# Patient Record
Sex: Female | Born: 1990 | Race: Black or African American | Hispanic: No | Marital: Single | Smoking: Never smoker
Health system: Southern US, Community
[De-identification: ages and names within clinical notes are randomized; demographics above are authoritative.]

## PROBLEM LIST (undated history)

## (undated) ENCOUNTER — Emergency Department: Admission: EM | Payer: MEDICAID | Source: Ambulatory Visit

## (undated) DIAGNOSIS — G43909 Migraine, unspecified, not intractable, without status migrainosus: Secondary | ICD-10-CM

## (undated) DIAGNOSIS — Z9289 Personal history of other medical treatment: Secondary | ICD-10-CM

## (undated) DIAGNOSIS — D571 Sickle-cell disease without crisis: Secondary | ICD-10-CM

## (undated) DIAGNOSIS — D649 Anemia, unspecified: Secondary | ICD-10-CM

## (undated) DIAGNOSIS — O1490 Unspecified pre-eclampsia, unspecified trimester: Secondary | ICD-10-CM

## (undated) DIAGNOSIS — O24419 Gestational diabetes mellitus in pregnancy, unspecified control: Secondary | ICD-10-CM

## (undated) DIAGNOSIS — I509 Heart failure, unspecified: Secondary | ICD-10-CM

## (undated) HISTORY — DX: Heart failure, unspecified: I50.9

## (undated) HISTORY — DX: Personal history of other medical treatment: Z92.89

## (undated) HISTORY — DX: Anemia, unspecified: D64.9

## (undated) HISTORY — DX: Sickle-cell disease without crisis: D57.1

## (undated) HISTORY — DX: Migraine, unspecified, not intractable, without status migrainosus: G43.909

---

## 2008-07-23 ENCOUNTER — Encounter: Payer: Self-pay | Admitting: Cardiology

## 2009-10-19 ENCOUNTER — Ambulatory Visit: Payer: Self-pay | Admitting: Dermatology

## 2009-11-01 ENCOUNTER — Ambulatory Visit
Admit: 2009-11-01 | Discharge: 2009-11-01 | Disposition: A | Discharge status: Home / Self Care | Payer: Self-pay | Source: Ambulatory Visit | Admitting: Physician Assistant

## 2009-11-04 LAB — STREP A CULTURE, THROAT

## 2009-11-07 ENCOUNTER — Ambulatory Visit
Admit: 2009-11-07 | Discharge: 2009-11-07 | Disposition: A | Discharge status: Home / Self Care | Payer: Self-pay | Source: Ambulatory Visit | Admitting: Women's Health

## 2009-11-09 LAB — AEROBIC CULTURE

## 2009-11-14 LAB — CHLAMYDIA PLASMID DNA AMPLIFICATION: Chlamydia Plasmid DNA Amplification: DETECTED

## 2009-11-14 LAB — N. GONORRHOEAE DNA AMPLIFICATION: N. gonorrhoeae DNA Amplification: NOT DETECTED

## 2010-01-25 ENCOUNTER — Ambulatory Visit
Admit: 2010-01-25 | Discharge: 2010-01-25 | Disposition: A | Discharge status: Home / Self Care | Payer: Self-pay | Source: Ambulatory Visit | Attending: Women's Health | Admitting: Women's Health

## 2010-01-28 LAB — N. GONORRHOEAE DNA AMPLIFICATION: N. gonorrhoeae DNA Amplification: NOT DETECTED

## 2010-01-28 LAB — CHLAMYDIA PLASMID DNA AMPLIFICATION: Chlamydia Plasmid DNA Amplification: NOT DETECTED

## 2010-04-22 ENCOUNTER — Ambulatory Visit
Admit: 2010-04-22 | Discharge: 2010-04-22 | Disposition: A | Discharge status: Home / Self Care | Payer: Self-pay | Source: Ambulatory Visit | Attending: Women's Health | Admitting: Women's Health

## 2010-04-23 LAB — N. GONORRHOEAE DNA AMPLIFICATION: N. gonorrhoeae DNA Amplification: 0

## 2010-04-23 LAB — CHLAMYDIA PLASMID DNA AMPLIFICATION: Chlamydia Plasmid DNA Amplification: 0

## 2010-08-01 ENCOUNTER — Ambulatory Visit
Admit: 2010-08-01 | Discharge: 2010-08-01 | Disposition: A | Discharge status: Home / Self Care | Payer: Self-pay | Source: Ambulatory Visit | Attending: Women's Health | Admitting: Women's Health

## 2010-08-02 LAB — CHLAMYDIA PLASMID DNA AMPLIFICATION: Chlamydia Plasmid DNA Amplification: 0

## 2010-08-02 LAB — N. GONORRHOEAE DNA AMPLIFICATION: N. gonorrhoeae DNA Amplification: 0

## 2010-09-13 ENCOUNTER — Ambulatory Visit
Admit: 2010-09-13 | Discharge: 2010-09-13 | Disposition: A | Discharge status: Home / Self Care | Payer: Self-pay | Source: Ambulatory Visit | Attending: Women's Health | Admitting: Women's Health

## 2010-09-16 LAB — CHLAMYDIA PLASMID DNA AMPLIFICATION: Chlamydia Plasmid DNA Amplification: 0

## 2010-09-16 LAB — N. GONORRHOEAE DNA AMPLIFICATION: N. gonorrhoeae DNA Amplification: 0

## 2010-10-07 ENCOUNTER — Ambulatory Visit
Admit: 2010-10-07 | Discharge: 2010-10-07 | Disposition: A | Discharge status: Home / Self Care | Payer: Self-pay | Source: Ambulatory Visit | Attending: Women's Health | Admitting: Women's Health

## 2010-10-09 LAB — AEROBIC CULTURE: Aerobic Culture: 0

## 2010-11-06 ENCOUNTER — Ambulatory Visit
Admit: 2010-11-06 | Discharge: 2010-11-06 | Disposition: A | Discharge status: Home / Self Care | Payer: Self-pay | Source: Ambulatory Visit | Attending: Women's Health | Admitting: Women's Health

## 2010-11-07 LAB — AEROBIC CULTURE: Aerobic Culture: 0

## 2010-12-02 ENCOUNTER — Ambulatory Visit
Admit: 2010-12-02 | Discharge: 2010-12-02 | Disposition: A | Discharge status: Home / Self Care | Payer: Self-pay | Source: Ambulatory Visit | Attending: Family | Admitting: Family

## 2010-12-03 LAB — VAGINITIS SCREEN: DNA PROBE: Vaginitis Screen:DNA Probe: 0

## 2010-12-03 LAB — N. GONORRHOEAE DNA AMPLIFICATION: N. gonorrhoeae DNA Amplification: 0

## 2010-12-03 LAB — CHLAMYDIA PLASMID DNA AMPLIFICATION: Chlamydia Plasmid DNA Amplification: 0

## 2011-01-22 ENCOUNTER — Ambulatory Visit
Admit: 2011-01-22 | Discharge: 2011-01-22 | Disposition: A | Discharge status: Home / Self Care | Payer: Self-pay | Source: Ambulatory Visit | Attending: Women's Health | Admitting: Women's Health

## 2011-01-23 LAB — AEROBIC CULTURE: Aerobic Culture: 0

## 2011-06-24 ENCOUNTER — Emergency Department
Admission: EM | Admit: 2011-06-24 | Discharge status: Against Medical Advice | Payer: Self-pay | Source: Ambulatory Visit
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
## 2011-10-21 ENCOUNTER — Ambulatory Visit
Admit: 2011-10-21 | Discharge: 2011-10-21 | Disposition: A | Discharge status: Home / Self Care | Payer: Self-pay | Source: Ambulatory Visit | Attending: Urgent Care | Admitting: Urgent Care

## 2011-10-22 LAB — N. GONORRHOEAE DNA AMPLIFICATION: N. gonorrhoeae DNA Amplification: 0

## 2011-10-22 LAB — AEROBIC CULTURE: Aerobic Culture: 0

## 2011-10-22 LAB — VAGINITIS SCREEN: DNA PROBE: Vaginitis Screen:DNA Probe: 0

## 2011-10-22 LAB — CHLAMYDIA PLASMID DNA AMPLIFICATION: Chlamydia Plasmid DNA Amplification: 0

## 2011-12-19 ENCOUNTER — Ambulatory Visit
Admit: 2011-12-19 | Discharge: 2011-12-19 | Disposition: A | Discharge status: Home / Self Care | Payer: Self-pay | Source: Ambulatory Visit | Admitting: Midwife

## 2011-12-21 LAB — AEROBIC CULTURE

## 2012-01-05 ENCOUNTER — Other Ambulatory Visit: Payer: Self-pay | Admitting: Gastroenterology

## 2012-01-05 ENCOUNTER — Encounter: Payer: Self-pay | Admitting: Physician Assistant

## 2012-01-05 ENCOUNTER — Emergency Department
Admission: EM | Admit: 2012-01-05 | Disposition: A | Discharge status: Home / Self Care | Payer: Self-pay | Source: Ambulatory Visit

## 2012-01-05 LAB — POCT URINALYSIS DIPSTICK
Blood,UA POCT: NEGATIVE
Glucose,UA POCT: NORMAL
Ketones,UA POCT: NEGATIVE
Lot #: 21612002
Nitrite,UA POCT: NEGATIVE
PH,UA POCT: 7 (ref 5–8)

## 2012-01-05 LAB — POCT URINE PREGNANCY: Lot #: 211712

## 2012-01-05 MED ORDER — KETOROLAC TROMETHAMINE 30 MG/ML IJ SOLN *I*
30.0000 mg | Freq: Once | INTRAMUSCULAR | Status: AC
Start: 2012-01-05 — End: 2012-01-05
  Administered 2012-01-05: 30 mg via INTRAVENOUS

## 2012-01-05 MED ORDER — IBUPROFEN 800 MG PO TABS *I*
800.0000 mg | ORAL_TABLET | Freq: Three times a day (TID) | ORAL | Status: AC | PRN
Start: 2012-01-05 — End: 2012-02-04

## 2012-01-05 MED ORDER — KETOROLAC TROMETHAMINE 30 MG/ML IJ SOLN *I*
30.0000 mg | Freq: Once | INTRAMUSCULAR | Status: DC
Start: 2012-01-05 — End: 2012-01-05
  Filled 2012-01-05: qty 1

## 2012-01-05 MED ORDER — GUAIFENESIN 100 MG/5ML PO SYRP
200.0000 mg | ORAL_SOLUTION | Freq: Four times a day (QID) | ORAL | Status: AC | PRN
Start: 2012-01-05 — End: 2012-02-04

## 2012-01-10 LAB — EKG 12-LEAD
P: 44 degrees
QRS: 59 degrees
Rate: 63 {beats}/min
Severity: NORMAL
Severity: NORMAL
T: 55 degrees

## 2012-06-24 ENCOUNTER — Ambulatory Visit
Admit: 2012-06-24 | Discharge: 2012-06-24 | Disposition: A | Discharge status: Home / Self Care | Payer: Self-pay | Source: Ambulatory Visit | Attending: Midwife | Admitting: Midwife

## 2012-06-25 LAB — AEROBIC CULTURE: Aerobic Culture: 0

## 2012-06-25 LAB — N. GONORRHOEAE DNA AMPLIFICATION: N. gonorrhoeae DNA Amplification: 0

## 2012-06-25 LAB — CHLAMYDIA PLASMID DNA AMPLIFICATION: Chlamydia Plasmid DNA Amplification: 0

## 2012-08-30 ENCOUNTER — Ambulatory Visit
Admit: 2012-08-30 | Discharge: 2012-08-30 | Disposition: A | Discharge status: Home / Self Care | Payer: Self-pay | Source: Ambulatory Visit | Attending: Midwife | Admitting: Midwife

## 2012-08-31 LAB — AEROBIC CULTURE

## 2012-08-31 LAB — N. GONORRHOEAE DNA AMPLIFICATION: N. gonorrhoeae DNA Amplification: 0

## 2012-08-31 LAB — CHLAMYDIA PLASMID DNA AMPLIFICATION: Chlamydia Plasmid DNA Amplification: 0

## 2012-10-08 ENCOUNTER — Ambulatory Visit
Admit: 2012-10-08 | Discharge: 2012-10-08 | Disposition: A | Discharge status: Home / Self Care | Payer: Self-pay | Source: Ambulatory Visit | Attending: Family Medicine | Admitting: Family Medicine

## 2012-10-09 LAB — VAGINITIS SCREEN: DNA PROBE: Vaginitis Screen:DNA Probe: POSITIVE

## 2012-10-11 LAB — TRICHOMONAS CULTURE

## 2012-10-12 LAB — CHLAMYDIA PLASMID DNA AMPLIFICATION: Chlamydia Plasmid DNA Amplification: 0

## 2012-10-12 LAB — N. GONORRHOEAE DNA AMPLIFICATION: N. gonorrhoeae DNA Amplification: 0

## 2013-04-06 ENCOUNTER — Ambulatory Visit
Admit: 2013-04-06 | Discharge: 2013-04-06 | Disposition: A | Discharge status: Home / Self Care | Payer: Self-pay | Source: Ambulatory Visit | Attending: Pediatrics | Admitting: Pediatrics

## 2013-04-07 LAB — VAGINITIS SCREEN: DNA PROBE: Vaginitis Screen:DNA Probe: 0

## 2013-04-08 LAB — CHLAMYDIA PLASMID DNA AMPLIFICATION: Chlamydia Plasmid DNA Amplification: 0

## 2013-04-08 LAB — N. GONORRHOEAE DNA AMPLIFICATION: N. gonorrhoeae DNA Amplification: 0

## 2013-04-12 LAB — GYN CYTOLOGY

## 2013-06-10 ENCOUNTER — Ambulatory Visit
Admit: 2013-06-10 | Discharge: 2013-06-10 | Disposition: A | Discharge status: Home / Self Care | Payer: Self-pay | Source: Ambulatory Visit | Attending: Family Medicine | Admitting: Family Medicine

## 2013-06-14 LAB — N. GONORRHOEAE DNA AMPLIFICATION: N. gonorrhoeae DNA Amplification: 0

## 2013-06-14 LAB — CHLAMYDIA PLASMID DNA AMPLIFICATION: Chlamydia Plasmid DNA Amplification: 0

## 2013-09-20 ENCOUNTER — Ambulatory Visit
Admit: 2013-09-20 | Discharge: 2013-09-20 | Disposition: A | Discharge status: Home / Self Care | Payer: Self-pay | Source: Ambulatory Visit | Attending: Pediatrics | Admitting: Pediatrics

## 2013-09-21 LAB — N. GONORRHOEAE DNA AMPLIFICATION: N. gonorrhoeae DNA Amplification: 0

## 2013-09-21 LAB — CHLAMYDIA PLASMID DNA AMPLIFICATION: Chlamydia Plasmid DNA Amplification: 0

## 2013-10-28 ENCOUNTER — Ambulatory Visit
Admit: 2013-10-28 | Discharge: 2013-10-28 | Disposition: A | Discharge status: Home / Self Care | Payer: Self-pay | Source: Ambulatory Visit | Attending: Pediatrics | Admitting: Pediatrics

## 2013-10-29 LAB — SYPHILIS SCREEN
Syphilis Screen: NEGATIVE
Syphilis Status: NONREACTIVE

## 2013-10-29 LAB — VAGINITIS SCREEN: DNA PROBE: Vaginitis Screen:DNA Probe: 0

## 2013-10-29 LAB — HIV 1&2 ANTIGEN/ANTIBODY: HIV 1&2 ANTIGEN/ANTIBODY: NONREACTIVE

## 2013-10-31 LAB — CHLAMYDIA PLASMID DNA AMPLIFICATION: Chlamydia Plasmid DNA Amplification: 0

## 2013-10-31 LAB — N. GONORRHOEAE DNA AMPLIFICATION: N. gonorrhoeae DNA Amplification: 0

## 2013-11-02 LAB — GYN CYTOLOGY

## 2014-03-06 ENCOUNTER — Ambulatory Visit
Admit: 2014-03-06 | Discharge: 2014-03-06 | Disposition: A | Discharge status: Home / Self Care | Payer: Self-pay | Source: Ambulatory Visit | Admitting: Pediatrics

## 2014-03-07 LAB — VAGINITIS SCREEN: DNA PROBE: Vaginitis Screen:DNA Probe: POSITIVE

## 2014-03-08 LAB — N. GONORRHOEAE DNA AMPLIFICATION: N. gonorrhoeae DNA Amplification: 0

## 2014-03-08 LAB — CHLAMYDIA PLASMID DNA AMPLIFICATION: Chlamydia Plasmid DNA Amplification: 0

## 2014-04-24 ENCOUNTER — Ambulatory Visit
Admit: 2014-04-24 | Discharge: 2014-04-24 | Disposition: A | Discharge status: Home / Self Care | Payer: Self-pay | Source: Ambulatory Visit | Attending: Gastroenterology | Admitting: Gastroenterology

## 2014-04-25 LAB — N. GONORRHOEAE DNA AMPLIFICATION: N. gonorrhoeae DNA Amplification: 0

## 2014-04-25 LAB — AEROBIC CULTURE: Aerobic Culture: 0

## 2014-04-25 LAB — CHLAMYDIA PLASMID DNA AMPLIFICATION: Chlamydia Plasmid DNA Amplification: 0

## 2014-04-25 LAB — VAGINITIS SCREEN: DNA PROBE: Vaginitis Screen:DNA Probe: 0

## 2014-05-29 ENCOUNTER — Ambulatory Visit
Admit: 2014-05-29 | Discharge: 2014-05-29 | Disposition: A | Discharge status: Home / Self Care | Payer: Self-pay | Source: Ambulatory Visit | Attending: Pediatrics | Admitting: Pediatrics

## 2014-05-30 LAB — CHLAMYDIA PLASMID DNA AMPLIFICATION: Chlamydia Plasmid DNA Amplification: 0

## 2014-05-30 LAB — VAGINITIS SCREEN: DNA PROBE: Vaginitis Screen:DNA Probe: 0

## 2014-05-30 LAB — N. GONORRHOEAE DNA AMPLIFICATION: N. gonorrhoeae DNA Amplification: 0

## 2014-12-11 ENCOUNTER — Ambulatory Visit
Admit: 2014-12-11 | Discharge: 2014-12-11 | Disposition: A | Discharge status: Home / Self Care | Payer: Self-pay | Source: Ambulatory Visit | Attending: Nurse Practitioner | Admitting: Nurse Practitioner

## 2014-12-11 LAB — VAGINITIS SCREEN: DNA PROBE: Vaginitis Screen:DNA Probe: 0

## 2014-12-13 LAB — N. GONORRHOEAE DNA AMPLIFICATION: N. gonorrhoeae DNA Amplification: 0

## 2014-12-13 LAB — CHLAMYDIA PLASMID DNA AMPLIFICATION: Chlamydia Plasmid DNA Amplification: 0

## 2015-05-03 ENCOUNTER — Ambulatory Visit
Admit: 2015-05-03 | Discharge: 2015-05-03 | Disposition: A | Discharge status: Home / Self Care | Payer: Self-pay | Source: Ambulatory Visit | Attending: Family Medicine | Admitting: Family Medicine

## 2015-05-04 LAB — N. GONORRHOEAE DNA AMPLIFICATION: N. gonorrhoeae DNA Amplification: 0

## 2015-05-04 LAB — CHLAMYDIA PLASMID DNA AMPLIFICATION: Chlamydia Plasmid DNA Amplification: 0

## 2015-05-04 LAB — AEROBIC CULTURE: Aerobic Culture: 0

## 2015-06-12 ENCOUNTER — Ambulatory Visit
Admit: 2015-06-12 | Discharge: 2015-06-12 | Disposition: A | Discharge status: Home / Self Care | Payer: Self-pay | Source: Ambulatory Visit | Attending: Midwife | Admitting: Midwife

## 2015-06-13 LAB — CHLAMYDIA PLASMID DNA AMPLIFICATION: Chlamydia Plasmid DNA Amplification: 0

## 2015-06-13 LAB — N. GONORRHOEAE DNA AMPLIFICATION: N. gonorrhoeae DNA Amplification: 0

## 2015-08-16 ENCOUNTER — Ambulatory Visit
Admission: RE | Admit: 2015-08-16 | Discharge: 2015-08-16 | Disposition: A | Discharge status: Home / Self Care | Payer: Self-pay | Source: Ambulatory Visit | Attending: Obstetrics and Gynecology | Admitting: Obstetrics and Gynecology

## 2015-08-17 LAB — AEROBIC CULTURE: Aerobic Culture: 0

## 2015-09-17 ENCOUNTER — Ambulatory Visit
Admission: RE | Admit: 2015-09-17 | Discharge: 2015-09-17 | Disposition: A | Discharge status: Home / Self Care | Payer: Self-pay | Source: Ambulatory Visit | Attending: Obstetrics and Gynecology | Admitting: Obstetrics and Gynecology

## 2015-09-18 LAB — N. GONORRHOEAE DNA AMPLIFICATION: N. gonorrhoeae DNA Amplification: 0

## 2015-09-18 LAB — AEROBIC CULTURE: Aerobic Culture: 0

## 2015-09-18 LAB — VAGINITIS SCREEN: DNA PROBE: Vaginitis Screen:DNA Probe: POSITIVE

## 2015-09-18 LAB — CHLAMYDIA PLASMID DNA AMPLIFICATION: Chlamydia Plasmid DNA Amplification: 0

## 2015-09-18 LAB — TRICHOMONAS DNA AMPLIFICATION: Trichomonas DNA amplification: 0

## 2016-03-19 ENCOUNTER — Ambulatory Visit
Admission: RE | Admit: 2016-03-19 | Discharge: 2016-03-19 | Disposition: A | Discharge status: Home / Self Care | Payer: Self-pay | Source: Ambulatory Visit | Attending: Midwife | Admitting: Midwife

## 2016-03-20 LAB — CHLAMYDIA PLASMID DNA AMPLIFICATION: Chlamydia Plasmid DNA Amplification: 0

## 2016-03-20 LAB — VAGINITIS SCREEN: DNA PROBE
Vaginitis Screen:DNA Probe: POSITIVE — AB
Vaginitis Screen:DNA Probe: POSITIVE — AB

## 2016-03-20 LAB — N. GONORRHOEAE DNA AMPLIFICATION: N. gonorrhoeae DNA Amplification: 0

## 2016-03-20 LAB — TRICHOMONAS DNA AMPLIFICATION: Trichomonas DNA amplification: 0

## 2016-04-22 ENCOUNTER — Ambulatory Visit: Admit: 2016-04-22 | Discharge: 2016-04-22 | Disposition: A | Discharge status: Home / Self Care | Payer: Self-pay

## 2016-07-10 ENCOUNTER — Other Ambulatory Visit
Admission: RE | Admit: 2016-07-10 | Discharge: 2016-07-10 | Disposition: A | Discharge status: Home / Self Care | Payer: Self-pay | Source: Ambulatory Visit | Attending: Physician Assistant | Admitting: Physician Assistant

## 2016-07-11 LAB — N. GONORRHOEAE DNA AMPLIFICATION: N. gonorrhoeae DNA Amplification: 0

## 2016-07-11 LAB — VAGINITIS SCREEN: DNA PROBE: Vaginitis Screen:DNA Probe: 0

## 2016-07-11 LAB — CHLAMYDIA PLASMID DNA AMPLIFICATION: Chlamydia Plasmid DNA Amplification: 0

## 2016-07-15 ENCOUNTER — Encounter: Payer: Self-pay | Admitting: Gastroenterology

## 2016-07-28 ENCOUNTER — Encounter: Payer: Self-pay | Admitting: Surgery

## 2016-10-06 ENCOUNTER — Other Ambulatory Visit
Admission: RE | Admit: 2016-10-06 | Discharge: 2016-10-06 | Disposition: A | Discharge status: Home / Self Care | Payer: Self-pay | Source: Ambulatory Visit | Attending: Family Medicine | Admitting: Family Medicine

## 2016-10-06 LAB — VAGINITIS SCREEN: DNA PROBE: Vaginitis Screen:DNA Probe: POSITIVE — AB

## 2016-10-07 LAB — HEPATITIS B & C PROF
HBV Core Ab: NEGATIVE
HBV S Ab Quant: 0.99 m[IU]/mL
HBV S Ab: NEGATIVE
HBV S Ag: NEGATIVE
Hep C Ab: NEGATIVE

## 2016-10-07 LAB — SYPHILIS SCREEN
Syphilis Screen: NEGATIVE
Syphilis Status: NONREACTIVE

## 2016-10-07 LAB — HIV 1&2 ANTIGEN/ANTIBODY: HIV 1&2 ANTIGEN/ANTIBODY: NONREACTIVE

## 2016-10-07 LAB — N. GONORRHOEAE DNA AMPLIFICATION: N. gonorrhoeae DNA Amplification: 0

## 2016-10-07 LAB — TRICHOMONAS DNA AMPLIFICATION: Trichomonas DNA amplification: 0

## 2016-10-07 LAB — CHLAMYDIA PLASMID DNA AMPLIFICATION: Chlamydia Plasmid DNA Amplification: 0

## 2017-01-09 ENCOUNTER — Other Ambulatory Visit
Admission: RE | Admit: 2017-01-09 | Discharge: 2017-01-09 | Disposition: A | Discharge status: Home / Self Care | Payer: Self-pay | Source: Ambulatory Visit | Attending: Family Medicine | Admitting: Family Medicine

## 2017-01-12 LAB — CHLAMYDIA PLASMID DNA AMPLIFICATION: Chlamydia Plasmid DNA Amplification: 0

## 2017-01-12 LAB — N. GONORRHOEAE DNA AMPLIFICATION: N. gonorrhoeae DNA Amplification: 0

## 2017-01-12 LAB — TRICHOMONAS DNA AMPLIFICATION: Trichomonas DNA amplification: 0

## 2017-02-16 ENCOUNTER — Other Ambulatory Visit
Admission: RE | Admit: 2017-02-16 | Discharge: 2017-02-16 | Disposition: A | Discharge status: Home / Self Care | Payer: Medicaid Other | Source: Ambulatory Visit | Attending: Obstetrics and Gynecology | Admitting: Obstetrics and Gynecology

## 2017-02-16 DIAGNOSIS — Z113 Encounter for screening for infections with a predominantly sexual mode of transmission: Secondary | ICD-10-CM | POA: Insufficient documentation

## 2017-02-16 DIAGNOSIS — Z118 Encounter for screening for other infectious and parasitic diseases: Secondary | ICD-10-CM | POA: Insufficient documentation

## 2017-02-17 LAB — TRICHOMONAS DNA AMPLIFICATION: Trichomonas DNA amplification: 0

## 2017-02-17 LAB — N. GONORRHOEAE DNA AMPLIFICATION: N. gonorrhoeae DNA Amplification: 0

## 2017-02-17 LAB — CHLAMYDIA PLASMID DNA AMPLIFICATION: Chlamydia Plasmid DNA Amplification: 0

## 2017-04-09 ENCOUNTER — Other Ambulatory Visit
Admission: RE | Admit: 2017-04-09 | Discharge: 2017-04-09 | Disposition: A | Discharge status: Home / Self Care | Payer: Medicaid Other | Source: Ambulatory Visit | Attending: Emergency Medicine | Admitting: Emergency Medicine

## 2017-04-09 DIAGNOSIS — N760 Acute vaginitis: Secondary | ICD-10-CM | POA: Insufficient documentation

## 2017-04-10 LAB — AEROBIC CULTURE: Aerobic Culture: 0

## 2017-04-10 LAB — CHLAMYDIA PLASMID DNA AMPLIFICATION: Chlamydia Plasmid DNA Amplification: 0

## 2017-04-10 LAB — N. GONORRHOEAE DNA AMPLIFICATION: N. gonorrhoeae DNA Amplification: 0

## 2017-04-10 LAB — TRICHOMONAS DNA AMPLIFICATION: Trichomonas DNA amplification: 0

## 2017-05-05 ENCOUNTER — Other Ambulatory Visit
Admission: RE | Admit: 2017-05-05 | Discharge: 2017-05-05 | Disposition: A | Discharge status: Home / Self Care | Payer: Medicaid Other | Source: Ambulatory Visit | Attending: Midwife | Admitting: Midwife

## 2017-05-05 DIAGNOSIS — Z113 Encounter for screening for infections with a predominantly sexual mode of transmission: Secondary | ICD-10-CM | POA: Insufficient documentation

## 2017-05-05 DIAGNOSIS — Z118 Encounter for screening for other infectious and parasitic diseases: Secondary | ICD-10-CM | POA: Insufficient documentation

## 2017-05-07 LAB — CHLAMYDIA PLASMID DNA AMPLIFICATION: Chlamydia Plasmid DNA Amplification: 0

## 2017-05-07 LAB — N. GONORRHOEAE DNA AMPLIFICATION: N. gonorrhoeae DNA Amplification: 0

## 2017-05-08 LAB — TRICHOMONAS DNA AMPLIFICATION: Trichomonas DNA amplification: 0

## 2017-05-15 ENCOUNTER — Encounter: Payer: Self-pay | Admitting: Emergency Medicine

## 2017-05-15 ENCOUNTER — Emergency Department
Admission: EM | Admit: 2017-05-15 | Discharge: 2017-05-15 | Disposition: A | Discharge status: Home / Self Care | Payer: Medicaid Other | Source: Ambulatory Visit | Attending: Emergency Medicine | Admitting: Emergency Medicine

## 2017-05-15 ENCOUNTER — Emergency Department: Payer: Medicaid Other

## 2017-05-15 DIAGNOSIS — R2689 Other abnormalities of gait and mobility: Secondary | ICD-10-CM

## 2017-05-15 DIAGNOSIS — M79604 Pain in right leg: Secondary | ICD-10-CM | POA: Insufficient documentation

## 2017-05-15 DIAGNOSIS — Y998 Other external cause status: Secondary | ICD-10-CM | POA: Insufficient documentation

## 2017-05-15 DIAGNOSIS — Y92003 Bedroom of unspecified non-institutional (private) residence as the place of occurrence of the external cause: Secondary | ICD-10-CM | POA: Insufficient documentation

## 2017-05-15 DIAGNOSIS — M791 Myalgia: Secondary | ICD-10-CM

## 2017-05-15 DIAGNOSIS — W208XXA Other cause of strike by thrown, projected or falling object, initial encounter: Secondary | ICD-10-CM | POA: Insufficient documentation

## 2017-05-15 DIAGNOSIS — Y9389 Activity, other specified: Secondary | ICD-10-CM | POA: Insufficient documentation

## 2017-05-15 DIAGNOSIS — S8991XA Unspecified injury of right lower leg, initial encounter: Secondary | ICD-10-CM

## 2017-05-15 DIAGNOSIS — W19XXXA Unspecified fall, initial encounter: Secondary | ICD-10-CM

## 2017-05-15 MED ORDER — IBUPROFEN 400 MG PO TABS *I*
600.0000 mg | ORAL_TABLET | Freq: Once | ORAL | Status: AC
Start: 2017-05-15 — End: 2017-05-15
  Administered 2017-05-15: 600 mg via ORAL
  Filled 2017-05-15 (×2): qty 1

## 2017-05-15 NOTE — Discharge Instructions (Signed)
You were seen in the ED for R leg pain after a headboard fell on your leg.  Your exam is reassuring and your xrays show no fractures.  Use the cructhes as needed for pain.  Also use ibuprofen as needed for pain (info below).  Return to the ED if you have leg numbness or worsening pain.

## 2017-05-15 NOTE — ED Notes (Signed)
Assumed pt care from EMS at this time. Will continue to monitor and treat per MD orders.

## 2017-05-15 NOTE — ED Provider Notes (Addendum)
History     Chief Complaint   Patient presents with    Leg Injury     HPI Comments: Healthy 26 yo F presents with RLE pain after the headboard of the bed fell on her leg.  The headboard weights >100 lbs, but she was able to get it immediately off of her leg.  The board landed on the back of her lower R leg and she complains of stinging pain to the skin at the back of the calf and R calf soreness.  She denies swelling, numbness, and has taken no home medications for the pain. She states that the pain is worse with weight bearing and she can stand but cannot take more than a few steps. She denies skin injury.       History provided by:  Patient  Language interpreter used: No      History reviewed. No pertinent past medical history.     History reviewed. No pertinent surgical history.  History reviewed. No pertinent family history.    Social History    has no tobacco, alcohol, drug, and sexual activity history on file.    Living Situation     Questions Responses    Patient lives with Alone    Homeless     Caregiver for other family member     External Services     Employment     Domestic Violence Risk           Problem List   There is no problem list on file for this patient.      Review of Systems   Review of Systems   Constitutional: Negative for activity change, appetite change, chills, diaphoresis, fatigue, fever and unexpected weight change.   Respiratory: Negative for apnea, cough, choking, chest tightness, shortness of breath, wheezing and stridor.    Cardiovascular: Negative for chest pain, palpitations and leg swelling.   Musculoskeletal: Positive for gait problem and myalgias. Negative for arthralgias, back pain, joint swelling, neck pain and neck stiffness.   Skin: Negative for color change, pallor and rash.   Neurological: Negative for dizziness, tremors, seizures, syncope, facial asymmetry, speech difficulty, weakness, light-headedness, numbness and headaches.   Psychiatric/Behavioral: Negative for  agitation and confusion.       Physical Exam     ED Triage Vitals   BP Heart Rate Heart Rate (via Pulse Ox) Resp Temp Temp src SpO2 (Retired) O2 Device O2 Flow Rate   05/15/17 2005 05/15/17 2005 -- 05/15/17 2005 05/15/17 2005 05/15/17 2107 05/15/17 2005 -- --   110/68 74  16 37.4 C (99.3 F) TEMPORAL 98 %        Weight           05/15/17 2005           84.8 kg (187 lb)                    Physical Exam   Constitutional: She is oriented to person, place, and time. She appears well-nourished. No distress.   HENT:   Head: Normocephalic and atraumatic.   Eyes: Pupils are equal, round, and reactive to light.   Neck: Neck supple.   Cardiovascular: Normal rate, regular rhythm, normal heart sounds and intact distal pulses.    No murmur heard.  Pulmonary/Chest: Effort normal and breath sounds normal. No respiratory distress.   Abdominal: Soft. Bowel sounds are normal. She exhibits no distension.   Musculoskeletal:   RLE: no skin breakage to back of  leg, TTP of posterior calf, muscle compartments soft, sensation intact and 2+ DP pulse present  Normal ROM of ankle, knee, hip and no effusions, no thigh tenderness,     Patellar and achilles reflex 2+ bilaterally.   Neurological: She is alert and oriented to person, place, and time.   Skin: Skin is warm and dry.   Nursing note and vitals reviewed.      Medical Decision Making        Initial Evaluation:  ED First Provider Contact     Date/Time Event User Comments    05/15/17 2014 ED First Provider Contact [facility] Initial Face to Face Provider Contact          Patient seen by me as above    Assessment:  26 y.o.female comes to the ED with RLE pain.  Her exam is most consistent with soft tissue contusion.  Fracture less likely given no anterior tib/fib tenderness, but she is unable to ambulate. Compartment syndrome is unlikely given soft compartments and neurovascularly intact, and she had no prolonged time with weight on her leg.  Rhabdomyolysis unlikely given no prolonged  crush time.    Xray shows no fractures.  Pain slightly improved with ibuprofen, given crutches and discharged with PCP f/u.  Return precautionis of worsening pain or new leg numbness.    Differential Diagnosis includes contusion, fracture, less likely compartment syndrome, crush injury, rhabdomyolysis                       Plan:   Diagnostic: R tib/fib xray  Therapeutic: ibuprofen, crutches  Dispo: discharge  Carly Nilda CalamityAllison Loner, MD           Bess HarvestLoner, Carly Allison, MD  Resident  05/15/17 2141          Patient seen by me today, 05/15/2017 at 8:25 pm    History:   I reviewed this patient, reviewed the resident note and agree     Exam:    I examined this patient, reviewed the resident note and agree     Decision Making:   I discussed with the documented resident decision making and agree    Murrell ReddenAuthor Judithann Villamar, MD           Kathryne HitchLu, Tailynn Armetta, MD  05/15/17 2251

## 2017-05-15 NOTE — ED Notes (Signed)
Pt ambulatory with crutches, provided by Clinical research associatewriter. Pt tolerating PO intake. Vital signs stable. Pt discharge instructions reviewed. Pt comfortable with discharge planning and verbalizes understanding of discharge instruction. Pt will follow up with PCP. Pt belongings with pt. Pt safe to discharge at this time. Pt having a friend pick her up

## 2017-05-15 NOTE — ED Notes (Signed)
Pt back in room.

## 2017-05-15 NOTE — ED Triage Notes (Signed)
pt has right leg injury from heavy wooden head board falling on her right lower leg, cms intact, denies other injury.    Triage Note   Ashok NorrisBrian Kersten Salmons, RN

## 2017-05-15 NOTE — ED Notes (Signed)
Pt off the unit for imaging

## 2017-05-18 NOTE — Comprehensive Assessment (Signed)
Patient DOB:  09-19-1991    Patient Address:  [address]  [place] [id number]    Patient Phone:  [phone] (home) [phone] (work)    Patient Insurance:      Patient PCP:  Alric RanAtalla, Nashat Safy, MD    Patient Appointment Calendar  [email or web address] @    Intervention Status:  DSRIP Intervention status: (P) Unsuccessful outreach  DSRIP Patient?: (P) Yes  Patient address confirmation: (P) Yes  Patient phone confirmation: (P) Yes  Patient insurance confirmation: (P) Yes  Patient FCM referred?: (P) No  Appt type: (P) No PCP appt made  [facility] application complete?: (P) No  PAM completed?: (P) No  Time Spent with Patient (min): (P) 10    CHW called patient to make follow up appointment with a PCP.  CHW left message and have not heard back from patient at this time.    Priscella MannJason Marlo Arriola   [facility] [employer] Worker  [phone]

## 2017-06-02 ENCOUNTER — Other Ambulatory Visit
Admission: RE | Admit: 2017-06-02 | Discharge: 2017-06-02 | Disposition: A | Discharge status: Home / Self Care | Payer: Medicaid Other | Source: Ambulatory Visit | Attending: Midwife | Admitting: Midwife

## 2017-06-02 DIAGNOSIS — Z124 Encounter for screening for malignant neoplasm of cervix: Secondary | ICD-10-CM | POA: Insufficient documentation

## 2017-06-02 DIAGNOSIS — Z113 Encounter for screening for infections with a predominantly sexual mode of transmission: Secondary | ICD-10-CM | POA: Insufficient documentation

## 2017-06-02 DIAGNOSIS — Z118 Encounter for screening for other infectious and parasitic diseases: Secondary | ICD-10-CM | POA: Insufficient documentation

## 2017-06-04 ENCOUNTER — Other Ambulatory Visit: Payer: Self-pay

## 2017-06-04 DIAGNOSIS — Z3490 Encounter for supervision of normal pregnancy, unspecified, unspecified trimester: Secondary | ICD-10-CM

## 2017-06-04 LAB — N. GONORRHOEAE DNA AMPLIFICATION: N. gonorrhoeae DNA Amplification: 0

## 2017-06-04 LAB — GYN CYTOLOGY

## 2017-06-04 LAB — CHLAMYDIA PLASMID DNA AMPLIFICATION: Chlamydia Plasmid DNA Amplification: 0

## 2017-06-04 LAB — TRICHOMONAS DNA AMPLIFICATION: Trichomonas DNA amplification: 0

## 2017-06-05 ENCOUNTER — Ambulatory Visit: Payer: Medicaid Other

## 2017-08-11 ENCOUNTER — Encounter: Payer: Self-pay | Admitting: Family Medicine

## 2017-08-11 ENCOUNTER — Ambulatory Visit: Payer: Medicaid Other | Admitting: Family Medicine

## 2017-10-05 ENCOUNTER — Other Ambulatory Visit
Admission: RE | Admit: 2017-10-05 | Discharge: 2017-10-05 | Disposition: A | Discharge status: Home / Self Care | Payer: Medicaid Other | Source: Ambulatory Visit | Attending: Emergency Medicine | Admitting: Emergency Medicine

## 2017-10-05 DIAGNOSIS — Z0189 Encounter for other specified special examinations: Secondary | ICD-10-CM | POA: Insufficient documentation

## 2017-10-06 LAB — VAGINITIS SCREEN: DNA PROBE
Vaginitis Screen:DNA Probe: POSITIVE — AB
Vaginitis Screen:DNA Probe: POSITIVE — AB

## 2017-10-07 LAB — CHLAMYDIA PLASMID DNA AMPLIFICATION: Chlamydia Plasmid DNA Amplification: 0

## 2017-10-07 LAB — N. GONORRHOEAE DNA AMPLIFICATION: N. gonorrhoeae DNA Amplification: 0

## 2017-10-07 LAB — AEROBIC CULTURE

## 2017-10-07 LAB — TRICHOMONAS DNA AMPLIFICATION: Trichomonas DNA amplification: 0

## 2017-11-11 ENCOUNTER — Other Ambulatory Visit
Admission: RE | Admit: 2017-11-11 | Discharge: 2017-11-11 | Disposition: A | Discharge status: Home / Self Care | Payer: Medicaid Other | Source: Ambulatory Visit | Attending: Family Medicine | Admitting: Family Medicine

## 2017-11-11 DIAGNOSIS — Z113 Encounter for screening for infections with a predominantly sexual mode of transmission: Secondary | ICD-10-CM | POA: Insufficient documentation

## 2017-11-11 DIAGNOSIS — Z118 Encounter for screening for other infectious and parasitic diseases: Secondary | ICD-10-CM | POA: Insufficient documentation

## 2017-11-12 LAB — CHLAMYDIA PLASMID DNA AMPLIFICATION: Chlamydia Plasmid DNA Amplification: 0

## 2017-11-12 LAB — N. GONORRHOEAE DNA AMPLIFICATION: N. gonorrhoeae DNA Amplification: 0

## 2017-11-12 LAB — TRICHOMONAS DNA AMPLIFICATION: Trichomonas DNA amplification: 0

## 2017-11-19 ENCOUNTER — Other Ambulatory Visit: Payer: Self-pay

## 2017-11-19 DIAGNOSIS — R102 Pelvic and perineal pain: Secondary | ICD-10-CM

## 2017-11-20 ENCOUNTER — Ambulatory Visit: Payer: Medicaid Other

## 2018-01-01 ENCOUNTER — Other Ambulatory Visit
Admission: RE | Admit: 2018-01-01 | Discharge: 2018-01-01 | Disposition: A | Discharge status: Home / Self Care | Payer: MEDICAID | Source: Ambulatory Visit | Attending: Family Medicine | Admitting: Family Medicine

## 2018-01-01 DIAGNOSIS — Z114 Encounter for screening for human immunodeficiency virus [HIV]: Secondary | ICD-10-CM | POA: Insufficient documentation

## 2018-01-02 LAB — N. GONORRHOEAE DNA AMPLIFICATION: N. gonorrhoeae DNA Amplification: 0

## 2018-01-02 LAB — VAGINITIS SCREEN: DNA PROBE: Vaginitis Screen:DNA Probe: POSITIVE — AB

## 2018-01-02 LAB — CHLAMYDIA PLASMID DNA AMPLIFICATION: Chlamydia Plasmid DNA Amplification: 0

## 2018-01-06 ENCOUNTER — Other Ambulatory Visit
Admission: RE | Admit: 2018-01-06 | Discharge: 2018-01-06 | Disposition: A | Discharge status: Home / Self Care | Payer: MEDICAID | Source: Ambulatory Visit | Attending: Family Medicine | Admitting: Family Medicine

## 2018-01-06 DIAGNOSIS — N9419 Other specified dyspareunia: Secondary | ICD-10-CM | POA: Insufficient documentation

## 2018-01-06 LAB — CBC AND DIFFERENTIAL
Baso # K/uL: 0 10*3/uL (ref 0.0–0.1)
Basophil %: 0.3 %
Eos # K/uL: 0.2 10*3/uL (ref 0.0–0.4)
Eosinophil %: 2.5 %
Hematocrit: 39 % (ref 34–45)
Hemoglobin: 13 g/dL (ref 11.2–15.7)
IMM Granulocytes #: 0 10*3/uL
IMM Granulocytes: 0.3 %
Lymph # K/uL: 1.5 10*3/uL (ref 1.2–3.7)
Lymphocyte %: 24.4 %
MCH: 33 pg/cell — ABNORMAL HIGH (ref 26–32)
MCHC: 33 g/dL (ref 32–36)
MCV: 97 fL — ABNORMAL HIGH (ref 79–95)
Mono # K/uL: 0.6 10*3/uL (ref 0.2–0.9)
Monocyte %: 8.9 %
Neut # K/uL: 4 10*3/uL (ref 1.6–6.1)
Nucl RBC # K/uL: 0 10*3/uL (ref 0.0–0.0)
Nucl RBC %: 0 /100 WBC (ref 0.0–0.2)
Platelets: 236 10*3/uL (ref 160–370)
RBC: 4 MIL/uL (ref 3.9–5.2)
RDW: 12.2 % (ref 11.7–14.4)
Seg Neut %: 63.6 %
WBC: 6.3 10*3/uL (ref 4.0–10.0)

## 2018-01-06 LAB — COMPREHENSIVE METABOLIC PANEL
ALT: 16 U/L (ref 0–35)
AST: 20 U/L (ref 0–35)
Albumin: 4.2 g/dL (ref 3.5–5.2)
Alk Phos: 49 U/L (ref 35–105)
Anion Gap: 10 (ref 7–16)
Bilirubin,Total: 0.4 mg/dL (ref 0.0–1.2)
CO2: 27 mmol/L (ref 20–28)
Calcium: 9.1 mg/dL (ref 8.8–10.2)
Chloride: 104 mmol/L (ref 96–108)
Creatinine: 0.81 mg/dL (ref 0.51–0.95)
GFR,Black: 114 *
GFR,Caucasian: 99 *
Glucose: 89 mg/dL (ref 60–99)
Lab: 10 mg/dL (ref 6–20)
Potassium: 4.3 mmol/L (ref 3.3–5.1)
Sodium: 141 mmol/L (ref 133–145)
Total Protein: 7.6 g/dL (ref 6.3–7.7)

## 2018-01-06 LAB — TSH: TSH: 0.7 u[IU]/mL (ref 0.27–4.20)

## 2018-01-06 LAB — LUTEINIZING HORMONE: LH: 13 m[IU]/mL

## 2018-01-06 LAB — FOLLICLE STIMULATING HORMONE: FSH: 5.9 m[IU]/mL

## 2018-01-07 LAB — DHEA-SULFATE

## 2018-01-10 LAB — LC/MS F&T TESTOSTERONE
TESTOSTERONE,FREE: 0.49 ng/dL (ref 0.06–1.06)
Testosterone,LC-MS/MS: 29 ng/dL (ref 8–60)

## 2018-01-10 LAB — DHEA: DHEA: 3.22 ng/mL (ref 1.330–7.780)

## 2018-02-08 ENCOUNTER — Other Ambulatory Visit
Admission: RE | Admit: 2018-02-08 | Discharge: 2018-02-08 | Disposition: A | Discharge status: Home / Self Care | Payer: MEDICAID | Source: Ambulatory Visit | Attending: Obstetrics and Gynecology | Admitting: Obstetrics and Gynecology

## 2018-02-08 DIAGNOSIS — B379 Candidiasis, unspecified: Secondary | ICD-10-CM | POA: Insufficient documentation

## 2018-02-11 LAB — N. GONORRHOEAE DNA AMPLIFICATION: N. gonorrhoeae DNA Amplification: 0

## 2018-02-11 LAB — CHLAMYDIA PLASMID DNA AMPLIFICATION: Chlamydia Plasmid DNA Amplification: 0

## 2018-02-15 ENCOUNTER — Encounter: Payer: Self-pay | Admitting: Emergency Medicine

## 2018-02-15 ENCOUNTER — Emergency Department
Admission: EM | Admit: 2018-02-15 | Discharge: 2018-02-15 | Disposition: A | Discharge status: Home / Self Care | Payer: MEDICAID | Source: Ambulatory Visit | Attending: Emergency Medicine | Admitting: Emergency Medicine

## 2018-02-15 DIAGNOSIS — O26891 Other specified pregnancy related conditions, first trimester: Secondary | ICD-10-CM

## 2018-02-15 DIAGNOSIS — R109 Unspecified abdominal pain: Secondary | ICD-10-CM

## 2018-02-15 DIAGNOSIS — Z3A00 Weeks of gestation of pregnancy not specified: Secondary | ICD-10-CM

## 2018-02-15 DIAGNOSIS — R110 Nausea: Secondary | ICD-10-CM | POA: Insufficient documentation

## 2018-02-15 DIAGNOSIS — R102 Pelvic and perineal pain: Secondary | ICD-10-CM

## 2018-02-15 DIAGNOSIS — R1031 Right lower quadrant pain: Secondary | ICD-10-CM

## 2018-02-15 DIAGNOSIS — Z3201 Encounter for pregnancy test, result positive: Secondary | ICD-10-CM | POA: Insufficient documentation

## 2018-02-15 LAB — CBC AND DIFFERENTIAL
Baso # K/uL: 0 10*3/uL (ref 0.0–0.1)
Basophil %: 0.2 %
Eos # K/uL: 0.1 10*3/uL (ref 0.0–0.4)
Eosinophil %: 2.3 %
Hematocrit: 35 % (ref 34–45)
Hemoglobin: 12.6 g/dL (ref 11.2–15.7)
IMM Granulocytes #: 0 10*3/uL
IMM Granulocytes: 0.4 %
Lymph # K/uL: 1.7 10*3/uL (ref 1.2–3.7)
Lymphocyte %: 29.8 %
MCH: 33 pg — ABNORMAL HIGH (ref 26–32)
MCHC: 36 g/dL (ref 32–36)
MCV: 92 fL (ref 79–95)
Mono # K/uL: 0.6 10*3/uL (ref 0.2–0.9)
Monocyte %: 10.7 %
Neut # K/uL: 3.2 10*3/uL (ref 1.6–6.1)
Nucl RBC # K/uL: 0 10*3/uL (ref 0.0–0.0)
Nucl RBC %: 0 /100 WBC (ref 0.0–0.2)
Platelets: 225 10*3/uL (ref 160–370)
RBC: 3.9 MIL/uL (ref 3.9–5.2)
RDW: 12 % (ref 11.7–14.4)
Seg Neut %: 56.6 %
WBC: 5.7 10*3/uL (ref 4.0–10.0)

## 2018-02-15 LAB — POCT URINALYSIS DIPSTICK
Blood,UA POCT: NEGATIVE
Glucose,UA POCT: NORMAL mg/dL
Ketones,UA POCT: NEGATIVE mg/dL
Leuk Esterase,UA POCT: NEGATIVE
Lot #: 32726002
Nitrite,UA POCT: NEGATIVE
PH,UA POCT: 5 (ref 5–8)

## 2018-02-15 LAB — POCT URINE PREGNANCY: Preg Test,UR POC: POSITIVE — AB

## 2018-02-15 LAB — BASIC METABOLIC PANEL
Anion Gap: 11 (ref 7–16)
CO2: 22 mmol/L (ref 20–28)
Calcium: 9.3 mg/dL (ref 8.8–10.2)
Chloride: 104 mmol/L (ref 96–108)
Creatinine: 0.66 mg/dL (ref 0.51–0.95)
GFR,Black: 138 *
GFR,Caucasian: 120 *
Glucose: 83 mg/dL (ref 60–99)
Lab: 7 mg/dL (ref 6–20)
Potassium: 4.5 mmol/L (ref 3.3–5.1)
Sodium: 137 mmol/L (ref 133–145)

## 2018-02-15 LAB — CHLAMYDIA PLASMID DNA AMPLIFICATION: Chlamydia Plasmid DNA Amplification: 0

## 2018-02-15 LAB — BHCG, QUANT PREGNANCY: BHCG, QUANT PREGNANCY: 21007 m[IU]/mL — ABNORMAL HIGH (ref 0–1)

## 2018-02-15 LAB — VAGINITIS SCREEN: DNA PROBE: Vaginitis Screen:DNA Probe: 0

## 2018-02-15 LAB — N. GONORRHOEAE DNA AMPLIFICATION: N. gonorrhoeae DNA Amplification: 0

## 2018-02-15 MED ORDER — DEXTROSE 5 % FLUSH FOR PUMPS *I*
0.0000 mL/h | INTRAVENOUS | Status: DC | PRN
Start: 2018-02-15 — End: 2018-02-15

## 2018-02-15 MED ORDER — SODIUM CHLORIDE 0.9 % FLUSH FOR PUMPS *I*
0.0000 mL/h | INTRAVENOUS | Status: DC | PRN
Start: 2018-02-15 — End: 2018-02-15

## 2018-02-15 NOTE — ED Provider Notes (Addendum)
History     Chief Complaint   Patient presents with    Emesis     Pt to ED with c/o cramping "off and on" and emesis that started yesterday.  LMP beginning of feb.    Abdominal Cramping     27 year old healthy female presents with lower abdominal cramping, onset in past few days, comes and goes, cramping across lower abdomen most noted in RLQ. Reports mild lower back pain as well. Thinks she may be pregnant, unsure to recall exact date of LMP, possibly end of January, beginning of February. Reports negative home pregnancy test about 2 weeks ago. Denies vaginal discharge or bleeding. Denies dysuria, urgency or frequency with urination. No fever, chills or change in bowel habits. Does report mild nausea without emesis and mild breast tenderness.         History provided by:  Patient      Medical/Surgical/Family History     History reviewed. No pertinent past medical history.     There is no problem list on file for this patient.           History reviewed. No pertinent surgical history.  No family history on file.       Social History   Substance Use Topics    Smoking status: Not on file    Smokeless tobacco: Not on file    Alcohol use Not on file     Living Situation     Questions Responses    Patient lives with Alone    Homeless     Caregiver for other family member     External Services     Employment     Domestic Violence Risk                 Review of Systems   Review of Systems   Constitutional: Negative for chills, fatigue and fever.   Respiratory: Negative for cough and shortness of breath.    Cardiovascular: Negative for chest pain.   Gastrointestinal: Positive for nausea. Negative for diarrhea and vomiting.   Genitourinary: Positive for pelvic pain. Negative for difficulty urinating, dysuria, flank pain, frequency, hematuria, urgency, vaginal bleeding and vaginal discharge.   Musculoskeletal: Positive for back pain.   Skin: Negative for rash and wound.   Neurological: Negative for syncope and  headaches.   Psychiatric/Behavioral: Negative for agitation and confusion.       Physical Exam     Triage Vitals  Triage Start: Start, (02/15/18 0955)   First Recorded BP: 124/58, Resp: 16, Temp: 36.1 C (97 F), Temp src: TEMPORAL Oxygen Therapy SpO2: 100 %, O2 Device: None (Room air), Heart Rate: 76, (02/15/18 0955)  .  First Pain Reported  0-10 Scale: 8, Pain Location/Orientation: Abdomen, (02/15/18 1610)       Physical Exam   Constitutional: She is oriented to person, place, and time. She appears well-developed and well-nourished. No distress.   HENT:   Head: Normocephalic and atraumatic.   Cardiovascular: Normal rate, regular rhythm and normal heart sounds.    Pulmonary/Chest: Effort normal and breath sounds normal.   Abdominal: Soft. Bowel sounds are normal.   Abdomen soft, very mild tenderness in suprapubic region, no rebound or guarding    Genitourinary: Cervix exhibits no motion tenderness, no discharge and no friability. Vaginal discharge: scant white discharge.   Neurological: She is alert and oriented to person, place, and time.   Skin: She is not diaphoretic.   Psychiatric: She has a normal mood  and affect.       Medical Decision Making        Initial Evaluation:  ED First Provider Contact     Date/Time Event User Comments    02/15/18 1123 ED First Provider Contact Golden HurterGILLETT, BREEANA Initial Face to Face Provider Contact          Patient seen by me on arrival date of 02/15/2018.    Assessment:  27 y.o.female comes to the ED with Lower abdominal cramping, nausea,  possible pregnancy       Differential Diagnosis includes:  Lower abdominal cramping, nausea, pt with positive urine pregnancy test, reports pain more severe in RLQ than left, abdominal exam benign and no vaginal bleeding, rule out ectopic pregnancy    Plan: CBC, BMP, Beta HCG, GC/Chlamydia culture, vaginitis screen, reassess          Golden HurterBreeana Gillett, PA      Labs Reviewed   CBC AND DIFFERENTIAL - Abnormal; Notable for the following:        Result  Value    MCH 33 (*)     All other components within normal limits   BHCG,SERUM - Abnormal; Notable for the following:     BHCG 21,007 (*)     All other components within normal limits   POCT URINALYSIS DIPSTICK - Abnormal; Notable for the following:     Protein,UA POCT +++ 500mg /dL (*)     All other components within normal limits   POCT URINE PREGNANCY - Abnormal; Notable for the following:     Preg Test,UR POC Positive (*)     All other components within normal limits   CHLAMYDIA PLASMID DNA AMPLIFICATION   N. GONORRHOEAE DNA AMPLIFICATION   VAGINITIS SCREEN: DNA PROBE   BASIC METABOLIC PANEL     Bedside us performed by Dr. Tonny BollmanSchueckler with noted intrauterine yolk sak  Will discharge home, follow up with OB/GYN, return precautions discussed   APP Review:    I had face-to-face interaction with the patient on 02/15/2018.    I was asked by APP to see this patient due to the complexity of the current medical presentation.      I have reviewed and agree with the above documentation and, in addition:    Our plan is Discharge with follow up with OB/Gyn.Marland Kitchen.    Author:  Clayton LefortJOHN Nashia Remus, DO       Golden HurterGillett, Breeana, GeorgiaPA  02/15/18 1438       Alee Katen, Jonny RuizJohn, DO  02/16/18 1143

## 2018-02-15 NOTE — ED Notes (Addendum)
Please dismiss prior note as it was written in error on wrong patient. Initial assessment, pt is complaining of abd pain low and unk if she is pregnant, placed Iv and sent bloodwork. Pt is tolerating well.

## 2018-02-15 NOTE — ED Triage Notes (Signed)
Pt to ED with c/o cramping "off and on" and emesis that started yesterday.  LMP beginning of feb.       Triage Note   Precious BardKristin C Kindel, RN

## 2018-02-15 NOTE — Discharge Instructions (Signed)
If you have worsening symptoms, vaginal bleeding, fever or new concerns return to the ER    Call OB/GYN to schedule a follow up appointment

## 2018-02-16 NOTE — ED Procedure Documentation (Signed)
Procedure: Emergency Medicine Limited Pelvic Ultrasound    Procedure performed by Madison LefortJOHN Perlie Scheuring, Madison Rose    Date: 02/15/2018   Time: 1430    Type Transabdominal    Indications   Evaluation for the following: Evaluation for intrauterine pregnancy, abdominal pain    Findings:   Exam limitations include the following: No limitations    Structures visualized include the following: uterus, cul-de-sac and other IUP  Visualization of fluid: No pelvic free fluid and Other IUP  Additional findings: Gestational sac:  .    Impression  Definite IUP  .        Images were interpreted by me, Madison LefortJOHN Bryndan Bilyk, Madison Rose  Images were archived to [facility] PACSProcedures   Procedures    [facility] HospitalJOHN Armando Lauman, Madison Rose     Madison Rose, Madison Rose, Madison Rose  02/16/18 1146

## 2018-02-19 ENCOUNTER — Other Ambulatory Visit
Admission: RE | Admit: 2018-02-19 | Discharge: 2018-02-19 | Disposition: A | Discharge status: Home / Self Care | Payer: MEDICAID | Source: Ambulatory Visit | Attending: Midwife | Admitting: Midwife

## 2018-02-19 ENCOUNTER — Other Ambulatory Visit: Payer: Self-pay

## 2018-02-19 ENCOUNTER — Ambulatory Visit: Payer: MEDICAID | Attending: Midwife

## 2018-02-19 DIAGNOSIS — Z3687 Encounter for antenatal screening for uncertain dates: Secondary | ICD-10-CM | POA: Insufficient documentation

## 2018-02-19 DIAGNOSIS — O34211 Maternal care for low transverse scar from previous cesarean delivery: Secondary | ICD-10-CM

## 2018-02-19 DIAGNOSIS — N925 Other specified irregular menstruation: Secondary | ICD-10-CM | POA: Insufficient documentation

## 2018-02-19 DIAGNOSIS — Z3490 Encounter for supervision of normal pregnancy, unspecified, unspecified trimester: Secondary | ICD-10-CM

## 2018-02-19 LAB — TYPE AND SCREEN
ABO RH Blood Type: O POS
Antibody Screen: NEGATIVE

## 2018-02-20 LAB — HEPATITIS B & C PROF
HBV Core Ab: NEGATIVE
HBV S Ab Quant: 1.02 m[IU]/mL
HBV S Ab: NEGATIVE
HBV S Ag: NEGATIVE
Hep C Ab: NEGATIVE

## 2018-02-20 LAB — HIV 1&2 ANTIGEN/ANTIBODY: HIV 1&2 ANTIGEN/ANTIBODY: NONREACTIVE

## 2018-02-21 LAB — PRENATAL PROFILE
Baso # K/uL: 0 10*3/uL (ref 0.0–0.1)
Basophil %: 0.1 %
Eos # K/uL: 0.1 10*3/uL (ref 0.0–0.4)
Eosinophil %: 1.4 %
Hematocrit: 35 % (ref 34–45)
Hemoglobin: 11.5 g/dL (ref 11.2–15.7)
IMM Granulocytes #: 0 10*3/uL
IMM Granulocytes: 0.3 %
Lymph # K/uL: 1.9 10*3/uL (ref 1.2–3.7)
Lymphocyte %: 28.1 %
MCH: 32 pg/cell (ref 26–32)
MCHC: 33 g/dL (ref 32–36)
MCV: 96 fL — ABNORMAL HIGH (ref 79–95)
Mono # K/uL: 0.7 10*3/uL (ref 0.2–0.9)
Monocyte %: 10.1 %
Neut # K/uL: 4.1 10*3/uL (ref 1.6–6.1)
Nucl RBC # K/uL: 0 10*3/uL (ref 0.0–0.0)
Nucl RBC %: 0 /100 WBC (ref 0.0–0.2)
Platelets: 214 10*3/uL (ref 160–370)
RBC: 3.6 MIL/uL — ABNORMAL LOW (ref 3.9–5.2)
RDW: 11.9 % (ref 11.7–14.4)
Rubella IgG AB: UNDETERMINED
Seg Neut %: 60 %
Syphilis Screen: NEGATIVE
Syphilis Status: NONREACTIVE
WBC: 6.9 10*3/uL (ref 4.0–10.0)

## 2018-02-22 LAB — LEAD VENOUS: Lead,Venous: <1 ug/dl (ref 0–5)

## 2018-02-22 LAB — LEAD, BLOOD

## 2018-06-23 LAB — UNMAPPED LAB RESULTS
Basophil # (HT): 0 10 3/uL — NL (ref 0.0–0.2)
Basophil % (HT): 0 % — NL (ref 0–3)
Eosinophil # (HT): 0 10 3/uL — NL (ref 0.0–0.6)
Eosinophil % (HT): 0 % — NL (ref 0–5)
Hematocrit (HT): 37 % — NL (ref 35–47)
Hemoglobin (HGB) (HT): 12.7 g/dL — NL (ref 12.0–16.0)
Lymphocyte # (HT): 0.8 10 3/uL — ABNORMAL LOW (ref 1.0–4.8)
Lymphocyte % (HT): 11 % — ABNORMAL LOW (ref 15–45)
MCHC (HT): 34.1 g/dL — NL (ref 31.0–37.5)
MCV (HT): 93 fL — NL (ref 80–100)
Mean Corpuscular Hemoglobin (MCH) (HT): 31.8 pg — NL (ref 26.0–34.0)
Monocyte # (HT): 0.2 10 3/uL — NL (ref 0.1–1.0)
Monocyte % (HT): 3 % — NL (ref 0–15)
Neutrophil # (HT): 5.9 10 3/uL — NL (ref 1.8–8.0)
Platelets (HT): 240 10 3/uL — NL (ref 150–450)
RBC (HT): 4 10 6/uL — NL (ref 3.80–5.20)
RDW (HT): 12.1 % — NL (ref 0.0–15.2)
Seg Neut % (HT): 85 % — ABNORMAL HIGH (ref 45–75)
WBC (HT): 7 10 3/uL — NL (ref 4.0–11.0)

## 2018-06-30 ENCOUNTER — Emergency Department (HOSPITAL_COMMUNITY)
Admission: EM | Admit: 2018-06-30 | Discharge: 2018-06-30 | Disposition: A | Discharge status: Home / Self Care | Payer: No Typology Code available for payment source | Attending: Emergency Medicine | Admitting: Emergency Medicine

## 2018-06-30 ENCOUNTER — Encounter (HOSPITAL_COMMUNITY): Payer: Self-pay | Admitting: Emergency Medicine

## 2018-06-30 DIAGNOSIS — Y999 Unspecified external cause status: Secondary | ICD-10-CM | POA: Diagnosis not present

## 2018-06-30 DIAGNOSIS — M7918 Myalgia, other site: Secondary | ICD-10-CM | POA: Insufficient documentation

## 2018-06-30 DIAGNOSIS — Y92410 Unspecified street and highway as the place of occurrence of the external cause: Secondary | ICD-10-CM | POA: Diagnosis not present

## 2018-06-30 LAB — POC URINE PREG, ED: Preg Test, Ur: NEGATIVE

## 2018-06-30 MED ORDER — CYCLOBENZAPRINE HCL 10 MG PO TABS: 10.0000 mg | ORAL_TABLET | Freq: Two times a day (BID) | ORAL | 0 refills | Status: DC | PRN

## 2018-06-30 MED ORDER — IBUPROFEN 600 MG PO TABS: 600.0000 mg | ORAL_TABLET | Freq: Four times a day (QID) | ORAL | 0 refills | Status: DC | PRN
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
## 2018-09-20 ENCOUNTER — Other Ambulatory Visit
Admission: RE | Admit: 2018-09-20 | Discharge: 2018-09-20 | Disposition: A | Discharge status: Home / Self Care | Payer: MEDICAID | Source: Ambulatory Visit | Attending: Physician Assistant | Admitting: Physician Assistant

## 2018-09-20 DIAGNOSIS — D573 Sickle-cell trait: Secondary | ICD-10-CM | POA: Insufficient documentation

## 2018-09-20 LAB — COMPREHENSIVE METABOLIC PANEL
ALT: 10 U/L (ref 0–35)
AST: 18 U/L (ref 0–35)
Albumin: 4.3 g/dL (ref 3.5–5.2)
Alk Phos: 54 U/L (ref 35–105)
Anion Gap: 10 (ref 7–16)
Bilirubin,Total: 0.3 mg/dL (ref 0.0–1.2)
CO2: 24 mmol/L (ref 20–28)
Calcium: 9.4 mg/dL (ref 8.8–10.2)
Chloride: 108 mmol/L (ref 96–108)
Creatinine: 0.81 mg/dL (ref 0.51–0.95)
GFR,Black: 113 *
GFR,Caucasian: 98 *
Glucose: 89 mg/dL (ref 60–99)
Lab: 7 mg/dL (ref 6–20)
Potassium: 4.7 mmol/L (ref 3.3–5.1)
Sodium: 142 mmol/L (ref 133–145)
Total Protein: 7.3 g/dL (ref 6.3–7.7)

## 2018-09-20 LAB — CBC AND DIFFERENTIAL
Baso # K/uL: 0 10*3/uL (ref 0.0–0.1)
Basophil %: 0.4 %
Eos # K/uL: 0.3 10*3/uL (ref 0.0–0.4)
Eosinophil %: 4.9 %
Hematocrit: 40 % (ref 34–45)
Hemoglobin: 12.9 g/dL (ref 11.2–15.7)
IMM Granulocytes #: 0 10*3/uL
IMM Granulocytes: 0.4 %
Lymph # K/uL: 1.9 10*3/uL (ref 1.2–3.7)
Lymphocyte %: 36 %
MCH: 32 pg/cell (ref 26–32)
MCHC: 33 g/dL (ref 32–36)
MCV: 98 fL — ABNORMAL HIGH (ref 79–95)
Mono # K/uL: 0.5 10*3/uL (ref 0.2–0.9)
Monocyte %: 10 %
Neut # K/uL: 2.6 10*3/uL (ref 1.6–6.1)
Nucl RBC # K/uL: 0 10*3/uL (ref 0.0–0.0)
Nucl RBC %: 0 /100 WBC (ref 0.0–0.2)
Platelets: 235 10*3/uL (ref 160–370)
RBC: 4.1 MIL/uL (ref 3.9–5.2)
RDW: 11.9 % (ref 11.7–14.4)
Seg Neut %: 48.3 %
WBC: 5.3 10*3/uL (ref 4.0–10.0)

## 2018-11-16 ENCOUNTER — Other Ambulatory Visit
Admission: RE | Admit: 2018-11-16 | Discharge: 2018-11-16 | Disposition: A | Discharge status: Home / Self Care | Payer: MEDICAID | Source: Ambulatory Visit | Attending: Family Medicine | Admitting: Family Medicine

## 2018-11-16 DIAGNOSIS — Z7251 High risk heterosexual behavior: Secondary | ICD-10-CM | POA: Insufficient documentation

## 2018-11-16 DIAGNOSIS — Z118 Encounter for screening for other infectious and parasitic diseases: Secondary | ICD-10-CM | POA: Insufficient documentation

## 2018-11-16 DIAGNOSIS — Z113 Encounter for screening for infections with a predominantly sexual mode of transmission: Secondary | ICD-10-CM | POA: Insufficient documentation

## 2018-11-17 LAB — CHLAMYDIA PLASMID DNA AMPLIFICATION: Chlamydia Plasmid DNA Amplification: 0

## 2018-11-17 LAB — TRICHOMONAS DNA AMPLIFICATION: Trichomonas DNA amplification: 0

## 2018-11-17 LAB — N. GONORRHOEAE DNA AMPLIFICATION: N. gonorrhoeae DNA Amplification: 0

## 2018-12-15 NOTE — L&D Delivery Note (Signed)
Delivery Note Pt progressed to complete and pushed well.  At 3:08 AM a viable female was delivered via Vaginal, Spontaneous (Presentation: vtx; ROA ).  APGAR: 7, 9; weight pending.   Placenta status: spontaneous, intact.  Cord:  with the following complications: none.   Anesthesia:  Epidural Episiotomy: None Lacerations: None;1st degree Suture Repair: 3.0 vicryl rapide Est. Blood Loss (mL):  100  Mom to postpartum.  Baby to Couplet care / Skin to Skin.  Will continue magnesium for around 24 hours  Clarene Duke 08/13/2019, 3:29 AM

## 2019-02-16 DIAGNOSIS — Z3490 Encounter for supervision of normal pregnancy, unspecified, unspecified trimester: Secondary | ICD-10-CM | POA: Insufficient documentation

## 2019-02-17 LAB — OB RESULTS CONSOLE RPR: RPR: NONREACTIVE

## 2019-02-17 LAB — OB RESULTS CONSOLE GC/CHLAMYDIA
Chlamydia: NEGATIVE
Gonorrhea: NEGATIVE

## 2019-02-17 LAB — OB RESULTS CONSOLE ANTIBODY SCREEN: Antibody Screen: NEGATIVE

## 2019-02-17 LAB — OB RESULTS CONSOLE RUBELLA ANTIBODY, IGM: Rubella: IMMUNE

## 2019-02-17 LAB — OB RESULTS CONSOLE ABO/RH: RH Type: POSITIVE

## 2019-02-17 LAB — OB RESULTS CONSOLE HIV ANTIBODY (ROUTINE TESTING): HIV: NONREACTIVE

## 2019-02-17 LAB — OB RESULTS CONSOLE HEPATITIS B SURFACE ANTIGEN: Hepatitis B Surface Ag: NEGATIVE

## 2019-05-26 DIAGNOSIS — O09293 Supervision of pregnancy with other poor reproductive or obstetric history, third trimester: Secondary | ICD-10-CM | POA: Diagnosis not present

## 2019-05-26 DIAGNOSIS — Z3A27 27 weeks gestation of pregnancy: Secondary | ICD-10-CM | POA: Diagnosis not present

## 2019-05-31 ENCOUNTER — Other Ambulatory Visit: Payer: Self-pay | Admitting: Emergency Medicine

## 2019-06-09 ENCOUNTER — Encounter: Payer: Self-pay | Admitting: Registered"

## 2019-06-09 ENCOUNTER — Encounter: Payer: Medicaid Other | Attending: Obstetrics and Gynecology | Admitting: Registered"

## 2019-06-09 ENCOUNTER — Other Ambulatory Visit: Payer: Self-pay

## 2019-06-09 DIAGNOSIS — O99810 Abnormal glucose complicating pregnancy: Secondary | ICD-10-CM

## 2019-06-09 NOTE — Progress Notes (Signed)
Patient was seen on 06/09/2019 for Gestational Diabetes self-management class at the [facility]. The following learning objectives were met by the patient during this course:   States the definition of Gestational Diabetes  States why dietary management is important in controlling blood glucose  Describes the effects each nutrient has on blood glucose levels  Demonstrates ability to create a balanced meal plan  Demonstrates carbohydrate counting   States when to check blood glucose levels  Demonstrates proper blood glucose monitoring techniques  States the effect of stress and exercise on blood glucose levels  States the importance of limiting caffeine and abstaining from alcohol and smoking  Blood glucose monitor given: Accu-chek Guide me Lot # [id number] Exp: 06/19/2020 Blood glucose reading: 66 mg/dL  Patient instructed to monitor glucose levels: FBS: 60 - <95; 1 hour: <140; 2 hour: <120  Patient received handouts:  Nutrition Diabetes and Pregnancy, including carb counting list  Patient will be seen for follow-up as needed.

## 2019-07-27 DIAGNOSIS — Z3A36 36 weeks gestation of pregnancy: Secondary | ICD-10-CM | POA: Diagnosis not present

## 2019-07-27 DIAGNOSIS — N3000 Acute cystitis without hematuria: Secondary | ICD-10-CM | POA: Diagnosis not present

## 2019-07-27 DIAGNOSIS — Z3403 Encounter for supervision of normal first pregnancy, third trimester: Secondary | ICD-10-CM | POA: Diagnosis not present

## 2019-07-28 LAB — OB RESULTS CONSOLE GBS: GBS: NEGATIVE

## 2019-08-11 ENCOUNTER — Inpatient Hospital Stay (HOSPITAL_COMMUNITY)
Admission: AD | Admit: 2019-08-11 | Discharge: 2019-08-16 | DRG: 807 | Disposition: A | Discharge status: Home / Self Care | Payer: No Typology Code available for payment source | Attending: Obstetrics and Gynecology | Admitting: Obstetrics and Gynecology

## 2019-08-11 ENCOUNTER — Encounter (HOSPITAL_COMMUNITY): Payer: Self-pay | Admitting: *Deleted

## 2019-08-11 ENCOUNTER — Other Ambulatory Visit: Payer: Self-pay

## 2019-08-11 DIAGNOSIS — O1493 Unspecified pre-eclampsia, third trimester: Secondary | ICD-10-CM | POA: Diagnosis present

## 2019-08-11 DIAGNOSIS — O1414 Severe pre-eclampsia complicating childbirth: Secondary | ICD-10-CM | POA: Diagnosis present

## 2019-08-11 DIAGNOSIS — O114 Pre-existing hypertension with pre-eclampsia, complicating childbirth: Principal | ICD-10-CM | POA: Diagnosis present

## 2019-08-11 DIAGNOSIS — O24429 Gestational diabetes mellitus in childbirth, unspecified control: Secondary | ICD-10-CM | POA: Diagnosis not present

## 2019-08-11 DIAGNOSIS — O24420 Gestational diabetes mellitus in childbirth, diet controlled: Secondary | ICD-10-CM | POA: Diagnosis present

## 2019-08-11 DIAGNOSIS — R000 Tachycardia, unspecified: Secondary | ICD-10-CM | POA: Diagnosis present

## 2019-08-11 DIAGNOSIS — O1002 Pre-existing essential hypertension complicating childbirth: Secondary | ICD-10-CM | POA: Diagnosis present

## 2019-08-11 DIAGNOSIS — O99214 Obesity complicating childbirth: Secondary | ICD-10-CM | POA: Diagnosis present

## 2019-08-11 DIAGNOSIS — Z3A38 38 weeks gestation of pregnancy: Secondary | ICD-10-CM | POA: Diagnosis not present

## 2019-08-11 DIAGNOSIS — O9989 Other specified diseases and conditions complicating pregnancy, childbirth and the puerperium: Secondary | ICD-10-CM | POA: Diagnosis present

## 2019-08-11 DIAGNOSIS — Z20828 Contact with and (suspected) exposure to other viral communicable diseases: Secondary | ICD-10-CM | POA: Diagnosis present

## 2019-08-11 DIAGNOSIS — O1494 Unspecified pre-eclampsia, complicating childbirth: Secondary | ICD-10-CM | POA: Diagnosis not present

## 2019-08-11 HISTORY — DX: Unspecified pre-eclampsia, unspecified trimester: O14.90

## 2019-08-11 HISTORY — DX: Gestational diabetes mellitus in pregnancy, unspecified control: O24.419

## 2019-08-11 LAB — COMPREHENSIVE METABOLIC PANEL
ALT: 15 U/L (ref 0–44)
AST: 24 U/L (ref 15–41)
Albumin: 2.9 g/dL — ABNORMAL LOW (ref 3.5–5.0)
Alkaline Phosphatase: 121 U/L (ref 38–126)
Anion gap: 10 (ref 5–15)
BUN: 8 mg/dL (ref 6–20)
CO2: 21 mmol/L — ABNORMAL LOW (ref 22–32)
Calcium: 9.8 mg/dL (ref 8.9–10.3)
Chloride: 105 mmol/L (ref 98–111)
Creatinine, Ser: 0.7 mg/dL (ref 0.44–1.00)
GFR calc Af Amer: >60 mL/min (ref >60)
GFR calc non Af Amer: >60 mL/min (ref >60)
Glucose, Bld: 86 mg/dL (ref 70–99)
Potassium: 4.2 mmol/L (ref 3.5–5.1)
Sodium: 136 mmol/L (ref 135–145)
Total Bilirubin: 0.6 mg/dL (ref 0.3–1.2)
Total Protein: 6.8 g/dL (ref 6.5–8.1)

## 2019-08-11 LAB — URINALYSIS, ROUTINE W REFLEX MICROSCOPIC
Bilirubin Urine: NEGATIVE
Glucose, UA: NEGATIVE mg/dL
Hgb urine dipstick: NEGATIVE
Ketones, ur: NEGATIVE mg/dL
Leukocytes,Ua: NEGATIVE
Nitrite: NEGATIVE
Protein, ur: 100 mg/dL — AB
Specific Gravity, Urine: 1.017 (ref 1.005–1.030)
pH: 7 (ref 5.0–8.0)

## 2019-08-11 LAB — PROTEIN / CREATININE RATIO, URINE
Creatinine, Urine: 107.11 mg/dL
Protein Creatinine Ratio: 1.37 mg/mg{Cre} — ABNORMAL HIGH (ref 0.00–0.15)
Total Protein, Urine: 147 mg/dL

## 2019-08-11 LAB — CBC
HCT: 37.2 % (ref 36.0–46.0)
Hemoglobin: 11.9 g/dL — ABNORMAL LOW (ref 12.0–15.0)
MCH: 25.9 pg — ABNORMAL LOW (ref 26.0–34.0)
MCHC: 32 g/dL (ref 30.0–36.0)
MCV: 81 fL (ref 80.0–100.0)
Platelets: 339 10*3/uL (ref 150–400)
RBC: 4.59 MIL/uL (ref 3.87–5.11)
RDW: 16.6 % — ABNORMAL HIGH (ref 11.5–15.5)
WBC: 7.9 10*3/uL (ref 4.0–10.5)
nRBC: 0 % (ref 0.0–0.2)

## 2019-08-11 LAB — SARS CORONAVIRUS 2 BY RT PCR (HOSPITAL ORDER, PERFORMED IN ~~LOC~~ HOSPITAL LAB): SARS Coronavirus 2: NEGATIVE

## 2019-08-11 LAB — GLUCOSE, CAPILLARY: Glucose-Capillary: 91 mg/dL (ref 70–99)

## 2019-08-11 MED ORDER — ONDANSETRON HCL 4 MG/2ML IJ SOLN: 4.0000 mg | Freq: Four times a day (QID) | INTRAMUSCULAR | Status: DC | PRN

## 2019-08-11 MED ORDER — LABETALOL HCL 5 MG/ML IV SOLN: 80.0000 mg | INTRAVENOUS | Status: DC | PRN

## 2019-08-11 MED ORDER — TERBUTALINE SULFATE 1 MG/ML IJ SOLN: 0.2500 mg | Freq: Once | INTRAMUSCULAR | Status: DC | PRN

## 2019-08-11 MED ORDER — LACTATED RINGERS IV SOLN
500.0000 mL | INTRAVENOUS | Status: DC | PRN
  Administered 2019-08-12: 22:00:00 500 mL via INTRAVENOUS

## 2019-08-11 MED ORDER — OXYTOCIN 40 UNITS IN NORMAL SALINE INFUSION - SIMPLE MED
2.5000 [IU]/h | INTRAVENOUS | Status: DC
  Administered 2019-08-13: 04:00:00 2.5 [IU]/h via INTRAVENOUS

## 2019-08-11 MED ORDER — OXYCODONE-ACETAMINOPHEN 5-325 MG PO TABS: 1.0000 | ORAL_TABLET | ORAL | Status: DC | PRN

## 2019-08-11 MED ORDER — LACTATED RINGERS IV SOLN
INTRAVENOUS | Status: DC
  Administered 2019-08-11 (×2): via INTRAVENOUS

## 2019-08-11 MED ORDER — MISOPROSTOL 25 MCG QUARTER TABLET
25.0000 ug | ORAL_TABLET | Freq: Three times a day (TID) | ORAL | Status: DC | PRN
  Administered 2019-08-11 – 2019-08-12 (×2): 25 ug via VAGINAL
  Filled 2019-08-11 (×3): qty 1

## 2019-08-11 MED ORDER — LABETALOL HCL 5 MG/ML IV SOLN
20.0000 mg | INTRAVENOUS | Status: DC | PRN
  Administered 2019-08-11 – 2019-08-13 (×4): 20 mg via INTRAVENOUS
  Filled 2019-08-11 (×4): qty 4

## 2019-08-11 MED ORDER — OXYTOCIN BOLUS FROM INFUSION
500.0000 mL | Freq: Once | INTRAVENOUS | Status: AC
  Administered 2019-08-13: 03:00:00 500 mL via INTRAVENOUS

## 2019-08-11 MED ORDER — ACETAMINOPHEN 325 MG PO TABS: 650.0000 mg | ORAL_TABLET | ORAL | Status: DC | PRN

## 2019-08-11 MED ORDER — CARBOPROST TROMETHAMINE 250 MCG/ML IM SOLN
INTRAMUSCULAR | Status: AC
  Filled 2019-08-11: qty 1

## 2019-08-11 MED ORDER — LABETALOL HCL 5 MG/ML IV SOLN
40.0000 mg | INTRAVENOUS | Status: DC | PRN
  Administered 2019-08-11: 17:00:00 40 mg via INTRAVENOUS
  Filled 2019-08-11: qty 8

## 2019-08-11 MED ORDER — SOD CITRATE-CITRIC ACID 500-334 MG/5ML PO SOLN: 30.0000 mL | ORAL | Status: DC | PRN

## 2019-08-11 MED ORDER — OXYTOCIN 40 UNITS IN NORMAL SALINE INFUSION - SIMPLE MED: 1.0000 m[IU]/min | INTRAVENOUS | Status: DC

## 2019-08-11 MED ORDER — BUTORPHANOL TARTRATE 1 MG/ML IJ SOLN
1.0000 mg | INTRAMUSCULAR | Status: DC | PRN
  Administered 2019-08-12 (×3): 1 mg via INTRAVENOUS
  Filled 2019-08-11 (×3): qty 1

## 2019-08-11 MED ORDER — MISOPROSTOL 50MCG HALF TABLET
50.0000 ug | ORAL_TABLET | Freq: Three times a day (TID) | ORAL | Status: DC | PRN
  Administered 2019-08-11 – 2019-08-12 (×2): 50 ug via ORAL
  Filled 2019-08-11 (×2): qty 1

## 2019-08-11 MED ORDER — LIDOCAINE HCL (PF) 1 % IJ SOLN: 30.0000 mL | INTRAMUSCULAR | Status: DC | PRN

## 2019-08-11 MED ORDER — HYDRALAZINE HCL 20 MG/ML IJ SOLN: 10.0000 mg | INTRAMUSCULAR | Status: DC | PRN

## 2019-08-11 MED ORDER — MAGNESIUM SULFATE 40 G IN LACTATED RINGERS - SIMPLE
2.0000 g/h | INTRAVENOUS | Status: AC
  Administered 2019-08-11 – 2019-08-13 (×3): 2 g/h via INTRAVENOUS
  Filled 2019-08-11 (×4): qty 500

## 2019-08-11 MED ORDER — MAGNESIUM SULFATE BOLUS VIA INFUSION
4.0000 g | Freq: Once | INTRAVENOUS | Status: AC
  Administered 2019-08-11: 4 g via INTRAVENOUS
  Filled 2019-08-11: qty 500

## 2019-08-11 MED ORDER — OXYCODONE-ACETAMINOPHEN 5-325 MG PO TABS: 2.0000 | ORAL_TABLET | ORAL | Status: DC | PRN

## 2019-08-11 NOTE — MAU Note (Addendum)
Feeling SOB today, like  She can't get a full breath, doesn't have room.  Said usually she will do her breathing exercised and it will calm down, but it is not working today.   Pt denies cough, sore throat, fever, no change in sense of taste or smell. BP was a little elevated yesterday in office, labs drawn, has not heard results, was to return tomorrow for recheck.  Denies HA, blurred vision or floaters today, no increased swelling, little pain in RUQ- started a couple days ago- sharp, not all the time.

## 2019-08-11 NOTE — MAU Note (Signed)
Provider at bedside third BP reading 154/107.  Provider ordered RN to hold labetolol until pt has one more severe range blood pressure.

## 2019-08-11 NOTE — MAU Note (Signed)
Provider made aware L. Leftwhich-Kirby, CNM of two severe range blood pressures.

## 2019-08-11 NOTE — H&P (Signed)
Robin Gallegos is a 28 y.o. female G1P0 at 38+ called earlier in day w SOB and lightheaded.  PreE work-up pending at office.  PreEclampsia w sever features in MAU.  BP treated.  Admitted for IOL, started on magnesium for seizure prophylaxis.  D/w pt IOL w misoprostol, remote from delivery.  Pregnancy complicated by GDM, controlled w diet.  Pregnancy w borderline CHTN - treated w daily baby ASA.    OB History    Gravida  1   Para  0   Term  0   Preterm  0   AB  0   Living  0     SAB  0   TAB  0   Ectopic  0   Multiple  0   Live Births  0         G1 present No abn pap No STD  History reviewed. No pertinent past medical history. History reviewed. No pertinent surgical history. Family History: family history is not on file. Social History:  reports that she has never smoked. She has never used smokeless tobacco. She reports previous alcohol use. She reports previous drug use.  Meds PNV All NKDA     Maternal Diabetes: Yes:  Diabetes Type:  Diet controlled Genetic Screening: Normal Maternal Ultrasounds/Referrals: Normal Fetal Ultrasounds or other Referrals:  None Maternal Substance Abuse:  No Significant Maternal Medications:  None Significant Maternal Lab Results:  Group B Strep negative Other Comments:  None  Review of Systems  Constitutional: Negative.   HENT: Negative.   Eyes: Negative.   Respiratory: Negative.   Cardiovascular: Negative.   Gastrointestinal: Negative.   Genitourinary: Negative.   Musculoskeletal: Positive for back pain.  Skin: Negative.   Neurological: Positive for dizziness and weakness.  Psychiatric/Behavioral: Negative.    Maternal Medical History:  Contractions: Frequency: irregular.    Fetal activity: Perceived fetal activity is normal.    Prenatal complications: Pre-eclampsia.   Prenatal Complications - Diabetes: gestational. Diabetes is managed by diet.      Dilation: 1 Effacement (%): Thick Station: -3 Exam by:: S  Earl RNC Blood pressure (!) 144/104, pulse (!) 103, temperature 98.9 F (37.2 C), temperature source Oral, resp. rate 18, height 5\' 4"  (1.626 m), weight 103.9 kg, SpO2 99 %. Maternal Exam:  Abdomen: Patient reports no abdominal tenderness. Fundal height is appropriate for gestation.   Estimated fetal weight is 7#.   Fetal presentation: vertex  Introitus: Normal vulva. Normal vagina.    Physical Exam  Constitutional: She is oriented to person, place, and time. She appears well-developed and well-nourished.  HENT:  Head: Normocephalic and atraumatic.  Cardiovascular: Normal rate and regular rhythm.  Respiratory: Effort normal and breath sounds normal. No respiratory distress.  GI: Soft. Bowel sounds are normal. There is no abdominal tenderness.  Genitourinary:    Vulva normal.   Musculoskeletal: Normal range of motion.  Neurological: She is alert and oriented to person, place, and time.  Skin: Skin is warm and dry.  Psychiatric: She has a normal mood and affect. Her behavior is normal.    Prenatal labs: ABO, Rh: A/Positive/-- (03/05 0000) Antibody: Negative (03/05 0000) Rubella: Immune (03/05 0000) RPR: Nonreactive (03/05 0000)  HBsAg: Negative (03/05 0000)  HIV: Non-reactive (03/05 0000)  GBS: Negative (08/13 0000)  Hgb 12.2/Plt 410K/Ur Cx neg/GC neg/Chl neg/Varicella immune/Hgb electro WNL/First Tri scr WNL/glucola 162, failed 3 hr/US nl anat female.  Dated by LMP/ Tdap 6/20  Assessment/Plan: 28yo G1P0 at 38+ with severe Pres for IOL  Mg for sz prophylaxis gbbs neg cytotec and AROM/pitocin for IOL Expect SVD   Charlena Haub Bovard-Stuckert 08/11/2019, 10:14 PM

## 2019-08-11 NOTE — MAU Provider Note (Signed)
Chief Complaint:  Shortness of Breath   First Provider Initiated Contact with Patient 08/11/19 1545      HPI: Robin Gallegos is a 28 y.o. G1P0 at [redacted]w[redacted]d sent from the office with shortness of breath and hypertension who presents to maternity admissions reporting shortness of breath with exertion and at rest x 2-3 days. She denies cough, fever/chills, sore throat, or change in taste or smell.  She denies sick contacts. She denies h/a, epigastric pain or visual changes.  There are no other symptoms. She has tried rest and breathing exercises but continues to be short of breath.   She reports good fetal movement.  HPI  Past Medical History: History reviewed. No pertinent past medical history.  Past obstetric history: OB History  Gravida Para Term Preterm AB Living  1            SAB TAB Ectopic Multiple Live Births               # Outcome Date GA Lbr Len/2nd Weight Sex Delivery Anes PTL Lv  1 Current             Past Surgical History: History reviewed. No pertinent surgical history.  Family History: History reviewed. No pertinent family history.  Social History: Social History   Tobacco Use  . Smoking status: Never Smoker  . Smokeless tobacco: Never Used  Substance Use Topics  . Alcohol use: Not Currently  . Drug use: Not Currently    Allergies: No Known Allergies  Meds:  Medications Prior to Admission  Medication Sig Dispense Refill Last Dose  . ibuprofen (ADVIL,MOTRIN) 600 MG tablet Take 1 tablet (600 mg total) by mouth every 6 (six) hours as needed. 30 tablet 0 Past Month at Unknown time  . cyclobenzaprine (FLEXERIL) 10 MG tablet Take 1 tablet (10 mg total) by mouth 2 (two) times daily as needed for muscle spasms. 20 tablet 0     ROS:  Review of Systems  Constitutional: Negative for chills, fatigue and fever.  Eyes: Negative for visual disturbance.  Respiratory: Positive for shortness of breath. Negative for cough.   Cardiovascular: Positive for leg swelling.  Negative for chest pain.  Gastrointestinal: Negative for abdominal pain, nausea and vomiting.  Genitourinary: Negative for difficulty urinating, dysuria, flank pain, pelvic pain, vaginal bleeding, vaginal discharge and vaginal pain.  Neurological: Negative for dizziness and headaches.  Psychiatric/Behavioral: Negative.      I have reviewed patient's Past Medical Hx, Surgical Hx, Family Hx, Social Hx, medications and allergies.   Physical Exam   Patient Vitals for the past 24 hrs:  BP Temp Temp src Pulse Resp SpO2 Height Weight  08/11/19 1715 (!) 138/92 - - 90 - 99 % - -  08/11/19 1700 (!) 140/97 - - 96 - 100 % - -  08/11/19 1649 (!) 151/103 - - 100 - 99 % - -  08/11/19 1630 (!) 154/113 - - 96 - 99 % - -  08/11/19 1615 (!) 155/106 - - 95 - 100 % - -  08/11/19 1600 (!) 155/103 - - 95 - 100 % - -  08/11/19 1545 (!) 162/110 - - (!) 105 - 100 % - -  08/11/19 1525 (!) 156/110 - - 99 - 99 % - -  08/11/19 1442 (!) 149/114 98.3 F (36.8 C) Oral (!) 108 (!) 22 100 % 5\' 4"  (1.626 m) 103.9 kg  08/11/19 1438 - - - - - 100 % - -   Constitutional: Well-developed, well-nourished female in  no acute distress.  Cardiovascular: normal rate Respiratory: normal effort GI: Abd soft, non-tender, gravid appropriate for gestational age.  MS: Extremities nontender, no edema, normal ROM Neurologic: Alert and oriented x 4.  GU: Neg CVAT.     FHT:  Baseline 140 , moderate variability, accelerations present, no decelerations Contractions: irregular, mild to palpation   Labs: Results for orders placed or performed during the hospital encounter of 08/11/19 (from the past 24 hour(s))  CBC     Status: Abnormal   Collection Time: 08/11/19  3:29 PM  Result Value Ref Range   WBC 7.9 4.0 - 10.5 K/uL   RBC 4.59 3.87 - 5.11 MIL/uL   Hemoglobin 11.9 (L) 12.0 - 15.0 g/dL   HCT 37.2 36.0 - 46.0 %   MCV 81.0 80.0 - 100.0 fL   MCH 25.9 (L) 26.0 - 34.0 pg   MCHC 32.0 30.0 - 36.0 g/dL   RDW 16.6 (H) 11.5 - 15.5 %    Platelets 339 150 - 400 K/uL   nRBC 0.0 0.0 - 0.2 %  Comprehensive metabolic panel     Status: Abnormal   Collection Time: 08/11/19  3:29 PM  Result Value Ref Range   Sodium 136 135 - 145 mmol/L   Potassium 4.2 3.5 - 5.1 mmol/L   Chloride 105 98 - 111 mmol/L   CO2 21 (L) 22 - 32 mmol/L   Glucose, Bld 86 70 - 99 mg/dL   BUN 8 6 - 20 mg/dL   Creatinine, Ser 0.70 0.44 - 1.00 mg/dL   Calcium 9.8 8.9 - 10.3 mg/dL   Total Protein 6.8 6.5 - 8.1 g/dL   Albumin 2.9 (L) 3.5 - 5.0 g/dL   AST 24 15 - 41 U/L   ALT 15 0 - 44 U/L   Alkaline Phosphatase 121 38 - 126 U/L   Total Bilirubin 0.6 0.3 - 1.2 mg/dL   GFR calc non Af Amer >60 >60 mL/min   GFR calc Af Amer >60 >60 mL/min   Anion gap 10 5 - 15  Protein / creatinine ratio, urine     Status: Abnormal   Collection Time: 08/11/19  3:29 PM  Result Value Ref Range   Creatinine, Urine 107.11 mg/dL   Total Protein, Urine 147 mg/dL   Protein Creatinine Ratio 1.37 (H) 0.00 - 0.15 mg/mg[Cre]  Urinalysis, Routine w reflex microscopic     Status: Abnormal   Collection Time: 08/11/19  4:06 PM  Result Value Ref Range   Color, Urine YELLOW YELLOW   APPearance HAZY (A) CLEAR   Specific Gravity, Urine 1.017 1.005 - 1.030   pH 7.0 5.0 - 8.0   Glucose, UA NEGATIVE NEGATIVE mg/dL   Hgb urine dipstick NEGATIVE NEGATIVE   Bilirubin Urine NEGATIVE NEGATIVE   Ketones, ur NEGATIVE NEGATIVE mg/dL   Protein, ur 100 (A) NEGATIVE mg/dL   Nitrite NEGATIVE NEGATIVE   Leukocytes,Ua NEGATIVE NEGATIVE   RBC / HPF 0-5 0 - 5 RBC/hpf   WBC, UA 0-5 0 - 5 WBC/hpf   Bacteria, UA RARE (A) NONE SEEN   Squamous Epithelial / LPF 6-10 0 - 5   Mucus PRESENT       Imaging:  No results found.  MAU Course/MDM: Orders Placed This Encounter  Procedures  . SARS Coronavirus 2 [facility] order, Performed in [facility] hospital lab) Nasopharyngeal Nasopharyngeal Swab  . Urinalysis, Routine w reflex microscopic  . CBC  . Comprehensive metabolic panel  . Protein /  creatinine ratio, urine  .  RPR  . Notify Physician  . Measure blood pressure  . Cervical Exam  . Insert and maintain IV Line    Meds ordered this encounter  Medications  . AND Linked Order Group   . labetalol (NORMODYNE) injection 20 mg   . labetalol (NORMODYNE) injection 40 mg   . labetalol (NORMODYNE) injection 80 mg   . hydrALAZINE (APRESOLINE) injection 10 mg  . lactated ringers infusion  . misoprostol (CYTOTEC) tablet 50 mcg     NST reviewed and reactive Severe range BP on arrival, preeclampsia protocol ordered and IV labetalol x 2 doses given.  PEC labs pending.  Called Dr Hinton RaoBovard-Stuckert and pt admitted for IOL for preeclampsia with severe features based on BP values. Pt held in MAU due to high volume on L&D currently. Pt stable at this time.   Assessment: 1. Pre-eclampsia in third trimester   GBS negative  Plan: Admit to L&D IOL for preeclampsia Dr Hinton RaoBovard-Stuckert to assume care of pt   Sharen CounterLisa Leftwich-Kirby Certified Nurse-Midwife 08/11/2019 5:16 PM

## 2019-08-12 ENCOUNTER — Inpatient Hospital Stay (HOSPITAL_COMMUNITY): Payer: No Typology Code available for payment source | Admitting: Anesthesiology

## 2019-08-12 ENCOUNTER — Encounter (HOSPITAL_COMMUNITY): Payer: Self-pay | Admitting: *Deleted

## 2019-08-12 LAB — GLUCOSE, CAPILLARY
Glucose-Capillary: 83 mg/dL (ref 70–99)
Glucose-Capillary: 94 mg/dL (ref 70–99)
Glucose-Capillary: 107 mg/dL — ABNORMAL HIGH (ref 70–99)
Glucose-Capillary: 114 mg/dL — ABNORMAL HIGH (ref 70–99)
Glucose-Capillary: 91 mg/dL (ref 70–99)

## 2019-08-12 LAB — CBC
HCT: 35.2 % — ABNORMAL LOW (ref 36.0–46.0)
HCT: 36.1 % (ref 36.0–46.0)
Hemoglobin: 11.3 g/dL — ABNORMAL LOW (ref 12.0–15.0)
Hemoglobin: 11.6 g/dL — ABNORMAL LOW (ref 12.0–15.0)
MCH: 25.7 pg — ABNORMAL LOW (ref 26.0–34.0)
MCH: 25.8 pg — ABNORMAL LOW (ref 26.0–34.0)
MCHC: 32.1 g/dL (ref 30.0–36.0)
MCHC: 32.1 g/dL (ref 30.0–36.0)
MCV: 80 fL (ref 80.0–100.0)
MCV: 80.4 fL (ref 80.0–100.0)
Platelets: 303 10*3/uL (ref 150–400)
Platelets: 313 10*3/uL (ref 150–400)
RBC: 4.4 MIL/uL (ref 3.87–5.11)
RBC: 4.49 MIL/uL (ref 3.87–5.11)
RDW: 16.6 % — ABNORMAL HIGH (ref 11.5–15.5)
RDW: 16.8 % — ABNORMAL HIGH (ref 11.5–15.5)
WBC: 9.6 10*3/uL (ref 4.0–10.5)
WBC: 8.9 10*3/uL (ref 4.0–10.5)
nRBC: 0 % (ref 0.0–0.2)
nRBC: 0 % (ref 0.0–0.2)

## 2019-08-12 MED ORDER — LACTATED RINGERS IV SOLN: 500.0000 mL | Freq: Once | INTRAVENOUS | Status: DC

## 2019-08-12 MED ORDER — LIDOCAINE HCL (PF) 1 % IJ SOLN
INTRAMUSCULAR | Status: DC | PRN
  Administered 2019-08-12 (×2): 5 mL via EPIDURAL

## 2019-08-12 MED ORDER — PHENYLEPHRINE 40 MCG/ML (10ML) SYRINGE FOR IV PUSH (FOR BLOOD PRESSURE SUPPORT)
80.0000 ug | PREFILLED_SYRINGE | INTRAVENOUS | Status: DC | PRN
  Filled 2019-08-12: qty 10

## 2019-08-12 MED ORDER — SODIUM CHLORIDE (PF) 0.9 % IJ SOLN
INTRAMUSCULAR | Status: DC | PRN
  Administered 2019-08-12: 12 mL/h via EPIDURAL

## 2019-08-12 MED ORDER — OXYTOCIN 40 UNITS IN NORMAL SALINE INFUSION - SIMPLE MED
1.0000 m[IU]/min | INTRAVENOUS | Status: DC
  Administered 2019-08-12 (×2): 2 m[IU]/min via INTRAVENOUS
  Filled 2019-08-12: qty 1000

## 2019-08-12 MED ORDER — EPHEDRINE 5 MG/ML INJ: 10.0000 mg | INTRAVENOUS | Status: DC | PRN

## 2019-08-12 MED ORDER — PHENYLEPHRINE 40 MCG/ML (10ML) SYRINGE FOR IV PUSH (FOR BLOOD PRESSURE SUPPORT): 80.0000 ug | PREFILLED_SYRINGE | INTRAVENOUS | Status: DC | PRN

## 2019-08-12 MED ORDER — FENTANYL-BUPIVACAINE-NACL 0.5-0.125-0.9 MG/250ML-% EP SOLN
12.0000 mL/h | EPIDURAL | Status: DC | PRN
  Filled 2019-08-12: qty 250

## 2019-08-12 MED ORDER — DIPHENHYDRAMINE HCL 50 MG/ML IJ SOLN: 12.5000 mg | INTRAMUSCULAR | Status: DC | PRN

## 2019-08-12 MED ORDER — FENTANYL-BUPIVACAINE-NACL 0.5-0.125-0.9 MG/250ML-% EP SOLN: 12.0000 mL/h | EPIDURAL | Status: DC | PRN

## 2019-08-12 NOTE — Anesthesia Procedure Notes (Signed)
Epidural Patient location during procedure: OB  Staffing Anesthesiologist: Montez Hageman, MD Performed: anesthesiologist   Preanesthetic Checklist Completed: patient identified, site marked, surgical consent, pre-op evaluation, timeout performed, IV checked, risks and benefits discussed and monitors and equipment checked  Epidural Patient position: sitting Prep: DuraPrep Patient monitoring: heart rate, continuous pulse ox and blood pressure Approach: midline Location: L4-L5 Injection technique: LOR saline  Needle:  Needle type: Tuohy  Needle gauge: 17 G Needle length: 9 cm and 9 Needle insertion depth: 8 cm Catheter type: closed end flexible Catheter size: 20 Guage Catheter at skin depth: 12 cm Test dose: negative  Assessment Events: blood not aspirated, injection not painful, no injection resistance, negative IV test and no paresthesia  Additional Notes Patient identified. Risks/Benefits/Options discussed with patient including but not limited to bleeding, infection, nerve damage, paralysis, failed block, incomplete pain control, headache, blood pressure changes, nausea, vomiting, reactions to medication both or allergic, itching and postpartum back pain. Confirmed with bedside nurse the patient's most recent platelet count. Confirmed with patient that they are not currently taking any anticoagulation, have any bleeding history or any family history of bleeding disorders. Patient expressed understanding and wished to proceed. All questions were answered. Sterile technique was used throughout the entire procedure. Please see nursing notes for vital signs. Test dose was given through epidural needle and negative prior to continuing to dose epidural or start infusion. Warning signs of high block given to the patient including shortness of breath, tingling/numbness in hands, complete motor block, or any concerning symptoms with instructions to call for help. Patient was given  instructions on fall risk and not to get out of bed. All questions and concerns addressed with instructions to call with any issues.

## 2019-08-12 NOTE — Anesthesia Preprocedure Evaluation (Signed)
Anesthesia Evaluation  Patient identified by MRN, date of birth, ID band Patient awake    Reviewed: Allergy & Precautions, H&P , NPO status , Patient's Chart, lab work & pertinent test results  History of Anesthesia Complications Negative for: history of anesthetic complications  Airway Mallampati: II  TM Distance: >3 FB Neck ROM: full    Dental no notable dental hx. (+) Teeth Intact   Pulmonary neg pulmonary ROS,    Pulmonary exam normal breath sounds clear to auscultation       Cardiovascular hypertension, negative cardio ROS Normal cardiovascular exam Rhythm:regular Rate:Normal     Neuro/Psych negative neurological ROS  negative psych ROS   GI/Hepatic negative GI ROS, Neg liver ROS,   Endo/Other  diabetes, GestationalMorbid obesity  Renal/GU negative Renal ROS  negative genitourinary   Musculoskeletal   Abdominal   Peds  Hematology negative hematology ROS (+)   Anesthesia Other Findings   Reproductive/Obstetrics (+) Pregnancy Pre-E                             Anesthesia Physical Anesthesia Plan  ASA: III  Anesthesia Plan: Epidural   Post-op Pain Management:    Induction:   PONV Risk Score and Plan:   Airway Management Planned:   Additional Equipment:   Intra-op Plan:   Post-operative Plan:   Informed Consent: I have reviewed the patients History and Physical, chart, labs and discussed the procedure including the risks, benefits and alternatives for the proposed anesthesia with the patient or authorized representative who has indicated his/her understanding and acceptance.       Plan Discussed with:   Anesthesia Plan Comments:         Anesthesia Quick Evaluation

## 2019-08-12 NOTE — Progress Notes (Signed)
Feeling some ctx, received Stadol Afeb, VSS, BP 110-150/70-100 FHT-130s, Cat I, irreg ctx on 18 mu/min pitocin VE-1-2/50/-1 to -2, vtx, AROM clear Will continue magnesium and pitocin, monitor progress and BP

## 2019-08-12 NOTE — Progress Notes (Signed)
Feeling some ctx, last cytotec placed about 45 minutes ago Afeb, VSS, BP 130-150/70-100 FHT-130s, Cat I, irreg ctx VE 1/50/-2 per RN Will start pitocin 4 hrs after this last cytotec, continue magnesium for seizure prophylaxis, IV labetalol prn elevated BP, monitor progress

## 2019-08-12 NOTE — Progress Notes (Signed)
Feeling some ctx Afeb, VSS, BP 130-170/80-100 FHT- Cat I, 130s, irreg ctx VE unchanged per RN Now on pitocin, has required IV labetalol x 2, continue magnesium, monitor progress

## 2019-08-13 ENCOUNTER — Encounter (HOSPITAL_COMMUNITY): Payer: Self-pay | Admitting: Emergency Medicine

## 2019-08-13 LAB — GLUCOSE, CAPILLARY: Glucose-Capillary: 104 mg/dL — ABNORMAL HIGH (ref 70–99)

## 2019-08-13 LAB — CBC
HCT: 37.1 % (ref 36.0–46.0)
Hemoglobin: 12 g/dL (ref 12.0–15.0)
MCH: 26.1 pg (ref 26.0–34.0)
MCHC: 32.3 g/dL (ref 30.0–36.0)
MCV: 80.8 fL (ref 80.0–100.0)
Platelets: 304 10*3/uL (ref 150–400)
RBC: 4.59 MIL/uL (ref 3.87–5.11)
RDW: 17 % — ABNORMAL HIGH (ref 11.5–15.5)
WBC: 14.4 10*3/uL — ABNORMAL HIGH (ref 4.0–10.5)
nRBC: 0 % (ref 0.0–0.2)

## 2019-08-13 LAB — RPR: RPR Ser Ql: NONREACTIVE

## 2019-08-13 MED ORDER — OXYCODONE HCL 5 MG PO TABS
5.0000 mg | ORAL_TABLET | ORAL | Status: DC | PRN
  Administered 2019-08-16: 5 mg via ORAL
  Filled 2019-08-13: qty 1

## 2019-08-13 MED ORDER — OXYCODONE HCL 5 MG PO TABS: 10.0000 mg | ORAL_TABLET | ORAL | Status: DC | PRN

## 2019-08-13 MED ORDER — METHYLERGONOVINE MALEATE 0.2 MG PO TABS: 0.2000 mg | ORAL_TABLET | ORAL | Status: DC | PRN

## 2019-08-13 MED ORDER — DIBUCAINE (PERIANAL) 1 % EX OINT: 1.0000 "application " | TOPICAL_OINTMENT | CUTANEOUS | Status: DC | PRN

## 2019-08-13 MED ORDER — ONDANSETRON HCL 4 MG PO TABS: 4.0000 mg | ORAL_TABLET | ORAL | Status: DC | PRN

## 2019-08-13 MED ORDER — IBUPROFEN 600 MG PO TABS
600.0000 mg | ORAL_TABLET | Freq: Four times a day (QID) | ORAL | Status: DC
  Administered 2019-08-13 – 2019-08-16 (×14): 600 mg via ORAL
  Filled 2019-08-13 (×15): qty 1

## 2019-08-13 MED ORDER — TETANUS-DIPHTH-ACELL PERTUSSIS 5-2.5-18.5 LF-MCG/0.5 IM SUSP: 0.5000 mL | Freq: Once | INTRAMUSCULAR | Status: DC

## 2019-08-13 MED ORDER — BENZOCAINE-MENTHOL 20-0.5 % EX AERO
1.0000 "application " | INHALATION_SPRAY | CUTANEOUS | Status: DC | PRN
  Administered 2019-08-13: 1 via TOPICAL
  Filled 2019-08-13: qty 56

## 2019-08-13 MED ORDER — MAGNESIUM HYDROXIDE 400 MG/5ML PO SUSP: 30.0000 mL | ORAL | Status: DC | PRN

## 2019-08-13 MED ORDER — MEASLES, MUMPS & RUBELLA VAC IJ SOLR: 0.5000 mL | Freq: Once | INTRAMUSCULAR | Status: DC

## 2019-08-13 MED ORDER — ONDANSETRON HCL 4 MG/2ML IJ SOLN: 4.0000 mg | INTRAMUSCULAR | Status: DC | PRN

## 2019-08-13 MED ORDER — PRENATAL MULTIVITAMIN CH
1.0000 | ORAL_TABLET | Freq: Every day | ORAL | Status: DC
  Administered 2019-08-13 – 2019-08-16 (×4): 1 via ORAL
  Filled 2019-08-13 (×4): qty 1

## 2019-08-13 MED ORDER — SENNOSIDES-DOCUSATE SODIUM 8.6-50 MG PO TABS
2.0000 | ORAL_TABLET | ORAL | Status: DC
  Administered 2019-08-13 – 2019-08-15 (×3): 2 via ORAL
  Filled 2019-08-13 (×3): qty 2

## 2019-08-13 MED ORDER — ZOLPIDEM TARTRATE 5 MG PO TABS: 5.0000 mg | ORAL_TABLET | Freq: Every evening | ORAL | Status: DC | PRN

## 2019-08-13 MED ORDER — METHYLERGONOVINE MALEATE 0.2 MG/ML IJ SOLN: 0.2000 mg | INTRAMUSCULAR | Status: DC | PRN

## 2019-08-13 MED ORDER — ACETAMINOPHEN 325 MG PO TABS
650.0000 mg | ORAL_TABLET | ORAL | Status: DC | PRN
  Administered 2019-08-14 (×2): 650 mg via ORAL
  Filled 2019-08-13 (×2): qty 2

## 2019-08-13 MED ORDER — LACTATED RINGERS IV SOLN
INTRAVENOUS | Status: DC
  Administered 2019-08-13 (×2): via INTRAVENOUS

## 2019-08-13 MED ORDER — WITCH HAZEL-GLYCERIN EX PADS: 1.0000 "application " | MEDICATED_PAD | CUTANEOUS | Status: DC | PRN

## 2019-08-13 MED ORDER — COCONUT OIL OIL
1.0000 "application " | TOPICAL_OIL | Status: DC | PRN
  Administered 2019-08-13: 1 via TOPICAL

## 2019-08-13 MED ORDER — DIPHENHYDRAMINE HCL 25 MG PO CAPS: 25.0000 mg | ORAL_CAPSULE | Freq: Four times a day (QID) | ORAL | Status: DC | PRN

## 2019-08-13 MED ORDER — SIMETHICONE 80 MG PO CHEW: 80.0000 mg | CHEWABLE_TABLET | ORAL | Status: DC | PRN

## 2019-08-13 NOTE — Lactation Note (Signed)
This note was copied from a baby's chart. 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  LC gave mom harmony hand pump for prn and pre-pump breast prior to latching infant due to flat nipples. LC entered room infant asleep in basinet, per mom, infant breastfeed for less than 2 hours when [facility] entered the room. Mom's current feeding choice is breast and formula feeding. Infant had 2 voids since delivery. Per mom, she feels breastfeeding is going well, infant breastfeed for 15 minutes most feedings. Infant has breastfeed 4 times since delivery. Mom taught back hand expression and mom will save 1 ml of colostrum expressed to offer infant after breastfeeding her at next feeding. LC observed mom has large flat nipples, mom was given breast shells to wear in bra to help extend nipple shaft out more and mom will pre-pump with hand pump prior to latching infant. Mom has been breastfeeding first , then supplementing infant with formula based on infant's age/ hours of life  and then using DEBP afterwards. Nurse explained to mom how to use DEBP, Mom knows to use DEBP every 3 hours for 15 minutes on initial setting. Mom knows to breastfed according hunger cues, 8 to 12 times within 24 hours and on demand. Mom knows to call Nurse or [place] if she has any questions, concerns or need assistance with latching infant to breast. Reviewed Baby & Me book's Breastfeeding Basics.  Mom made aware of O/P services, breastfeeding support groups, community resources, and our phone # for post-discharge questions.      Maternal Data Formula Feeding for Exclusion: No Has patient been taught Hand  Expression?: Yes(Mom taught back and expressed colostrum which she will offer at next feeding) Does the patient have breastfeeding experience prior to this delivery?: No  Feeding Feeding Type: Bottle Fed - Formula Nipple Type: Slow - flow  LATCH Score                   Interventions Interventions: Breast feeding basics reviewed;Hand express;Pre-pump if needed;Shells;DEBP;Hand pump  Lactation Tools Discussed/Used WIC Program: Yes Pump Review: Setup, frequency, and cleaning;Milk Storage Initiated by:: by Nurse Date initiated:: 08/13/19   Consult Status Consult Status: Follow-up Date: 08/13/19 Follow-up type: In-patient    Robin Gallegos 08/13/2019, 10:26 PM

## 2019-08-13 NOTE — Anesthesia Postprocedure Evaluation (Signed)
Anesthesia Post Note  Patient: Robin Gallegos  Procedure(s) Performed: AN AD [place]     Patient location during evaluation: Mother Baby Anesthesia Type: Epidural Level of consciousness: awake and alert Pain management: pain level controlled Vital Signs Assessment: post-procedure vital signs reviewed and stable Respiratory status: spontaneous breathing, nonlabored ventilation and respiratory function stable Cardiovascular status: stable Postop Assessment: no headache, no backache and epidural receding Anesthetic complications: no    Last Vitals:  Vitals:   08/13/19 1135 08/13/19 1440  BP: (!) 133/92 (!) 163/99  Pulse: (!) 101 (!) 104  Resp: 18   Temp: 37.2 C   SpO2: 96%     Last Pain:  Vitals:   08/13/19 1135  TempSrc: Oral  PainSc:    Pain Goal: Patients Stated Pain Goal: 8 (08/12/19 0300)                 Drucie Opitz

## 2019-08-14 MED ORDER — NIFEDIPINE ER OSMOTIC RELEASE 30 MG PO TB24
30.0000 mg | ORAL_TABLET | Freq: Every day | ORAL | Status: DC
  Administered 2019-08-14: 09:00:00 30 mg via ORAL
  Filled 2019-08-14: qty 1

## 2019-08-14 NOTE — Lactation Note (Signed)
This note was copied from a baby's chart. Lactation Consultation Note  Patient Name: Robin Gallegos [id number] Date: 08/14/2019    [id number] - [id number] - I visited [name] to check on her progress with breast feeding. She states that she is working on Regulatory affairs officer. At this time she declined assistance. Baby just had formula prior to my visit. She expressed interest in lactation follow up. I encouraged her to page via her RN tonight for assistance when baby is ready for a feeding.   Mom has a DEBP set up in the room. I encouraged her to pump q3 hours and to feed back any expressed breast milk to baby. LC follow up on 8/31 recommended.   Lenore Manner 08/14/2019, 11:37 PM

## 2019-08-14 NOTE — Progress Notes (Signed)
PPD #1 No problems, feels fine, magnesium off since 0300 Afeb, VSS, BP 120-150/70-100 Fundus firm, NT at U-1 Continue routine postpartum care, ambulate.  Since BP is up more this morning will start on Procardia XL 30 mg

## 2019-08-14 NOTE — Plan of Care (Signed)
Pt. Doing well this AM. Will ensure periods of rest as requested. Pt. Started on BP meds - procardia for boarderline pressures. Pt. Educated on procardia indications, and Side Effects. Will continue to monitor Bps. Frequently feeds baby.

## 2019-08-15 MED ORDER — NIFEDIPINE ER OSMOTIC RELEASE 30 MG PO TB24
60.0000 mg | ORAL_TABLET | Freq: Every day | ORAL | Status: DC
  Administered 2019-08-15: 09:00:00 60 mg via ORAL
  Filled 2019-08-15: qty 2

## 2019-08-15 MED ORDER — NIFEDIPINE ER OSMOTIC RELEASE 30 MG PO TB24
60.0000 mg | ORAL_TABLET | Freq: Two times a day (BID) | ORAL | Status: DC
  Administered 2019-08-15 – 2019-08-16 (×2): 60 mg via ORAL
  Filled 2019-08-15 (×2): qty 2

## 2019-08-15 NOTE — Lactation Note (Signed)
This note was copied from a baby's chart. Lactation Consultation Note  Patient Name: Robin Gallegos [id number] Date: 08/15/2019 Reason for consult: Follow-up assessment;Primapara;1st time breastfeeding;Infant weight loss;Other (Comment)(mom post [id number]) Type of Endocrine Disorder?: Diabetes  Baby is 78 hours old  Baby had just recently fed  5 mins at the breast and 75 ml of formula and sound asleep.  Mom has been breast formula as preference , but baby has been receiving mostly bottles.  LC reviewed supply and demand and the importance of giving baby practice at the breast. LC stressed the baby will more likely to latch if calm and may need and appetizer 1st from the bottle due to her getting use to it.  Mom mentioned the baby does latch and its comfortable.  Sore nipple and engorgement prevention and tx reviewed.  Mom has a hand pump and DEBP kit that per mom has only pumped x 2 in the last 24 hours and drops.  LC encouraged to call [place] this am and LC offered to sent a referral and mom receptive and is aware [place] will cal her.  Storage of breast milk reviewed.  LC offered to request and an [place] O/P appt and mom receptive for next Monday or Tuesday.  Mom aware of the [facility] resources.    Maternal Data Has patient been taught Hand Expression?: Yes Does the patient have breastfeeding experience prior to this delivery?: No  Feeding Feeding Type: (baby recently fed ,) Nipple Type: Slow - flow  LATCH Score                   Interventions Interventions: Breast feeding basics reviewed  Lactation Tools Discussed/Used Tools: Pump Breast pump type: Double-Electric Breast Pump;Manual WIC Program: Yes(per mom) Pump Review: Milk Storage   Consult Status Consult Status: Follow-up Date: (mom requested next Monday or Tuesday) Follow-up type: [place] 08/15/2019, 9:29 AM

## 2019-08-15 NOTE — Progress Notes (Signed)
UPDATE: patient has been increased to Nifedipine 60XL this AM (correction to earlier note). Patient remains asymptomatic, however Bps this shift 150s/100s. Reviewed need for improved control prior to discharge. Plan to change meds to Nif 60XL BID. Reviewed PreE precautions with patient, notify staff with any changes.

## 2019-08-15 NOTE — Progress Notes (Signed)
POSTPARTUM PROGRESS NOTE  Post Partum Day #2  Subjective:  No acute events overnight.  Pt denies problems with ambulating, voiding or po intake.  She denies nausea or vomiting.  Pain is well controlled.  She has had flatus. She has had bowel movement.  Lochia Moderate. Patient denies Ha, RUQ pain, LE edema, SOB, CP, visual changes  Objective: Blood pressure 139/88, pulse (!) 103, temperature 99 F (37.2 C), temperature source Oral, resp. rate 18, height 5\' 4"  (1.626 m), weight 103.9 kg, SpO2 100 %, unknown if currently breastfeeding.  Physical Exam:  General: alert, cooperative and no distress Lochia:normal flow Chest: CTAB Heart: RRR no m/r/g Abdomen: +BS, soft, nontender Uterine Fundus: firm, 1cm below umbilicus Extremities: neg edema, neg calf TTP BL, neg Homans BL  Recent Labs    08/12/19 1754 08/13/19 0402  HGB 11.6* 12.0  HCT 36.1 37.1    Assessment/Plan:  ASSESSMENT: Robin Gallegos is a 28 y.o. G1P1001 s/p SVD  @ [redacted]w[redacted]d. PNC c/b GDMA1, PreE w/ SF intrapartum s/p MgSO4. Doing well on Nifedipine 30XL.   PreE w/ SF: Asx this AM, s/p MgSO4 24hr pp. BP since initiation of Nif on 8/31 nonsevere, 130-150/80-90    *Monitor until early afternoon. If SBs 130-140 on meds, plan for DC home with BP check in office in 1 wk. Pt aware of preE Precautions  Breastfeeding and Contraception unsure. Office circ   LOS: 4 days

## 2019-08-16 ENCOUNTER — Inpatient Hospital Stay (HOSPITAL_COMMUNITY): Payer: No Typology Code available for payment source

## 2019-08-16 MED ORDER — IBUPROFEN 600 MG PO TABS: 600.0000 mg | ORAL_TABLET | Freq: Four times a day (QID) | ORAL | 1 refills | Status: DC | PRN

## 2019-08-16 MED ORDER — NIFEDIPINE ER 30 MG PO TB24: 60.0000 mg | ORAL_TABLET | Freq: Two times a day (BID) | ORAL | 1 refills | Status: DC

## 2019-08-16 NOTE — Progress Notes (Signed)
Patient ID: Robin Gallegos, female   DOB: Mar 21, 1991, 28 y.o.   MRN: [id number] Pt reports palpitations - feels " like i'm anxious" since arriving at hospital. She denies HA/CP or SOB. She is ambulating and tolerating PO. She reports lochia mild. Bonding well with baby VS: - 132-140/87-89, 119 GEN - fatigued but NAD ABD - FF and 3cm below umbilicus EXT - no homans  A/P: PPD#3 s/p svd - stable with tachycardia and severe range BP pp         S/P MgSO4         BP better controlled         Will obtain a PA/lateral 2D view CXR - if stable will d/c to home

## 2019-08-16 NOTE — Progress Notes (Signed)
Patient ID: Robin Gallegos, female   DOB: [birth year], 28 y.o.   MRN: [id number] CXR shows nl heart and lungs BP still stable.  Discussed with pt.  Will discharge to home now - precautions reviewed BP check in office on 08/20/2019 - to schedule

## 2019-08-16 NOTE — Discharge Summary (Signed)
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 yo G1P1001 at [redacted]w[redacted]d was admitted to the hospital 08/11/2019 for induction of labor.  Indication for induction: Preeclampsia.  Patient had an uncomplicated labor course as follows: Membrane Rupture Time/Date: 5:25 PM ,08/12/2019   Intrapartum Procedures: Episiotomy: None [1]                                         Lacerations:  None [1];1st degree [2]  Patient had delivery of a Viable infant.  Information for the patient's newborn:  Robin Gallegos, Robin Gallegos [[id number]]  Delivery Method: Vag-Spont    08/13/2019  Details of delivery can be found in separate delivery note.  Patient had a routine postpartum course. Patient is discharged home 08/16/19.  Physical exam  Vitals:   08/16/19 0409 08/16/19 0828 08/16/19 1205 08/16/19 1330  BP: 140/89 132/87 (!) 142/98 128/86  Pulse: (!) 119 (!) 113 (!) 111 (!) 108  Resp: 18  19   Temp: 98.5 F (36.9 C) 98 F (36.7 C) 98.5 F (36.9 C)   TempSrc: Oral Oral Oral   SpO2: 100% 99% 100% 98%  Weight:      Height:       General: alert, cooperative and no distress Lochia: appropriate Uterine Fundus: firm Incision: N/A DVT Evaluation: No evidence of DVT seen on physical exam. Labs: Lab Results  Component Value Date   WBC 14.4 (H)  08/13/2019   HGB 12.0 08/13/2019   HCT 37.1 08/13/2019   MCV 80.8 08/13/2019   PLT 304 08/13/2019   CMP Latest Ref Rng & Units 08/11/2019  Glucose 70 - 99 mg/dL 86  BUN 6 - 20 mg/dL 8  Creatinine 0.44 - 1.00 mg/dL 0.70  Sodium 135 - 145 mmol/L 136  Potassium 3.5 - 5.1 mmol/L 4.2  Chloride 98 - 111 mmol/L 105  CO2 22 - 32 mmol/L 21(L)  Calcium 8.9 - 10.3 mg/dL 9.8  Total Protein 6.5 - 8.1 g/dL 6.8  Total Bilirubin 0.3 - 1.2 mg/dL 0.6  Alkaline Phos 38 - 126 U/L 121  AST 15 - 41 U/L 24  ALT 0 - 44 U/L 15    Discharge instruction: per After Visit Summary and "Baby and Me Booklet".  After visit meds:  Allergies as of 08/16/2019  Reactions   Penicillins Hives   Did it involve swelling of the face/tongue/throat, SOB, or low BP? Unknown Did it involve sudden or severe rash/hives, skin peeling, or any reaction on the inside of your mouth or nose? Unknown Did you need to seek medical attention at a hospital or doctor's office? Unknown When did it last happen?pt was a child If all above answers are "NO", may proceed with cephalosporin use.      Medication List    TAKE these medications   ibuprofen 600 MG tablet Commonly known as: ADVIL Take 1 tablet (600 mg total) by mouth every 6 (six) hours as needed for cramping.   NIFEdipine 30 MG 24 hr tablet Commonly known as: ADALAT CC Take 2 tablets (60 mg total) by mouth 2 (two) times daily.       Diet: low salt diet  Activity: Advance as tolerated. Pelvic rest for 6 weeks.   Outpatient follow TU:[id number] BP check on 9/5, postpartum visit in 6 weeks Follow up Appt:No future appointments. Follow up Visit:No follow-ups on file.  Postpartum contraception: Not Discussed  Newborn Data: Live born female  Birth Weight: 6 lb 7.2 oz (2926 g) APGAR: 7, 9  Newborn Delivery   Birth date/time: 08/13/2019 03:08:00 Delivery type: Vaginal, Spontaneous      Baby Feeding: Breast Disposition:home with  mother   08/16/2019 Robin Muster, DO

## 2019-08-16 NOTE — Discharge Instructions (Signed)
Call office with any concerns [phone] 

## 2019-09-26 DIAGNOSIS — Z124 Encounter for screening for malignant neoplasm of cervix: Secondary | ICD-10-CM | POA: Diagnosis not present

## 2019-09-26 DIAGNOSIS — Z1151 Encounter for screening for human papillomavirus (HPV): Secondary | ICD-10-CM | POA: Diagnosis not present

## 2019-10-04 ENCOUNTER — Encounter (HOSPITAL_COMMUNITY): Payer: Self-pay

## 2019-10-04 ENCOUNTER — Inpatient Hospital Stay (HOSPITAL_COMMUNITY)
Admission: EM | Admit: 2019-10-04 | Discharge: 2019-10-07 | DRG: 291 | Disposition: A | Discharge status: Home / Self Care | Payer: No Typology Code available for payment source | Attending: Internal Medicine | Admitting: Internal Medicine

## 2019-10-04 ENCOUNTER — Emergency Department (HOSPITAL_COMMUNITY): Payer: No Typology Code available for payment source

## 2019-10-04 ENCOUNTER — Other Ambulatory Visit: Payer: Self-pay

## 2019-10-04 DIAGNOSIS — I509 Heart failure, unspecified: Secondary | ICD-10-CM

## 2019-10-04 DIAGNOSIS — I340 Nonrheumatic mitral (valve) insufficiency: Secondary | ICD-10-CM | POA: Diagnosis not present

## 2019-10-04 DIAGNOSIS — I081 Rheumatic disorders of both mitral and tricuspid valves: Secondary | ICD-10-CM | POA: Diagnosis present

## 2019-10-04 DIAGNOSIS — E669 Obesity, unspecified: Secondary | ICD-10-CM | POA: Diagnosis present

## 2019-10-04 DIAGNOSIS — Z8249 Family history of ischemic heart disease and other diseases of the circulatory system: Secondary | ICD-10-CM

## 2019-10-04 DIAGNOSIS — I110 Hypertensive heart disease with heart failure: Secondary | ICD-10-CM | POA: Diagnosis not present

## 2019-10-04 DIAGNOSIS — Z8632 Personal history of gestational diabetes: Secondary | ICD-10-CM | POA: Diagnosis not present

## 2019-10-04 DIAGNOSIS — I4581 Long QT syndrome: Secondary | ICD-10-CM | POA: Diagnosis present

## 2019-10-04 DIAGNOSIS — I10 Essential (primary) hypertension: Secondary | ICD-10-CM

## 2019-10-04 DIAGNOSIS — I5021 Acute systolic (congestive) heart failure: Secondary | ICD-10-CM | POA: Diagnosis not present

## 2019-10-04 DIAGNOSIS — R0602 Shortness of breath: Secondary | ICD-10-CM

## 2019-10-04 DIAGNOSIS — E010 Iodine-deficiency related diffuse (endemic) goiter: Secondary | ICD-10-CM | POA: Diagnosis present

## 2019-10-04 DIAGNOSIS — Z6832 Body mass index (BMI) 32.0-32.9, adult: Secondary | ICD-10-CM | POA: Diagnosis not present

## 2019-10-04 DIAGNOSIS — F410 Panic disorder [episodic paroxysmal anxiety] without agoraphobia: Secondary | ICD-10-CM | POA: Diagnosis present

## 2019-10-04 DIAGNOSIS — R7989 Other specified abnormal findings of blood chemistry: Secondary | ICD-10-CM | POA: Diagnosis present

## 2019-10-04 DIAGNOSIS — R000 Tachycardia, unspecified: Secondary | ICD-10-CM | POA: Diagnosis present

## 2019-10-04 DIAGNOSIS — O903 Peripartum cardiomyopathy: Secondary | ICD-10-CM | POA: Diagnosis present

## 2019-10-04 DIAGNOSIS — F419 Anxiety disorder, unspecified: Secondary | ICD-10-CM

## 2019-10-04 DIAGNOSIS — Z20828 Contact with and (suspected) exposure to other viral communicable diseases: Secondary | ICD-10-CM | POA: Diagnosis present

## 2019-10-04 DIAGNOSIS — Z880 Allergy status to penicillin: Secondary | ICD-10-CM

## 2019-10-04 DIAGNOSIS — I429 Cardiomyopathy, unspecified: Secondary | ICD-10-CM | POA: Diagnosis not present

## 2019-10-04 LAB — COMPREHENSIVE METABOLIC PANEL
ALT: 13 U/L (ref 0–44)
AST: 16 U/L (ref 15–41)
Albumin: 3.7 g/dL (ref 3.5–5.0)
Alkaline Phosphatase: 74 U/L (ref 38–126)
Anion gap: 11 (ref 5–15)
BUN: 10 mg/dL (ref 6–20)
CO2: 22 mmol/L (ref 22–32)
Calcium: 9.3 mg/dL (ref 8.9–10.3)
Chloride: 107 mmol/L (ref 98–111)
Creatinine, Ser: 0.93 mg/dL (ref 0.44–1.00)
GFR calc Af Amer: >60 mL/min (ref >60)
GFR calc non Af Amer: >60 mL/min (ref >60)
Glucose, Bld: 112 mg/dL — ABNORMAL HIGH (ref 70–99)
Potassium: 4.1 mmol/L (ref 3.5–5.1)
Sodium: 140 mmol/L (ref 135–145)
Total Bilirubin: 0.7 mg/dL (ref 0.3–1.2)
Total Protein: 6.9 g/dL (ref 6.5–8.1)

## 2019-10-04 LAB — CBC
HCT: 38.4 % (ref 36.0–46.0)
HCT: 40.1 % (ref 36.0–46.0)
Hemoglobin: 12.4 g/dL (ref 12.0–15.0)
Hemoglobin: 13.1 g/dL (ref 12.0–15.0)
MCH: 26.3 pg (ref 26.0–34.0)
MCH: 26 pg (ref 26.0–34.0)
MCHC: 32.3 g/dL (ref 30.0–36.0)
MCHC: 32.7 g/dL (ref 30.0–36.0)
MCV: 81.4 fL (ref 80.0–100.0)
MCV: 79.7 fL — ABNORMAL LOW (ref 80.0–100.0)
Platelets: 422 10*3/uL — ABNORMAL HIGH (ref 150–400)
Platelets: 405 10*3/uL — ABNORMAL HIGH (ref 150–400)
RBC: 4.72 MIL/uL (ref 3.87–5.11)
RBC: 5.03 MIL/uL (ref 3.87–5.11)
RDW: 15.7 % — ABNORMAL HIGH (ref 11.5–15.5)
RDW: 16 % — ABNORMAL HIGH (ref 11.5–15.5)
WBC: 11.7 10*3/uL — ABNORMAL HIGH (ref 4.0–10.5)
WBC: 8.5 10*3/uL (ref 4.0–10.5)
nRBC: 0 % (ref 0.0–0.2)
nRBC: 0 % (ref 0.0–0.2)

## 2019-10-04 LAB — CREATININE, SERUM
Creatinine, Ser: 1.27 mg/dL — ABNORMAL HIGH (ref 0.44–1.00)
GFR calc Af Amer: >60 mL/min (ref >60)
GFR calc non Af Amer: 57 mL/min — ABNORMAL LOW (ref >60)

## 2019-10-04 LAB — TSH: TSH: 1.379 u[IU]/mL (ref 0.350–4.500)

## 2019-10-04 LAB — BRAIN NATRIURETIC PEPTIDE: B Natriuretic Peptide: 823.6 pg/mL — ABNORMAL HIGH (ref 0.0–100.0)

## 2019-10-04 LAB — TROPONIN I (HIGH SENSITIVITY)
Troponin I (High Sensitivity): 18 ng/L — ABNORMAL HIGH (ref <18)
Troponin I (High Sensitivity): 21 ng/L — ABNORMAL HIGH (ref <18)

## 2019-10-04 LAB — HIV ANTIBODY (ROUTINE TESTING W REFLEX): HIV Screen 4th Generation wRfx: NONREACTIVE

## 2019-10-04 MED ORDER — HEPARIN SODIUM (PORCINE) 5000 UNIT/ML IJ SOLN
5000.0000 [IU] | Freq: Three times a day (TID) | INTRAMUSCULAR | Status: DC
  Administered 2019-10-04 – 2019-10-07 (×9): 5000 [IU] via SUBCUTANEOUS
  Filled 2019-10-04 (×9): qty 1

## 2019-10-04 MED ORDER — LOSARTAN POTASSIUM 25 MG PO TABS
25.0000 mg | ORAL_TABLET | Freq: Every day | ORAL | Status: DC
  Administered 2019-10-04 – 2019-10-05 (×2): 25 mg via ORAL
  Filled 2019-10-04 (×3): qty 1

## 2019-10-04 MED ORDER — ONDANSETRON HCL 4 MG/2ML IJ SOLN: 4.0000 mg | Freq: Four times a day (QID) | INTRAMUSCULAR | Status: DC | PRN

## 2019-10-04 MED ORDER — SODIUM CHLORIDE 0.9 % IV SOLN: 250.0000 mL | INTRAVENOUS | Status: DC | PRN

## 2019-10-04 MED ORDER — FUROSEMIDE 10 MG/ML IJ SOLN
40.0000 mg | Freq: Once | INTRAMUSCULAR | Status: AC
  Administered 2019-10-04: 40 mg via INTRAVENOUS
  Filled 2019-10-04: qty 4

## 2019-10-04 MED ORDER — SPIRONOLACTONE 12.5 MG HALF TABLET
12.5000 mg | ORAL_TABLET | Freq: Every day | ORAL | Status: DC
  Administered 2019-10-04 – 2019-10-07 (×4): 12.5 mg via ORAL
  Filled 2019-10-04 (×4): qty 1

## 2019-10-04 MED ORDER — SODIUM CHLORIDE 0.9% FLUSH
3.0000 mL | INTRAVENOUS | Status: DC | PRN
  Administered 2019-10-06: 3 mL via INTRAVENOUS
  Filled 2019-10-04: qty 3

## 2019-10-04 MED ORDER — SODIUM CHLORIDE 0.9% FLUSH
3.0000 mL | Freq: Two times a day (BID) | INTRAVENOUS | Status: DC
  Administered 2019-10-05 – 2019-10-07 (×4): 3 mL via INTRAVENOUS

## 2019-10-04 MED ORDER — ACETAMINOPHEN 325 MG PO TABS: 650.0000 mg | ORAL_TABLET | ORAL | Status: DC | PRN

## 2019-10-04 NOTE — ED Notes (Addendum)
Pt ambulated with steady gait with no assitance needed. Pt started to get SOB and O2 was between 94-97%. O2 mostly stayed at 95% while ambulating.

## 2019-10-04 NOTE — ED Triage Notes (Signed)
Pt reports giving birth 7 weeks ago. Was preeclamptic during pregnancy and had been taking BP medication that has since been d/c at 6wk check up.  Anxiety reported today has been worse x1 week but has been slowly getting worse for months. Has never been prescribed medication for this.  Pt is visibly distressed and breathing quickly in room, states that she has been like this from her anxiety since 3a.

## 2019-10-04 NOTE — Progress Notes (Signed)
  Echocardiogram 2D Echocardiogram has been performed.  Robin Gallegos 10/04/2019, 5:33 PM

## 2019-10-04 NOTE — ED Notes (Signed)
ED Provider at bedside. 

## 2019-10-04 NOTE — ED Notes (Signed)
Breast pump ordered from materials for pt

## 2019-10-04 NOTE — ED Notes (Signed)
Pt sp02 90%, given 2L O2 for comfort and coached to breath slowly and controlled.

## 2019-10-04 NOTE — ED Triage Notes (Signed)
Patient complains of increased anxiety x 1 week. States that she just had baby, the pandemic, hyperventilating on arrival. Denies suicidal and homicidal thoughts

## 2019-10-04 NOTE — ED Provider Notes (Signed)
[facility] EMERGENCY DEPARTMENT Provider Note   CSN: [id number] Arrival date & time: 10/04/19  8588     History   Chief Complaint No chief complaint on file.   HPI Robin Gallegos is a 28 y.o. female.     HPI Patient presents with anxiety.  She is 7 weeks postpartum.  Had preeclampsia.  States over the last few days has been having anxiety episodes.  States she is more worried about dying.  States she had anxiety about having a baby and about potential exposure to Covid.  No anxiety for the last week.  No suicidal or homicidal thoughts.  No weight loss.  Otherwise healthy.  States the episodes last up to 4 hours but usually go away on their own.  States the one today has been lasting longer.  She is breast-feeding.  Around a week ago had a checkup and blood pressure medicine had been stopped.  Patient states she did develop a little bit of a cough today with some mild sputum production. Past Medical History:  Diagnosis Date  . Gestational diabetes mellitus (GDM)   . Pre-eclampsia     Patient Active Problem List   Diagnosis Date Noted  . SVD (spontaneous vaginal delivery) 08/13/2019  . Preeclampsia, third trimester 08/11/2019    History reviewed. No pertinent surgical history.   OB History    Gravida  1   Para  1   Term  1   Preterm  0   AB  0   Living  1     SAB  0   TAB  0   Ectopic  0   Multiple  0   Live Births  1            Home Medications    Prior to Admission medications   Not on File    Family History No family history on file.  Social History Social History   Tobacco Use  . Smoking status: Never Smoker  . Smokeless tobacco: Never Used  Substance Use Topics  . Alcohol use: Not Currently  . Drug use: Not Currently     Allergies   Penicillins   Review of Systems Review of Systems  Constitutional: Negative for appetite change.  HENT: Negative for congestion.   Respiratory: Positive for cough. Negative  for shortness of breath.   Cardiovascular: Negative for chest pain.  Gastrointestinal: Negative for abdominal distention.  Genitourinary: Negative for flank pain.  Musculoskeletal: Negative for back pain.  Neurological: Negative for numbness.  Psychiatric/Behavioral: Negative for dysphoric mood and suicidal ideas. The patient is nervous/anxious.      Physical Exam Updated Vital Signs BP (!) 130/114   Pulse (!) 116   Temp 98.3 F (36.8 C) (Oral)   Resp (!) 23   SpO2 97%   Physical Exam Vitals signs and nursing note reviewed.  HENT:     Head: Normocephalic.     Mouth/Throat:     Mouth: Mucous membranes are moist.  Eyes:     Extraocular Movements: Extraocular movements intact.  Neck:     Musculoskeletal: Neck supple.  Cardiovascular:     Rate and Rhythm: Tachycardia present.  Pulmonary:     Effort: No respiratory distress.     Comments: May have mild crackles at the bases. Abdominal:     Tenderness: There is no abdominal tenderness.  Skin:    General: Skin is warm.     Capillary Refill: Capillary refill takes less than 2 seconds.  Neurological:     Mental Status: She is alert and oriented to person, place, and time.  Psychiatric:     Comments: Patient is mildly anxious but appropriate      ED Treatments / Results  Labs (all labs ordered are listed, but only abnormal results are displayed) Labs Reviewed  CBC - Abnormal; Notable for the following components:      Result Value   WBC 11.7 (*)    RDW 15.7 (*)    Platelets 422 (*)    All other components within normal limits  COMPREHENSIVE METABOLIC PANEL - Abnormal; Notable for the following components:   Glucose, Bld 112 (*)    All other components within normal limits  BRAIN NATRIURETIC PEPTIDE - Abnormal; Notable for the following components:   B Natriuretic Peptide 823.6 (*)    All other components within normal limits  TROPONIN I (HIGH SENSITIVITY) - Abnormal; Notable for the following components:    Troponin I (High Sensitivity) 18 (*)    All other components within normal limits  TROPONIN I (HIGH SENSITIVITY) - Abnormal; Notable for the following components:   Troponin I (High Sensitivity) 21 (*)    All other components within normal limits  NOVEL CORONAVIRUS, NAA (HOSP ORDER, SEND-OUT TO REF LAB; TAT 18-24 HRS)  TSH    EKG EKG Interpretation  Date/Time:  Tuesday October 04 2019 08:12:15 EDT Ventricular Rate:  115 PR Interval:    QRS Duration: 86 QT Interval:  361 QTC Calculation: 500 R Axis:   38 Text Interpretation:  Sinus tachycardia Abnormal T, consider ischemia, diffuse leads Prolonged QT interval Confirmed by Davonna Belling [phone]) on 10/04/2019 8:14:42 AM Also confirmed by Davonna Belling [phone]), editor Hattie Perch ([id number])  on 10/04/2019 2:12:52 PM   Radiology Dg Chest 2 View  Result Date: 10/04/2019 CLINICAL DATA:  Shortness of breath EXAM: CHEST - 2 VIEW COMPARISON:  08/16/2019 FINDINGS: The heart size and mediastinal contours are within normal limits. Diffuse bilateral interstitial opacity and left basilar atelectasis or consolidation with a probable small pleural effusion. S shaped scoliosis of the thoracolumbar spine. IMPRESSION: Diffuse bilateral interstitial opacity and left basilar atelectasis or consolidation with a probable small pleural effusion. Findings are consistent with edema or infection. Electronically Signed   By: Eddie Candle M.D.   On: 10/04/2019 08:48    Procedures Procedures (including critical care time)  Medications Ordered in ED Medications  furosemide (LASIX) injection 40 mg (40 mg Intravenous Given 10/04/19 1414)     Initial Impression / Assessment and Plan / ED Course  I have reviewed the triage vital signs and the nursing notes.  Pertinent labs & imaging results that were available during my care of the patient were reviewed by me and considered in my medical decision making (see chart for details).        Patient  presented with potential anxiety.  States she is felt more nervous about having her baby and Covid.  She is 7 weeks postpartum.  However had one questionable pulse ox and had rales on exam.  X-ray showed potential edema and BNP is elevated troponin also mildly elevated.  Tachycardia.  Has hypertension of pregnancy.  Dr. Harrington Challenger attempted bedside echocardiogram but will get official echocardiogram. Also had outpatient Covid test ordered but if requires admission will require change of test.  Care turned over to Dr. Vanita Panda.  Final Clinical Impressions(s) / ED Diagnoses   Final diagnoses:  Anxiety    ED Discharge Orders    None  Benjiman Core, MD 10/04/19 [phone]

## 2019-10-04 NOTE — Consult Note (Addendum)
Cardiology Consultation:   Patient ID: Robin Gallegos MRN: [id number]; DOB: 1991/05/19  Admit date: 10/04/2019 Date of Consult: 10/04/2019  Primary Care Provider: Patient, No Pcp Per Primary Cardiologist:New    Patient Profile:   Robin Gallegos is a 28 y.o. female with a hx of preeclampsia who is being seen today for the evaluation of SOB  at the request of  Dr Alvino Chapel   History of Present Illness:   [name] is a 28 yo G1P1   She was admitted to [facility] on 08/11/19 with SOB / dizziness.  Hypertensive ta time   Mg started and labor induced.  She was sent home no 60 mg Adalat cc bid.    This was stopped at last clinc visit 1 week ago. Since then the pt says she has become weak   Very anxious   Sensation that she is going to die   She deniese CP   NO dizziness  No palpitations No F/C  Cough productive of mild sputme Came to ED  Did have covid exposure.  She and person with COVID were wearing mask.   The patient may have  had a COVD test that was neg   Not in system     Heart Pathway Score:     Past Medical History:  Diagnosis Date  . Gestational diabetes mellitus (GDM)   . Pre-eclampsia     History reviewed. No pertinent surgical history.     Inpatient Medications: Scheduled Meds:  Continuous Infusions:  PRN Meds:   Allergies:    Allergies  Allergen Reactions  . Penicillins Hives    Did it involve swelling of the face/tongue/throat, SOB, or low BP? Unknown Did it involve sudden or severe rash/hives, skin peeling, or any reaction on the inside of your mouth or nose? Unknown Did you need to seek medical attention at a hospital or doctor's office? Unknown When did it last happen?pt was a child If all above answers are "NO", may proceed with cephalosporin use.     Social History:   Social History   Socioeconomic History  . Marital status: Single    Spouse name: Not on file  . Number of children: Not on file  . Years of education: Not on file  .  Highest education level: Not on file  Occupational History  . Not on file  Social Needs  . Financial resource strain: Not on file  . Food insecurity    Worry: Not on file    Inability: Not on file  . Transportation needs    Medical: Not on file    Non-medical: Not on file  Tobacco Use  . Smoking status: Never Smoker  . Smokeless tobacco: Never Used  Substance and Sexual Activity  . Alcohol use: Not Currently  . Drug use: Not Currently  . Sexual activity: Yes  Lifestyle  . Physical activity    Days per week: Not on file    Minutes per session: Not on file  . Stress: Not on file  Relationships  . Social Herbalist on phone: Not on file    Gets together: Not on file    Attends religious service: Not on file    Active member of club or organization: Not on file    Attends meetings of clubs or organizations: Not on file    Relationship status: Not on file  . Intimate partner violence    Fear of current or ex partner: Not on file  Emotionally abused: Not on file    Physically abused: Not on file    Forced sexual activity: Not on file  Other Topics Concern  . Not on file  Social History Narrative  . Not on file    Family History:  Positive for HTN   NEg for CAD or CHF   ROS:  Please see the history of present illness.  All other ROS reviewed and negative.     Physical Exam/Data:   Vitals:   10/04/19 1530 10/04/19 1600 10/04/19 1830 10/04/19 1900  BP: (!) 130/114 (!) 124/99 (!) 124/92 (!) 130/96  Pulse: (!) 116 (!) 112 (!) 108 (!) 113  Resp: (!) 23 17 (!) 27 (!) 29  Temp:      TempSrc:      SpO2: 97% 99% 95% 96%    Intake/Output Summary (Last 24 hours) at 10/04/2019 2049 Last data filed at 10/04/2019 1435 Gross per 24 hour  Intake -  Output 550 ml  Net -550 ml   Last 3 Weights 08/11/2019 08/11/2019 06/30/2018  Weight (lbs) 229 lb 229 lb 1.6 oz 185 lb  Weight (kg) 103.874 kg 103.919 kg 83.915 kg     There is no height or weight on file to  calculate BMI.  General: OBese 28 yo  in no acute distress HEENT: normal Lymph: no adenopathy Neck: JVP is 10 cm Endocrine:  No thryomegaly Vascular: No carotid bruits; FA pulses 2+ bilaterally without bruits  Cardiac:  normal S1, S2; RRR; no murmur No S3  Tachy Lungs:  clear to auscultation bilaterally, no wheezing, rhonchi  Decreased BS at base Abd: soft, nontender, no hepatomegaly Obese Ext: no edema  Feet warm Musculoskeletal:  No deformities, BUE and BLE strength normal and equal Skin: warm and dry  Neuro:  CNs 2-12 intact, no focal abnormalities noted Psych:  Normal affect   EKG:  The EKG was personally reviewed and demonstrates:  Sinus tachycardia  T wave inverison  I, II, L  V4 to V6;   Prolonged QT   Telemetry:  Telemetry was personally reviewed and demonstrates:  ST  110s    Relevant CV Studies:   Laboratory Data:  High Sensitivity Troponin:   Recent Labs  Lab 10/04/19 0908 10/04/19 1108  TROPONINIHS 18* 21*     Chemistry Recent Labs  Lab 10/04/19 0805  NA 140  K 4.1  CL 107  CO2 22  GLUCOSE 112*  BUN 10  CREATININE 0.93  CALCIUM 9.3  GFRNONAA >60  GFRAA >60  ANIONGAP 11    Recent Labs  Lab 10/04/19 0805  PROT 6.9  ALBUMIN 3.7  AST 16  ALT 13  ALKPHOS 74  BILITOT 0.7   Hematology Recent Labs  Lab 10/04/19 0804  WBC 11.7*  RBC 4.72  HGB 12.4  HCT 38.4  MCV 81.4  MCH 26.3  MCHC 32.3  RDW 15.7*  PLT 422*   BNP Recent Labs  Lab 10/04/19 0908  BNP 823.6*    DDimer No results for input(s): DDIMER in the last 168 hours.   Radiology/Studies:  Dg Chest 2 View  Result Date: 10/04/2019 CLINICAL DATA:  Shortness of breath EXAM: CHEST - 2 VIEW COMPARISON:  08/16/2019 FINDINGS: The heart size and mediastinal contours are within normal limits. Diffuse bilateral interstitial opacity and left basilar atelectasis or consolidation with a probable small pleural effusion. S shaped scoliosis of the thoracolumbar spine. IMPRESSION: Diffuse  bilateral interstitial opacity and left basilar atelectasis or consolidation with a probable small pleural  effusion. Findings are consistent with edema or infection. Electronically Signed   By: Lauralyn Primes M.D.   On: 10/04/2019 08:48    Assessment and Plan:   Pt is a 28 yo who is postpartum 7 wks from first pregnancy PRegnancy complicated by preeclamspia  Presents to ED with complaints of weakness, anxiety On exam pt tachycardic   SOme mild volume increase EKG with signif ST changes, prolonged QT  Echo (limited, preliminary)  LVEF is severely depressed approximately 15 to 20%   Only basal lateral and anterior walls with signif thickening.   There is mild to moderate MR and mild TR  IMpression:   Peripartum cardiomyopathy with decompensated CHF  Plan:  Admit   Patient given IV lasix x 1  WIll start low dose Cozaar and aldacton Follow urine output, BP and HR, renal function      2  HTN   BP is mildly increased   Will follow as diurese Note:  She was on high doses of Adalat in pregnancy, after.  I am not convinced CHF is explained by uncontrolled HTN  3  Ob/GYN   Pt currently breast feeding and supplementing with formula.  WOuld recomm switching to formula 100% given medical Rx.   Patient understands, amenable to recommendations.  REviewed with [facility].  Pt will be seen by advanced heart failure servie in AM  For questions or updates, please contact [facility] Please consult [email or web address] for contact info under     Signed, Dietrich Pates, MD  10/04/2019 8:49 PM

## 2019-10-05 ENCOUNTER — Other Ambulatory Visit: Payer: Self-pay

## 2019-10-05 ENCOUNTER — Encounter (HOSPITAL_COMMUNITY): Payer: Self-pay | Admitting: *Deleted

## 2019-10-05 DIAGNOSIS — I429 Cardiomyopathy, unspecified: Secondary | ICD-10-CM

## 2019-10-05 LAB — BASIC METABOLIC PANEL
Anion gap: 9 (ref 5–15)
BUN: 12 mg/dL (ref 6–20)
CO2: 24 mmol/L (ref 22–32)
Calcium: 9 mg/dL (ref 8.9–10.3)
Chloride: 108 mmol/L (ref 98–111)
Creatinine, Ser: 1.13 mg/dL — ABNORMAL HIGH (ref 0.44–1.00)
GFR calc Af Amer: >60 mL/min (ref >60)
GFR calc non Af Amer: >60 mL/min (ref >60)
Glucose, Bld: 107 mg/dL — ABNORMAL HIGH (ref 70–99)
Potassium: 3.8 mmol/L (ref 3.5–5.1)
Sodium: 141 mmol/L (ref 135–145)

## 2019-10-05 LAB — NOVEL CORONAVIRUS, NAA (HOSP ORDER, SEND-OUT TO REF LAB; TAT 18-24 HRS): SARS-CoV-2, NAA: NOT DETECTED

## 2019-10-05 LAB — BRAIN NATRIURETIC PEPTIDE: B Natriuretic Peptide: 631.1 pg/mL — ABNORMAL HIGH (ref 0.0–100.0)

## 2019-10-05 LAB — ECHOCARDIOGRAM LIMITED

## 2019-10-05 MED ORDER — FUROSEMIDE 20 MG PO TABS
20.0000 mg | ORAL_TABLET | Freq: Every day | ORAL | Status: DC
  Administered 2019-10-05: 20 mg via ORAL
  Filled 2019-10-05: qty 1

## 2019-10-05 MED ORDER — CARVEDILOL 3.125 MG PO TABS
3.1250 mg | ORAL_TABLET | Freq: Two times a day (BID) | ORAL | Status: DC
  Administered 2019-10-06 – 2019-10-07 (×4): 3.125 mg via ORAL
  Filled 2019-10-05 (×4): qty 1

## 2019-10-05 NOTE — Consult Note (Addendum)
Advanced Heart Failure Team Consult Note   Primary Physician: Patient, No Pcp Per PCP-Cardiologist:  Dr. Tenny Craw  Reason for Consultation: New Systolic CHF,  Postpartum Cardiomyopathy   HPI:    Robin Gallegos is seen today for evaluation of new CHF ? Postpartum cardiomyopathy at the request of Dr. Tenny Craw, general cardiology   28 year old female G1P1, now 7 weeks post partum. Pregnancy was c/b by preeclampsia and gestational diabetes. She presented to [facility] at [redacted] weeks pregnant w/ SOB and dizziness on 08/11/19 and was admitted for IOL for preeclampsia. Delivered and health girl. Post delivery course was uncomplicated. She was discharged home on nifedipine for further BP management. This was discontinued by OB 1 week ago. Also of note, she has had recent contact w/ a COVID positive individual. Both pt and infected individual were wearing masks at time of contact.   She presented to the [facility] ED 10/20 w/ CC of dyspnea and palpitations. Felt like panic attacks. She was feeling anxious after finding out COVID exposure. Also SOB w/ ambulation. [facility], but continued to experience SOB, prompting ED evaluation.   In the ED, she was afebrile and COVID negative. CXR showed diffuse bilateral interstitial opacity and left basilar atelectasis or consolidation with a probable small pleural effusion. Findings  consistent with edema.  BNP 823. BP elevated at 155/113. Beside limited echo performed by general cardiology.  LVEF is severely depressed approximately 15 to 20%   Only basal lateral and anterior walls with signif thickening.   There is mild to moderate MR and mild TR. 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 She was started on IV Lasix, low dose Losartan and spironolactone. She reports good UOP overnight but only 500 cc of UOP documented, doubt accurate. Scr improved from 1.27>>1.13. K 3.8. BP improved at 109/71. Sinus tach low 100s on tele. No  adverse arrthymias.  She is feeling better. No current symptoms.   Besides preeclampsia and gestational diabetes, there is no other significant PMH. She reports her father mentioned that he was told he has a "weak heart" but no family h/o MI or SCD. Denies tobacco abuse. She was planning on breast feeding but given new medications, will use formula. Contraception method currently is condoms.    Echo (Limited) 10/04/19 Preliminary Report>>  LVEF is severely depressed approximately 15 to 20%   Only basal lateral and anterior walls with signif thickening.   There is mild to moderate MR and mild TR    Review of Systems: [y] = yes, [ ]  = no    General: Weight gain [ ] ; Weight loss [ ] ; Anorexia [ ] ; Fatigue [ ] ; Fever [ ] ; Chills [ ] ; Weakness [ ]    Cardiac: Chest pain/pressure [ ] ; Resting SOB [ ] ; Exertional SOB [Y ]; Orthopnea [ ] ; Pedal Edema [ ] ; Palpitations [Y ]; Syncope [ ] ; Presyncope [ ] ; Paroxysmal nocturnal dyspnea[ ]    Pulmonary: Cough [ ] ; Wheezing[ ] ; Hemoptysis[ ] ; Sputum [ ] ; Snoring [ ]    GI: Vomiting[ ] ; Dysphagia[ ] ; Melena[ ] ; Hematochezia [ ] ; Heartburn[ ] ; Abdominal pain [ ] ; Constipation [ ] ; Diarrhea [ ] ; BRBPR [ ]    GU: Hematuria[ ] ; Dysuria [ ] ; Nocturia[ ]    Vascular: Pain in legs with walking [ ] ; Pain in feet with lying flat [ ] ; Non-healing sores [ ] ; Stroke [ ] ; TIA [ ] ; Slurred speech [ ] ;   Neuro: Headaches[ ] ; Vertigo[ ] ;  Seizures[ ] ; Paresthesias[ ] ;Blurred vision [ ] ; Diplopia [ ] ; Vision changes [ ]    Ortho/Skin: Arthritis [ ] ; Joint pain [ ] ; Muscle pain [ ] ; Joint swelling [ ] ; Back Pain [ ] ; Rash [ ]    Psych: Depression[ ] ; Anxiety[ ]    Heme: Bleeding problems [ ] ; Clotting disorders [ ] ; Anemia [ ]    Endocrine: Diabetes [ ] ; Thyroid dysfunction[ ]   Home Medications Prior to Admission medications   Not on File    Past Medical History: Past Medical History:  Diagnosis Date   Gestational diabetes mellitus (GDM)    Pre-eclampsia      Past Surgical History: History reviewed. No pertinent surgical history.  Family History: History reviewed. No pertinent family history.  Social History: Social History   Socioeconomic History   Marital status: Single    Spouse name: Not on file   Number of children: Not on file   Years of education: Not on file   Highest education level: Not on file  Occupational History   Not on file  Social Needs   Financial resource strain: Not on file   Food insecurity    Worry: Not on file    Inability: Not on file   Transportation needs    Medical: Not on file    Non-medical: Not on file  Tobacco Use   Smoking status: Never Smoker   Smokeless tobacco: Never Used  Substance and Sexual Activity   Alcohol use: Not Currently   Drug use: Not Currently   Sexual activity: Yes  Lifestyle   Physical activity    Days per week: Not on file    Minutes per session: Not on file   Stress: Not on file  Relationships   Social connections    Talks on phone: Not on file    Gets together: Not on file    Attends religious service: Not on file    Active member of club or organization: Not on file    Attends meetings of clubs or organizations: Not on file    Relationship status: Not on file  Other Topics Concern   Not on file  Social History Narrative   Not on file    Allergies:  Allergies  Allergen Reactions   Penicillins Hives    Did it involve swelling of the face/tongue/throat, SOB, or low BP? Unknown Did it involve sudden or severe rash/hives, skin peeling, or any reaction on the inside of your mouth or nose? Unknown Did you need to seek medical attention at a hospital or doctor's office? Unknown When did it last happen?pt was a child If all above answers are NO, may proceed with cephalosporin use.     Objective:    Vital Signs:   Temp:  [97.9 F (36.6 C)-98.4 F (36.9 C)] 97.9 F (36.6 C) (10/21 0505) Pulse Rate:  [96-118] 98 (10/21  0505) Resp:  [16-29] 20 (10/21 0116) BP: (106-155)/(71-114) 109/71 (10/21 0505) SpO2:  [94 %-99 %] 95 % (10/21 0505) Weight:  [86.9 kg] 86.9 kg (10/21 0116) Last BM Date: 10/04/19  Weight change: Filed Weights   10/05/19 0116  Weight: 86.9 kg    Intake/Output:   Intake/Output Summary (Last 24 hours) at 10/05/2019 0754 Last data filed at 10/04/2019 1435 Gross per 24 hour  Intake --  Output 550 ml  Net -550 ml      Physical Exam    General:  Young obese female. No resp difficulty HEENT: normal Neck: supple. JVP . Carotids 2+ bilat;  no bruits. No lymphadenopathy or thyromegaly appreciated. Cor: PMI nondisplaced. Regular rate & rhythm. No rubs, gallops or murmurs. Lungs: clear Abdomen: soft, nontender, nondistended. No hepatosplenomegaly. No bruits or masses. Good bowel sounds. Extremities: no cyanosis, clubbing, rash, edema Neuro: alert & orientedx3, cranial nerves grossly intact. moves all 4 extremities w/o difficulty. Affect pleasant   Telemetry   Sinus tach, low 100s   EKG    Sinus tach 115 bpm, diffuse T wave abnormalities.   Labs   Basic Metabolic Panel: Recent Labs  Lab 10/04/19 0805 10/04/19 2053 10/05/19 0349  NA 140  --  141  K 4.1  --  3.8  CL 107  --  108  CO2 22  --  24  GLUCOSE 112*  --  107*  BUN 10  --  12  CREATININE 0.93 1.27* 1.13*  CALCIUM 9.3  --  9.0    Liver Function Tests: Recent Labs  Lab 10/04/19 0805  AST 16  ALT 13  ALKPHOS 74  BILITOT 0.7  PROT 6.9  ALBUMIN 3.7   No results for input(s): LIPASE, AMYLASE in the last 168 hours. No results for input(s): AMMONIA in the last 168 hours.  CBC: Recent Labs  Lab 10/04/19 0804 10/04/19 2053  WBC 11.7* 8.5  HGB 12.4 13.1  HCT 38.4 40.1  MCV 81.4 79.7*  PLT 422* 405*    Cardiac Enzymes: No results for input(s): CKTOTAL, CKMB, CKMBINDEX, TROPONINI in the last 168 hours.  BNP: BNP (last 3 results) Recent Labs    10/04/19 0908 10/05/19 0349  BNP 823.6* 631.1*     ProBNP (last 3 results) No results for input(s): PROBNP in the last 8760 hours.   CBG: No results for input(s): GLUCAP in the last 168 hours.  Coagulation Studies: No results for input(s): LABPROT, INR in the last 72 hours.   Imaging   Dg Chest 2 View  Result Date: 10/04/2019 CLINICAL DATA:  Shortness of breath EXAM: CHEST - 2 VIEW COMPARISON:  08/16/2019 FINDINGS: The heart size and mediastinal contours are within normal limits. Diffuse bilateral interstitial opacity and left basilar atelectasis or consolidation with a probable small pleural effusion. S shaped scoliosis of the thoracolumbar spine. IMPRESSION: Diffuse bilateral interstitial opacity and left basilar atelectasis or consolidation with a probable small pleural effusion. Findings are consistent with edema or infection. Electronically Signed   By: Lauralyn Primes M.D.   On: 10/04/2019 08:48      Medications:     Current Medications:  heparin  5,000 Units Subcutaneous Q8H   losartan  25 mg Oral Daily   sodium chloride flush  3 mL Intravenous Q12H   spironolactone  12.5 mg Oral Daily     Infusions:  sodium chloride         Patient Profile   28 year old female G1P1, now 7 weeks post partum w/ pregnancy c/b by preeclampsia and gestational diabetes, delivered at 38 weeks but no other PMH, admitted for acute CHF w/ limited echo revealing severely depressed LVEF, approximately 15 to 20%.   Assessment/Plan   1. 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 Was hypertensive on admit 155/113. Limited echo reveals severely depressed LVEF, approximately 15 to 20%. Only basal lateral and anterior walls with signif thickening.  There is mild to moderate MR and mild TR. Symptomatic w/ exertional dyspnea. Admit BNP 823. Symptoms improved w/ IV Lasix. BP improved after addition of Losartan and Spiro. SCr and K stable.  HIV negative.   -Continue Losartan 25 mg daily>> If BP tolerates, consider transition to Entresto.  -We discussed importance of contraception given teratogenic potential of ARB. Contraception method currently is condom use. She understands importance of avoiding pregancy. Plans to use formula going forward -Continue spironolactone 12.5 -Will eventually need repeat echo after 3 months of guidelines medical therapy to reassess EF -Also consider outpatient sleep study   MD to follow with further recommendations.    Length of Stay: 1  Robbie LisBrittainy Simmons, PA-C  10/05/2019, 7:54 AM  Advanced Heart Failure Team Pager [phone] (M-F; 7a - 4p)  Please contact [facility] for night-coverage after hours (4p -7a ) and weekends on [email or web address]  Patient seen with PA, agree with the above note.  Patient is 7 weeks post-partum with her first child, pregnancy complicated by pre-eclampsia and gestational diabetes.  She recently developed shortness of breath with exertion, was evaluated in the ER and found to have pulmonary edema on CXR and elevated BNP.  Echo showed EF 20% range, diffuse hypokinesis, with mild MR.  She received IV Lasix and feels much better this morning.   Today, patient denies dyspnea, says she feels back to normal.   General: NAD Neck: No JVD, no thyromegaly or thyroid nodule.  Lungs: Clear to auscultation bilaterally with normal respiratory effort. CV: Nondisplaced PMI.  Heart regular S1/S2, no S3/S4, no murmur.  No peripheral edema.  No carotid bruit.  Normal pedal pulses.  Abdomen: Soft, nontender, no hepatosplenomegaly, no distention.  Skin: Intact without lesions or rashes.  Neurologic: Alert and oriented x 3.  Psych: Normal affect. Extremities: No clubbing or cyanosis.  HEENT: Normal.   I agree that peri-partum cardiomyopathy is the most likely diagnosis.  RFs include African-American, gestational diabetes, and pre-eclampsia. She is HIV negative.  No FH of cardiomyopathy or premature  coronary disease.  She has no baseline coronary risk factors and has had no chest pain.  Slight hs-TnI elevation (18=>21) consistent with CHF, I do not think this represents SCAD (no clear regionality to wall motion abnormalities). She was COVID-19 negative last week, waiting for in-hospital test.  On exam, she is not volume overloaded.  Echo with EF 20% with diffuse hypokinesis.  - Continue losartan 25 mg daily and spironolactone 12.5 daily.  - Volume status looks ok, will start Coreg 3.125 mg bid.  - Start Lasix 20 mg daily.  - Would repeat echo in 3 months to look for recovery.  If this is indeed peri-partum CMP as I expect, there is a good chance that she will significantly improve.  - We discussed the need for contraception while on cardiac meds, and we briefly discussed risk from recurrent pregnancy if she recovers completely or does not recover completely.   - She will not be breast feeding.   Robin Gallegos 10/05/2019 10:23 AM

## 2019-10-05 NOTE — ED Notes (Signed)
Family at bedside. 

## 2019-10-06 LAB — BASIC METABOLIC PANEL
Anion gap: 9 (ref 5–15)
BUN: 19 mg/dL (ref 6–20)
CO2: 25 mmol/L (ref 22–32)
Calcium: 9.3 mg/dL (ref 8.9–10.3)
Chloride: 109 mmol/L (ref 98–111)
Creatinine, Ser: 1.1 mg/dL — ABNORMAL HIGH (ref 0.44–1.00)
GFR calc Af Amer: >60 mL/min (ref >60)
GFR calc non Af Amer: >60 mL/min (ref >60)
Glucose, Bld: 108 mg/dL — ABNORMAL HIGH (ref 70–99)
Potassium: 3.7 mmol/L (ref 3.5–5.1)
Sodium: 143 mmol/L (ref 135–145)

## 2019-10-06 MED ORDER — FUROSEMIDE 10 MG/ML IJ SOLN
40.0000 mg | Freq: Once | INTRAMUSCULAR | Status: AC
  Administered 2019-10-06: 40 mg via INTRAVENOUS
  Filled 2019-10-06: qty 4

## 2019-10-06 MED ORDER — SACUBITRIL-VALSARTAN 24-26 MG PO TABS
1.0000 | ORAL_TABLET | Freq: Two times a day (BID) | ORAL | Status: DC
  Administered 2019-10-06 – 2019-10-07 (×5): 1 via ORAL
  Filled 2019-10-06 (×4): qty 1

## 2019-10-06 MED ORDER — POTASSIUM CHLORIDE CRYS ER 20 MEQ PO TBCR
40.0000 meq | EXTENDED_RELEASE_TABLET | Freq: Once | ORAL | Status: AC
  Administered 2019-10-06: 40 meq via ORAL
  Filled 2019-10-06: qty 2

## 2019-10-06 NOTE — Progress Notes (Addendum)
Advanced Heart Failure Rounding Note  PCP-Cardiologist: No primary care provider on file.   Subjective:    Yesterday carvedilol and lasix started. She did not received pm coreg dose.   Over night and with ambulation having some shortness of breath.    Objective:   Weight Range: 86.5 kg Body mass index is 32.73 kg/m.   Vital Signs:   Temp:  [97.6 F (36.4 C)-98 F (36.7 C)] 97.8 F (36.6 C) (10/22 0523) Pulse Rate:  [85-103] 93 (10/22 0523) Resp:  [20] 20 (10/22 0523) BP: (109-125)/(77-99) 109/77 (10/22 0523) SpO2:  [97 %-100 %] 97 % (10/22 0523) Weight:  [86.5 kg] 86.5 kg (10/22 0523) Last BM Date: 10/04/19  Weight change: Filed Weights   10/05/19 0116 10/06/19 0523  Weight: 86.9 kg 86.5 kg    Intake/Output:   Intake/Output Summary (Last 24 hours) at 10/06/2019 0751 Last data filed at 10/05/2019 1700 Gross per 24 hour  Intake 0 ml  Output 150 ml  Net -150 ml      Physical Exam    General:   No resp difficulty HEENT: Normal Neck: Supple. JVP 10-11 . Carotids 2+ bilat; no bruits. No lymphadenopathy or thyromegaly appreciated. Cor: PMI nondisplaced. Regular rate & rhythm. No rubs, gallops or murmurs. Lungs: Clear Abdomen: Soft, nontender, nondistended. No hepatosplenomegaly. No bruits or masses. Good bowel sounds. Extremities: No cyanosis, clubbing, rash, edema Neuro: Alert & orientedx3, cranial nerves grossly intact. moves all 4 extremities w/o difficulty. Affect pleasant   Telemetry   NSR 80s personally reviewed/   EKG    N/A  Labs    CBC Recent Labs    10/04/19 0804 10/04/19 2053  WBC 11.7* 8.5  HGB 12.4 13.1  HCT 38.4 40.1  MCV 81.4 79.7*  PLT 422* 405*   Basic Metabolic Panel Recent Labs    40/97/35 0349 10/06/19 0234  NA 141 143  K 3.8 3.7  CL 108 109  CO2 24 25  GLUCOSE 107* 108*  BUN 12 19  CREATININE 1.13* 1.10*  CALCIUM 9.0 9.3   Liver Function Tests Recent Labs    10/04/19 0805  AST 16  ALT 13  ALKPHOS 74   BILITOT 0.7  PROT 6.9  ALBUMIN 3.7   No results for input(s): LIPASE, AMYLASE in the last 72 hours. Cardiac Enzymes No results for input(s): CKTOTAL, CKMB, CKMBINDEX, TROPONINI in the last 72 hours.  BNP: BNP (last 3 results) Recent Labs    10/04/19 0908 10/05/19 0349  BNP 823.6* 631.1*    ProBNP (last 3 results) No results for input(s): PROBNP in the last 8760 hours.   D-Dimer No results for input(s): DDIMER in the last 72 hours. Hemoglobin A1C No results for input(s): HGBA1C in the last 72 hours. Fasting Lipid Panel No results for input(s): CHOL, HDL, LDLCALC, TRIG, CHOLHDL, LDLDIRECT in the last 72 hours. Thyroid Function Tests Recent Labs    10/04/19 0804  TSH 1.379    Other results:   Imaging     No results found.   Medications:     Scheduled Medications: . carvedilol  3.125 mg Oral BID WC  . furosemide  20 mg Oral Daily  . heparin  5,000 Units Subcutaneous Q8H  . losartan  25 mg Oral Daily  . sodium chloride flush  3 mL Intravenous Q12H  . spironolactone  12.5 mg Oral Daily     Infusions: . sodium chloride       PRN Medications:  sodium chloride, acetaminophen, ondansetron (ZOFRAN)  IV, sodium chloride flush    Assessment/Plan   1. 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 ECHO EF 20%  RFs include African-American, gestational diabetes, and pre-eclampsia. She is HIV negative.  No FH of cardiomyopathy or premature coronary disease.  She has no baseline coronary risk factors and has had no chest pain.  Slight hs-TnI elevation (18=>21) consistent with CHF, I do not think this represents SCAD (no clear regionality to wall motion abnormalities). She was COVID-19 negative last week, and negative this hospitalization. . - Volume status elevated. Give 40 mg IV lasix x1. Stop po lasix.  - continue carvedilol 3.125 mg twice a day. For some reason she did not get carvedilol?  - Continue losartan 25 mg daily - Continue 12.5 mg spironolactone  daily.  - Renal function stable.  - Consult cardiac rehab.  - Discussed the need for contraception while on cardiac meds, and we briefly discussed risk from recurrent pregnancy if she recovers completely or does not recover completely.   - She will not be breast feeding  Length of Stay: 2  Darrick Grinder, NP  10/06/2019, 7:51 AM  Advanced Heart Failure Team Pager [phone] (M-F; 7a - 4p)  Please contact [facility] for night-coverage after hours (4p -7a ) and weekends on [email or web address]  Patient seen with NP, agree with the above note.   Still short of breath walking.  No lightheadedness, BP stable.   General: NAD Neck: JVP 12 cm, mild diffuse thyroid enlargement. .  Lungs: Clear to auscultation bilaterally with normal respiratory effort. CV: Nondisplaced PMI.  Heart regular S1/S2, +S3, no murmur.  Trace ankle edema.   Abdomen: Soft, nontender, no hepatosplenomegaly, no distention.  Skin: Intact without lesions or rashes.  Neurologic: Alert and oriented x 3.  Psych: Normal affect. Extremities: No clubbing or cyanosis.  HEENT: Normal.   She still appears volume overloaded and is short of breath.  - Will give Lasix 40 mg IV bid today and reassess.  - Tolerated losartan 25 mg daily yesterday, transition to Entresto 24/26 bid.  - Continue spironolactone and low dose Coreg.  - As above, discussed contraception and breast feeding again.   Diffuse thyroid enlargement on exam but TSH was normal.   Loralie Champagne 10/06/2019 11:21 AM

## 2019-10-06 NOTE — Care Management (Signed)
1110 10-06-19 CM received consult for Heart Failure [place]- Pt continues on IV Lasix. CM will continue to follow for transition of care needs. Bethena Roys, RN,BSN Case Manager [phone]

## 2019-10-06 NOTE — Care Management (Addendum)
1130 10-06-19 Patient listed as having Medicaid in the system- CM did speak with patient and she has [employer]. New insurance card sent to admitting. CM did submit benefits check for Entresto. CM will make patient aware of cost once completed. CM will follow for additional transition of care needs. Bethena Roys, RN,BSN Case Manager [phone]

## 2019-10-06 NOTE — Care Management (Signed)
PerTammy L. with [facility] Co-pay amount for Entresto 24-26 mg bid for a 30 day supply is $154.07. No Generic brand covered (sacubitril -valsartan).  Mail-order price will be $158.45.  No PA required No deductible Teir 2 medication  [facility]ail [facility] in [place] [facility].

## 2019-10-06 NOTE — Progress Notes (Signed)
CARDIAC REHAB PHASE I   PRE:  Rate/Rhythm: 95 SR  BP:  Supine:   Sitting: 110/72  Standing:    SaO2: 99%RA  MODE:  Ambulation: 470 ft   POST:  Rate/Rhythm: 114 ST  BP:  Supine:   Sitting: 110/78  Standing:    SaO2: 99%RA 1400-1455 Pt walked 470 ft on RA with steady gait. Stopped once to catch her breath and sats at 99%. Tolerated well. Gave pt CHF booklet and reviewed zones and when to call MD. Pt has scales at home and we discussed daily weights. Gave low sodium diets and we discussed 2000 mg restriction. Discussed with pt that we usually have 2L FR but to ask cardiology since she has been breast feeding and pumping.  Discussed walking for ex and CRP 2. Pt is interested in program so referral to [facility] made. Pt encouraged to look over CHF booklet and diets.   Graylon Good, RN BSN  10/06/2019 2:52 PM

## 2019-10-07 ENCOUNTER — Ambulatory Visit: Payer: Medicaid Other | Admitting: Cardiovascular Disease

## 2019-10-07 ENCOUNTER — Telehealth (HOSPITAL_COMMUNITY): Payer: Self-pay | Admitting: Pharmacy Technician

## 2019-10-07 DIAGNOSIS — I5021 Acute systolic (congestive) heart failure: Secondary | ICD-10-CM

## 2019-10-07 LAB — BASIC METABOLIC PANEL
Anion gap: 13 (ref 5–15)
BUN: 17 mg/dL (ref 6–20)
CO2: 19 mmol/L — ABNORMAL LOW (ref 22–32)
Calcium: 9.4 mg/dL (ref 8.9–10.3)
Chloride: 106 mmol/L (ref 98–111)
Creatinine, Ser: 1.12 mg/dL — ABNORMAL HIGH (ref 0.44–1.00)
GFR calc Af Amer: >60 mL/min (ref >60)
GFR calc non Af Amer: >60 mL/min (ref >60)
Glucose, Bld: 82 mg/dL (ref 70–99)
Potassium: 3.8 mmol/L (ref 3.5–5.1)
Sodium: 138 mmol/L (ref 135–145)

## 2019-10-07 MED ORDER — FUROSEMIDE 40 MG PO TABS
40.0000 mg | ORAL_TABLET | Freq: Every day | ORAL | Status: DC
  Administered 2019-10-07: 40 mg via ORAL
  Filled 2019-10-07: qty 1

## 2019-10-07 MED ORDER — SPIRONOLACTONE 25 MG PO TABS: 25.0000 mg | ORAL_TABLET | Freq: Every day | ORAL | Status: DC

## 2019-10-07 MED ORDER — CARVEDILOL 3.125 MG PO TABS: 3.1250 mg | ORAL_TABLET | Freq: Two times a day (BID) | ORAL | 6 refills | Status: DC

## 2019-10-07 MED ORDER — SACUBITRIL-VALSARTAN 24-26 MG PO TABS: 1.0000 | ORAL_TABLET | Freq: Two times a day (BID) | ORAL | 11 refills | Status: DC

## 2019-10-07 MED ORDER — SPIRONOLACTONE 25 MG PO TABS: 25.0000 mg | ORAL_TABLET | Freq: Every day | ORAL | 6 refills | Status: DC

## 2019-10-07 MED ORDER — FUROSEMIDE 40 MG PO TABS: 40.0000 mg | ORAL_TABLET | Freq: Every day | ORAL | 6 refills | Status: DC

## 2019-10-07 MED FILL — ENTRESTO 24 MG-26 MG TABLET: 24-26 | 30 days supply | Qty: 60 | Fill #0

## 2019-10-07 NOTE — Plan of Care (Signed)
  Problem: Pain Managment: Goal: General experience of comfort will improve Outcome: Completed/Met

## 2019-10-07 NOTE — Progress Notes (Signed)
Patient ID: Robin Gallegos, female   DOB: 1991-08-22, 28 y.o.   MRN: [id number]     Advanced Heart Failure Rounding Note  PCP-Cardiologist: No primary care provider on file.   Subjective:    Breathing much improved today, she feels like she is back to normal.  Walked in hall with no problems.   Objective:   Weight Range: 87.1 kg Body mass index is 32.96 kg/m.   Vital Signs:   Temp:  [97.7 F (36.5 C)-98.1 F (36.7 C)] 98.1 F (36.7 C) (10/23 0624) Pulse Rate:  [62-86] 86 (10/23 0624) Resp:  [18-20] 18 (10/23 0624) BP: (90-103)/(68-73) 103/73 (10/23 0827) SpO2:  [99 %-100 %] 99 % (10/23 0624) Weight:  [87.1 kg] 87.1 kg (10/23 0624) Last BM Date: 10/04/19  Weight change: Filed Weights   10/05/19 0116 10/06/19 0523 10/07/19 0624  Weight: 86.9 kg 86.5 kg 87.1 kg    Intake/Output:   Intake/Output Summary (Last 24 hours) at 10/07/2019 1233 Last data filed at 10/06/2019 2319 Gross per 24 hour  Intake 243 ml  Output -  Net 243 ml      Physical Exam    General: NAD Neck: No JVD, prominent thyroid Lungs: Clear to auscultation bilaterally with normal respiratory effort. CV: Nondisplaced PMI.  Heart regular S1/S2, no S3/S4, no murmur.  No peripheral edema.  No carotid bruit.  Normal pedal pulses.  Abdomen: Soft, nontender, no hepatosplenomegaly, no distention.  Skin: Intact without lesions or rashes.  Neurologic: Alert and oriented x 3.  Psych: Normal affect. Extremities: No clubbing or cyanosis.  HEENT: Normal.    Telemetry   NSR 90s personally reviewed  EKG    N/A  Labs    CBC Recent Labs    10/04/19 2053  WBC 8.5  HGB 13.1  HCT 40.1  MCV 79.7*  PLT 405*   Basic Metabolic Panel Recent Labs    50/09/38 0234 10/07/19 0305  NA 143 138  K 3.7 3.8  CL 109 106  CO2 25 19*  GLUCOSE 108* 82  BUN 19 17  CREATININE 1.10* 1.12*  CALCIUM 9.3 9.4   Liver Function Tests No results for input(s): AST, ALT, ALKPHOS, BILITOT, PROT, ALBUMIN in the last  72 hours. No results for input(s): LIPASE, AMYLASE in the last 72 hours. Cardiac Enzymes No results for input(s): CKTOTAL, CKMB, CKMBINDEX, TROPONINI in the last 72 hours.  BNP: BNP (last 3 results) Recent Labs    10/04/19 0908 10/05/19 0349  BNP 823.6* 631.1*    ProBNP (last 3 results) No results for input(s): PROBNP in the last 8760 hours.   D-Dimer No results for input(s): DDIMER in the last 72 hours. Hemoglobin A1C No results for input(s): HGBA1C in the last 72 hours. Fasting Lipid Panel No results for input(s): CHOL, HDL, LDLCALC, TRIG, CHOLHDL, LDLDIRECT in the last 72 hours. Thyroid Function Tests No results for input(s): TSH, T4TOTAL, T3FREE, THYROIDAB in the last 72 hours.  Invalid input(s): FREET3  Other results:   Imaging    No results found.   Medications:     Scheduled Medications: . carvedilol  3.125 mg Oral BID WC  . furosemide  40 mg Oral Daily  . heparin  5,000 Units Subcutaneous Q8H  . sacubitril-valsartan  1 tablet Oral BID  . sodium chloride flush  3 mL Intravenous Q12H  . [START ON 10/08/2019] spironolactone  25 mg Oral Daily    Infusions: . sodium chloride      PRN Medications: sodium chloride, acetaminophen, ondansetron (ZOFRAN)  IV, sodium chloride flush    Assessment/Plan   1. 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 ECHO EF 20%.  RFs include African-American, gestational diabetes, and pre-eclampsia. She is HIV negative.  No FH of cardiomyopathy or premature coronary disease.  She has no baseline coronary risk factors and has had no chest pain.  Slight hs-TnI elevation (18=>21) consistent with CHF, I do not think this represents SCAD (no clear regionality to wall motion abnormalities). She was COVID-19 negative last week, and negative this hospitalization. TSH normal.  Today, she looks euvolemic on exam after IV Lasix again yesterday.  SBP 110s, tolerating Entresto. - Start Lasix 40 mg po daily, will see in followup in CHF clinic  and may be able to decrease.  - Continue carvedilol 3.125 mg twice a day. - Continue Entresto 24/26 bid.  - Increase spironolactone to 25 mg daily.   - Discussed the need for contraception while on cardiac meds, and we briefly discussed risk from recurrent pregnancy if she recovers completely or does not recover completely.   - She will not be breast feeding - She will need repeat echo in 3 months to assess for recovery.  2. Thyromegaly: TSH normal.   We will see her in CHF clinic.  Meds for home: Lasix 40 mg daily, spironolactone 25 mg daily, Entresto 24/26 bid, Coreg 3.125 mg bid.   Length of Stay: 3  Loralie Champagne, MD  10/07/2019, 12:33 PM  Advanced Heart Failure Team Pager [phone] (M-F; 7a - 4p)  Please contact [facility] for night-coverage after hours (4p -7a ) and weekends on [email or web address]

## 2019-10-07 NOTE — Telephone Encounter (Signed)
Patient Advocate Encounter  Received notification that would need assistance with her co-pay of Entresto 24-26mg . Patient's co-pay through insurance $267.54.  Was able to obtain a $3,250 co-pay card for patient, leaving her a $10 co-pay.  Pharmacy Billing Information  BIN: [id number]  PCN: [id number]  Group: [id number]  ID: [id number]  Put into pharmacy system for future use.

## 2019-10-07 NOTE — Discharge Summary (Addendum)
Advanced Heart Failure Team  Discharge Summary   Patient ID: Robin Gallegos MRN: [id number], DOB/AGE: Apr 25, 1991 28 y.o. Admit date: 10/04/2019 D/C date:     10/07/2019   Primary Discharge Diagnoses:  1. 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 ECHO EF 20% 2. [facility] Course:  Robin Gallegos is a 28 year old female G1P1, 7 weeks post partum. Pregnancy was c/b by preeclampsia and gestational diabetes. She presented to [facility] at [redacted] weeks pregnant w/ SOB and dizziness on 08/11/19 and was admitted for IOL for preeclampsia. Delivered and health girl. Post delivery course was uncomplicated. She was discharged home on nifedipine for further BP management. This was discontinued by OB 1 week ago. Also of note, she has had recent contact w/ a COVID positive individual. Both pt and infected individual were wearing masks at time of contact.   She presented to the [facility] ED 10/20 w/ CC of dyspnea and palpitations. ECHo was completed and showed reduced EF at 20%. HF Team consulted to manage Acute Systolic Heart Failure likely in the setting of peri partum cardiomyopathy.   She will continue to be followed closely in the HF clinic. Plan to check BMET next week and start patient assistance for entresto if needed.   See below for detailed discharge summary.    1. 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 ECHO EF 20%.  RFs include African-American, gestational diabetes, and pre-eclampsia. She is HIV negative. No FH of cardiomyopathy or premature coronary disease. She has no baseline coronary risk factors and has had no chest pain. Slight hs-TnI elevation (18=>21) consistent with CHF, I do not think this represents SCAD (no clear regionality to wall motion abnormalities). She was COVID-19 negative last week, and negative this hospitalization. TSH normal.   Diuresed with IV lasix and transitioned to lasix 40 mg po daily. Renal function was followed closely. HF medications initiated.  - Continue  carvedilol 3.125 mg twice a day. - Continue Entresto 24/26 bid.  - Continue  spironolactone to 25 mg daily.   - Discussed the need for contraception while on cardiac meds, and we briefly discussed risk from recurrent pregnancy if she recovers completely or does not recover completely.  - She will not be breast feeding - Plan to to repeat echo in 3 months to assess for recovery.  2. Thyromegaly: TSH normal.    Discharge Vitals: Blood pressure 104/74, pulse 94, temperature 97.9 F (36.6 C), temperature source Oral, resp. rate 19, height 5\' 4"  (1.626 m), weight 87.1 kg, SpO2 99 %, unknown if currently breastfeeding.  Labs: Lab Results  Component Value Date   WBC 8.5 10/04/2019   HGB 13.1 10/04/2019   HCT 40.1 10/04/2019   MCV 79.7 (L) 10/04/2019   PLT 405 (H) 10/04/2019    Recent Labs  Lab 10/04/19 0805  10/07/19 0305  NA 140   < > 138  K 4.1   < > 3.8  CL 107   < > 106  CO2 22   < > 19*  BUN 10   < > 17  CREATININE 0.93   < > 1.12*  CALCIUM 9.3   < > 9.4  PROT 6.9  --   --   BILITOT 0.7  --   --   ALKPHOS 74  --   --   ALT 13  --   --   AST 16  --   --   GLUCOSE 112*   < > 82   < > =  values in this interval not displayed.   No results found for: CHOL, HDL, LDLCALC, TRIG BNP (last 3 results) Recent Labs    10/04/19 0908 10/05/19 0349  BNP 823.6* 631.1*    ProBNP (last 3 results) No results for input(s): PROBNP in the last 8760 hours.   Diagnostic Studies/Procedures   No results found.  Discharge Medications   Allergies as of 10/07/2019      Reactions   Penicillins Hives   Did it involve swelling of the face/tongue/throat, SOB, or low BP? Unknown Did it involve sudden or severe rash/hives, skin peeling, or any reaction on the inside of your mouth or nose? Unknown Did you need to seek medical attention at a hospital or doctor's office? Unknown When did it last happen?pt was a child If all above answers are "NO", may proceed with cephalosporin use.       Medication List    TAKE these medications   carvedilol 3.125 MG tablet Commonly known as: COREG Take 1 tablet (3.125 mg total) by mouth 2 (two) times daily with a meal. Start taking on: October 08, 2019   furosemide 40 MG tablet Commonly known as: LASIX Take 1 tablet (40 mg total) by mouth daily. Start taking on: October 08, 2019   sacubitril-valsartan 24-26 MG Commonly known as: ENTRESTO Take 1 tablet by mouth 2 (two) times daily.   spironolactone 25 MG tablet Commonly known as: ALDACTONE Take 1 tablet (25 mg total) by mouth daily. Start taking on: October 08, 2019       Disposition   The patient will be discharged in stable condition to home. Discharge Instructions    (HEART FAILURE PATIENTS) Call MD:  Anytime you have any of the following symptoms: 1) 3 pound weight gain in 24 hours or 5 pounds in 1 week 2) shortness of breath, with or without a dry hacking cough 3) swelling in the hands, feet or stomach 4) if you have to sleep on extra pillows at night in order to breathe.   Complete by: As directed    Amb Referral to Cardiac Rehabilitation   Complete by: As directed    Diagnosis: Heart Failure (see criteria below if ordering Phase II)   Heart Failure Type: Chronic Systolic   After initial evaluation and assessments completed: Virtual Based Care may be provided alone or in conjunction with Phase 2 Cardiac Rehab based on patient barriers.: Yes   Diet - low sodium heart healthy   Complete by: As directed    Heart Failure patients record your daily weight using the same scale at the same time of day   Complete by: As directed    Increase activity slowly   Complete by: As directed      Follow-up Information     [facility]. Go on 10/14/2019.   Specialty: Cardiology Why: Entrance C[facility], parking code [id number]  Friday 10/30 at 10:00 AM, with Boyce Medici, PA  Monday 11/9 at 11:00 AM in Pharmacy  Clinic, parking code [id number] Contact information: [address] [id number] mc [place] [address] [phone]            Duration of Discharge Encounter: Greater than 35 minutes   Signed, Tonye Becket  NP-C  10/07/2019, 6:01 PM   Patient seen earlier today by Dr. Shirlee Latch and deemed stable for d/c. F/u in HF Clinic.   Arvilla Meres, MD  11:24 AM

## 2019-10-07 NOTE — Plan of Care (Signed)
  Problem: Education: Goal: Knowledge of General Education information will improve Description: Including pain rating scale, medication(s)/side effects and non-pharmacologic comfort measures Outcome: Progressing   Problem: Activity: Goal: Risk for activity intolerance will decrease Outcome: Progressing   Problem: Nutrition: Goal: Adequate nutrition will be maintained Outcome: Progressing   

## 2019-10-07 NOTE — Progress Notes (Signed)
CARDIAC REHAB PHASE I   PRE:  Rate/Rhythm: 97 SR  BP:  Supine:   Sitting: 112/79  Standing:    SaO2: 99%RA  MODE:  Ambulation: 470 ft   POST:  Rate/Rhythm: 113 ST  BP:  Supine: \  Sitting: 113/80  Standing:    SaO2: 98%RA 0911-0940 Pt walked 470 ft on RA with steady gait and did not need to rest. Stated breathing is getting better. Gave ex ed which is walking instructions. Pt has read CHF materials and no questions. Can walk independently so will sign off.   Graylon Good, RN BSN  10/07/2019 9:34 AM

## 2019-10-12 ENCOUNTER — Telehealth (HOSPITAL_COMMUNITY): Payer: Self-pay

## 2019-10-12 NOTE — Telephone Encounter (Signed)
Attempted to call patient in regards to Cardiac Rehab - LM on VM 

## 2019-10-12 NOTE — Telephone Encounter (Signed)
Pt insurance is active and benefits verified through [employer]. Co-pay $0.00, DED $3,000.00/$2,190.00 met, out of pocket $6,000.00/$2,705.71 met, co-insurance 30%. No pre-authorization required. Kennyth Lose B./[employer], 10/12/2019 @ 229PM, REF# [id number]  Pt insurance is active through Medicaid. VQS#[id number]  Will fax over Medicaid Reimbursement form to Dr. Harrington Challenger  Will contact patient to see if she is interested in the Cardiac Rehab Program. If interested, patient will need to complete follow up appt. Once completed, patient will be contacted for scheduling upon review by the RN Navigator.

## 2019-10-14 ENCOUNTER — Other Ambulatory Visit: Payer: Self-pay

## 2019-10-14 ENCOUNTER — Ambulatory Visit (HOSPITAL_COMMUNITY)
Admission: RE | Admit: 2019-10-14 | Discharge: 2019-10-14 | Disposition: A | Discharge status: Home / Self Care | Payer: 59 | Source: Ambulatory Visit | Attending: Cardiology | Admitting: Cardiology

## 2019-10-14 ENCOUNTER — Encounter (HOSPITAL_COMMUNITY): Payer: 59

## 2019-10-14 ENCOUNTER — Encounter (HOSPITAL_COMMUNITY): Payer: Self-pay

## 2019-10-14 VITALS — BP 110/84 | HR 100 | Wt 195.8 lb

## 2019-10-14 DIAGNOSIS — O903 Peripartum cardiomyopathy: Secondary | ICD-10-CM

## 2019-10-14 DIAGNOSIS — Z880 Allergy status to penicillin: Secondary | ICD-10-CM | POA: Diagnosis not present

## 2019-10-14 DIAGNOSIS — I5022 Chronic systolic (congestive) heart failure: Secondary | ICD-10-CM | POA: Diagnosis present

## 2019-10-14 DIAGNOSIS — O9943 Diseases of the circulatory system complicating the puerperium: Secondary | ICD-10-CM | POA: Diagnosis not present

## 2019-10-14 DIAGNOSIS — Z8632 Personal history of gestational diabetes: Secondary | ICD-10-CM | POA: Diagnosis not present

## 2019-10-14 DIAGNOSIS — Z79899 Other long term (current) drug therapy: Secondary | ICD-10-CM | POA: Diagnosis not present

## 2019-10-14 LAB — BASIC METABOLIC PANEL
Anion gap: 9 (ref 5–15)
BUN: 18 mg/dL (ref 6–20)
CO2: 21 mmol/L — ABNORMAL LOW (ref 22–32)
Calcium: 9.6 mg/dL (ref 8.9–10.3)
Chloride: 107 mmol/L (ref 98–111)
Creatinine, Ser: 1.09 mg/dL — ABNORMAL HIGH (ref 0.44–1.00)
GFR calc Af Amer: >60 mL/min (ref >60)
GFR calc non Af Amer: >60 mL/min (ref >60)
Glucose, Bld: 88 mg/dL (ref 70–99)
Potassium: 4 mmol/L (ref 3.5–5.1)
Sodium: 137 mmol/L (ref 135–145)

## 2019-10-14 NOTE — Patient Instructions (Addendum)
Routine lab work today. Will notify you of abnormal results  Stop Lasix.  Restart Spironolactone 12.5mg    Restart Carvedilol 3.125mg  twice daily

## 2019-10-14 NOTE — Progress Notes (Signed)
Advanced Heart Failure Clinic Note   Referring Physician: PCP: Patient, No Pcp Per PCP-Cardiologist: Dr. Shirlee Latch  Reason for Visit: post hospital f/u for postpartum cardiomyopathy   HPI:  Robin Gallegos is a 28 year old female G1P1, now 8 weeks post partum. Pregnancy was c/b by preeclampsia and gestational diabetes. She presented to [facility] at [redacted] weeks pregnant w/ SOB and dizziness on 08/11/19 and was admitted for IOL for preeclampsia.Delivered and health girl.Post delivery course was uncomplicated. She was discharged home on nifedipinefor furtherBP management. Nifedipine ultimately discontinued at post partum f/u by OB.  She presented to the [facility] ED 10/20 w/ CC of dyspneaand palpitations. ECHO was completed and showed reduced EF at 20%. HIV negative. No FH of cardiomyopathy or premature coronary disease.  No baseline coronary risk factors and no h/o chest pain. Slight hs-TnI elevation (18=>21) consistent with CHF. Not c/w SCAD and no clear RWMAs on echo. TSH normal. 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 She ws diuresed w/ IV Lasix w/ symptomatic improvement and improvement in volume. Diuresed to euvolemic state and placed on guidelines medical therapy. Initially placed on Losartan then transitioned to [facility]. Also treated w/ spironolactone and digoxin. Placed on daily PO lasix 40 mg daily. Pt counsled on the need for contraception while on cardiac meds, particularly Entresto. Discharge wt 191 lb.   She presents to clinic for post hospital f/u. Here with her baby. Comfortable w/ ADLs but has mild dyspnea w/ moderate physical activity. Compliant w/ meds. Checking wt daily at home. Trying to limit salt intake. Office wt today is 195 lb (191 on d/c). She reports that she has experienced some occasional positional dizziness but no syncope/ near syncope. She has had some hypotension at home w/ SBPs as low as the upper 80s. She notes that her mother is a Associate Professor and advised that she stop her  spironolactone and coreg. She has continued to take Entresto bid and Lasix 40 mg daily. BP today, after taking her AM meds is 110/84. Pulse rate 100 bpm. Currently not sexually active and understands risk of fetal toxicity w/ HF meds. Plans to discuss contraception at next ob gyn visit.     Review of Systems: [y] = yes, [ ]  = no   General: Weight gain [ ] ; Weight loss [ ] ; Anorexia [ ] ; Fatigue [ ] ; Fever [ ] ; Chills [ ] ; Weakness [ ]   Cardiac: Chest pain/pressure [ ] ; Resting SOB [ ] ; Exertional SOB [ ] ; Orthopnea [ ] ; Pedal Edema [ ] ; Palpitations [ ] ; Syncope [ ] ; Presyncope [ ] ; Paroxysmal nocturnal dyspnea[ ]   Pulmonary: Cough [ ] ; Wheezing[ ] ; Hemoptysis[ ] ; Sputum [ ] ; Snoring [ ]   GI: Vomiting[ ] ; Dysphagia[ ] ; Melena[ ] ; Hematochezia [ ] ; Heartburn[ ] ; Abdominal pain [ ] ; Constipation [ ] ; Diarrhea [ ] ; BRBPR [ ]   GU: Hematuria[ ] ; Dysuria [ ] ; Nocturia[ ]   Vascular: Pain in legs with walking [ ] ; Pain in feet with lying flat [ ] ; Non-healing sores [ ] ; Stroke [ ] ; TIA [ ] ; Slurred speech [ ] ;  Neuro: Headaches[ ] ; Vertigo[ ] ; Seizures[ ] ; Paresthesias[ ] ;Blurred vision [ ] ; Diplopia [ ] ; Vision changes [ ]   Ortho/Skin: Arthritis [ ] ; Joint pain [ ] ; Muscle pain [ ] ; Joint swelling [ ] ; Back Pain [ ] ; Rash [ ]   Psych: Depression[ ] ; Anxiety[ ]   Heme: Bleeding problems [ ] ; Clotting disorders [ ] ; Anemia [ ]   Endocrine: Diabetes [ ] ; Thyroid dysfunction[ ]    Past Medical  History:  Diagnosis Date  . Gestational diabetes mellitus (GDM)   . Pre-eclampsia     Current Outpatient Medications  Medication Sig Dispense Refill  . carvedilol (COREG) 3.125 MG tablet Take 1 tablet (3.125 mg total) by mouth 2 (two) times daily with a meal. 60 tablet 6  . sacubitril-valsartan (ENTRESTO) 24-26 MG Take 1 tablet by mouth 2 (two) times daily. 60 tablet 11  . spironolactone (ALDACTONE) 25 MG tablet Take 1 tablet (25 mg total) by mouth daily. 30 tablet 6   No current facility-administered  medications for this encounter.     Allergies  Allergen Reactions  . Penicillins Hives    Did it involve swelling of the face/tongue/throat, SOB, or low BP? Unknown Did it involve sudden or severe rash/hives, skin peeling, or any reaction on the inside of your mouth or nose? Unknown Did you need to seek medical attention at a hospital or doctor's office? Unknown When did it last happen?pt was a child If all above answers are "NO", may proceed with cephalosporin use.       Social History   Socioeconomic History  . Marital status: Single    Spouse name: Not on file  . Number of children: Not on file  . Years of education: Not on file  . Highest education level: Not on file  Occupational History  . Not on file  Social Needs  . Financial resource strain: Not on file  . Food insecurity    Worry: Not on file    Inability: Not on file  . Transportation needs    Medical: Not on file    Non-medical: Not on file  Tobacco Use  . Smoking status: Never Smoker  . Smokeless tobacco: Never Used  Substance and Sexual Activity  . Alcohol use: Not Currently  . Drug use: Not Currently  . Sexual activity: Yes  Lifestyle  . Physical activity    Days per week: Not on file    Minutes per session: Not on file  . Stress: Not on file  Relationships  . Social Herbalist on phone: Not on file    Gets together: Not on file    Attends religious service: Not on file    Active member of club or organization: Not on file    Attends meetings of clubs or organizations: Not on file    Relationship status: Not on file  . Intimate partner violence    Fear of current or ex partner: Not on file    Emotionally abused: Not on file    Physically abused: Not on file    Forced sexual activity: Not on file  Other Topics Concern  . Not on file  Social History Narrative  . Not on file     No family history on file.  Vitals:   10/14/19 1015  BP: 110/84  Pulse: 100  SpO2: 99%   Weight: 88.8 kg (195 lb 12.8 oz)     PHYSICAL EXAM: General:  Well appearing, young AAF. No respiratory difficulty HEENT: normal Neck: supple. no JVD. Carotids 2+ bilat; no bruits. No lymphadenopathy or thyromegaly appreciated. Cor: PMI nondisplaced. Regular rhythm, tachy rate. No rubs, gallops or murmurs. Lungs: clear Abdomen: soft, nontender, nondistended. No hepatosplenomegaly. No bruits or masses. Good bowel sounds. Extremities: no cyanosis, clubbing, rash, edema Neuro: alert & oriented x 3, cranial nerves grossly intact. moves all 4 extremities w/o difficulty. Affect pleasant.  ECG: not performed    ASSESSMENT & PLAN:  1. 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 ECHO 10/20 w/ reduced EF 20% and diffuse hypokinesis.  RFs include African-American, gestational diabetes, and pre-eclampsia. She is HIV negative. No FH of cardiomyopathy or premature coronary disease. She has no baseline coronary risk factors and no h/o CP c/w ischemia. TSH normal. -NYHA Class II symptoms. Wt ok since hospital d/c (up 4 lb from hospital d/c wt but fully clothed). Appears euvolemic on exam.  -as noted above, she self discontinued both her spironolactone and coreg due to low BP at home and dizziness. Has been fully compliant w/ Entresto 24-26 bid and Lasix 40 mg daily. BP stable at 110/84 today and asymptomatic. We discussed guidelines directed medical therapy for systolic HF, in an effort to improve her EF. Given her volume status is stable, I think we can d/c scheduled daily PO lasix and only use PRN based on wt gain. She can take a dose of Lasix if > 3lb wt gain in 24 hrs or >5 lb wt gain in 1 week\ -Will continue low dose Entresto 24-26 bid -Will add back spironolactone, but at a lower dose of 12.5 mg daily -Add back low dose Coreg 3.125 mg bid given elevated HR  -She will continue to monitor BP closely at home and will call if recurrent symptomatic hypotension -Check BMP today -F/u again w/ APP in  7-10 days to reassess BP and response to therapy. I doubt she will be able to tolerate higher doses of Entresto. May need to switch to Losartan if unable to tolerate. -She is not currently sexually active and understands risk of fetal toxicity w/ HF meds. Plans to discuss contraception at next ob gyn visit.  -She will ultimately need repeat echo 3 months after optimization of HF regimen to reassess EF. If EF remains < 35%, will need referral to EP for consideration of ICD for primary prevention.   F/u in 7-10 days.    Robin Lis, PA-C 10/14/19

## 2019-10-17 ENCOUNTER — Encounter (HOSPITAL_COMMUNITY): Payer: 59

## 2019-10-17 ENCOUNTER — Other Ambulatory Visit
Admission: RE | Admit: 2019-10-17 | Discharge: 2019-10-17 | Disposition: A | Discharge status: Home / Self Care | Payer: Medicaid (Managed Care) | Source: Ambulatory Visit | Attending: Medical | Admitting: Medical

## 2019-10-17 DIAGNOSIS — Z113 Encounter for screening for infections with a predominantly sexual mode of transmission: Secondary | ICD-10-CM | POA: Insufficient documentation

## 2019-10-17 DIAGNOSIS — Z118 Encounter for screening for other infectious and parasitic diseases: Secondary | ICD-10-CM | POA: Insufficient documentation

## 2019-10-17 DIAGNOSIS — Z7251 High risk heterosexual behavior: Secondary | ICD-10-CM | POA: Insufficient documentation

## 2019-10-18 LAB — N. GONORRHOEAE DNA AMPLIFICATION: N. gonorrhoeae DNA Amplification: 0

## 2019-10-18 LAB — CHLAMYDIA PLASMID DNA AMPLIFICATION: Chlamydia Plasmid DNA Amplification: 0

## 2019-10-18 LAB — TRICHOMONAS DNA AMPLIFICATION: Trichomonas DNA amplification: 0

## 2019-10-24 ENCOUNTER — Other Ambulatory Visit: Payer: Self-pay

## 2019-10-24 ENCOUNTER — Ambulatory Visit (HOSPITAL_COMMUNITY)
Admission: RE | Admit: 2019-10-24 | Discharge: 2019-10-24 | Disposition: A | Discharge status: Home / Self Care | Payer: 59 | Source: Ambulatory Visit | Attending: Cardiology | Admitting: Cardiology

## 2019-10-24 VITALS — BP 114/78 | HR 89 | Wt 194.6 lb

## 2019-10-24 DIAGNOSIS — I5022 Chronic systolic (congestive) heart failure: Secondary | ICD-10-CM | POA: Insufficient documentation

## 2019-10-24 LAB — BASIC METABOLIC PANEL
Anion gap: 10 (ref 5–15)
BUN: 13 mg/dL (ref 6–20)
CO2: 19 mmol/L — ABNORMAL LOW (ref 22–32)
Calcium: 9.4 mg/dL (ref 8.9–10.3)
Chloride: 109 mmol/L (ref 98–111)
Creatinine, Ser: 1.05 mg/dL — ABNORMAL HIGH (ref 0.44–1.00)
GFR calc Af Amer: >60 mL/min (ref >60)
GFR calc non Af Amer: >60 mL/min (ref >60)
Glucose, Bld: 86 mg/dL (ref 70–99)
Potassium: 4.4 mmol/L (ref 3.5–5.1)
Sodium: 138 mmol/L (ref 135–145)

## 2019-10-24 NOTE — Patient Instructions (Signed)
It was a pleasure seeing you today!  MEDICATIONS: -We are changing your medications today -Increase spironolactone to 25 mg (1 tablet) daily). Go back down to 1/2 tablet daily if you have increased dizziness.  -Call if you have questions about your medications.  LABS: -We will call you if your labs need attention.  NEXT APPOINTMENT: Return to clinic in 2 weeks with Pharmacy Clinic.  In general, to take care of your heart failure: -Limit your fluid intake to 2 Liters (half-gallon) per day.   -Limit your salt intake to ideally 2-3 grams (2000-3000 mg) per day. -Weigh yourself daily and record, and bring that "weight diary" to your next appointment.  (Weight gain of 2-3 pounds in 1 day typically means fluid weight.) -The medications for your heart are to help your heart and help you live longer.   -Please contact us before stopping any of your heart medications.  Call the clinic at [phone] with questions or to reschedule future appointments.

## 2019-10-27 NOTE — Progress Notes (Signed)
Referring Physician: PCP: Patient, No Pcp Per PCP-Cardiologist: Dr. Aundra Dubin  HPI:  Robin Gallegos is a 28year old female G1P1, now 8 weeks post partum. Pregnancy was c/b by preeclampsia and gestational diabetes. She presented to [facility] at [redacted] weeks pregnant w/ SOB and dizziness on 08/11/19 and was admitted for IOL for preeclampsia.Delivered a healthy girl.Post delivery course was uncomplicated. She was discharged home on nifedipinefor furtherBP management. Nifedipine ultimately discontinued at post partum f/u by OB.  She presented to the [facility] ED 10/20 w/ CC of dyspneaand palpitations.ECHO was completed and showed reduced EF at 20%. HIV negative. No FH of cardiomyopathy or premature coronary disease.  No baseline coronary risk factors and no h/o chest pain. Slight hs-TnI elevation (18=>21) consistent with CHF. Not c/w SCAD and no clear RWMAs on echo. TSH normal. 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 She ws diuresed w/ IV Lasix w/ symptomatic improvement and improvement in volume. Diuresed to euvolemic state and placed on guideline directed medical therapy. Initially placed on Losartan then transitioned to [facility]. Also treated w/ spironolactone and digoxin. Placed on daily PO lasix 40 mg daily. Pt counsled on the need for contraception while on cardiac meds, particularly Entresto. Discharge wt 191 lb.   She recently presented to HF Clinic for follow up with Robin Gallegos. At that visit she reported being comfortable w/ ADLs but had mild dyspnea with moderate physical activity. She was compliant with all medications. Checks weight daily at home. Tries to limit salt intake. Office weight was 195 lb (191 on d/c). She reported that she had experienced some occasional positional dizziness but no syncope/ near syncope. She has had some hypotension at home w/ SBPs as low as the upper 80s. She noted that her mother is a Occupational psychologist and advised that she stop her spironolactone and carvedilol. She had continued  to take Entresto BID and Lasix 40 mg daily. BP today, after taking her AM meds is 110/84. Pulse rate 100 bpm. Currently not sexually active and understands risk of fetal toxicity w/ HF meds. Plans to discuss contraception at next ob gyn visit.   Today she returns to HF clinic for pharmacist medication titration. At last visit, carvedilol 3.125 mg BID and spironolactone 12.5 mg daily were restarted. Overall she is doing well with those changes. Has occasional dizziness after taking evening carvedilol dose, but this is not bothersome. Her blood pressures have ranged from 95-110/60-75. No chest pain, palpitations or syncope. She reports that her breathing is much better and she feels less winded. She does not weigh herself daily but feels that her weight has been stable. No LEE, PND or orthopnea. Her appetite has been good. She does not follow a low-salt diet.        . Shortness of breath/dyspnea on exertion? no  . Orthopnea/PND? no . Edema? no . Lightheadedness/dizziness? no . Daily weights at home? no . Blood pressure/heart rate monitoring at home? yes . Following low-sodium/fluid-restricted diet? no  HF Medications: Carvedilol 3.125 mg BID Entresto 24/26 mg BID Spironolactone 12.5 mg daily   Has the patient been experiencing any side effects to the medications prescribed?  occasional dizziness with evening dose of carvedilol.   Does the patient have any problems obtaining medications due to transportation or finances?   No - has Pharmacist, community  Understanding of regimen: good Understanding of indications: good Potential of compliance: good Patient understands to avoid NSAIDs. Patient understands to avoid decongestants.    Pertinent Lab Values: . Serum creatinine 1.05, BUN 13,  Potassium 4.4, Sodium 138  Vital Signs: . Weight: 194.6 lbs (last clinic weight: 195.8 lbs) . Blood pressure: 114/78  . Heart rate: 89   Assessment: 1. 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 ECHO  10/20 w/ reduced EF 20% and diffuse hypokinesis.RFs include African-American, gestational diabetes, and pre-eclampsia. She is HIV negative. No FH of cardiomyopathy or premature coronary disease. She has no baseline coronary risk factors and no h/o CP c/w ischemia.TSH normal. -NYHA Class II symptoms.  Appears euvolemic on exam.  -Continue Entresto 24-26 mg BID -Increase spironolactone to 25 mg daily. Repeat BMET in 10-14 days.  -Continue carvedilol 3.125 mg BID  -BP currently limiting up-titration of most medications.  -She will continue to monitor BP closely at home and will call if recurrent symptomatic hypotension -She is not currently sexually active and understands risk of fetal toxicity w/ HF meds. Plans to discuss contraception at next ob gyn visit. She is not breastfeeding.  -She will ultimately need repeat echo 3 months after optimization of HF regimen to reassess EF (12/2019). If EF remains < 35%, will need referral to EP for consideration of ICD for primary prevention.    Plan: 1) Medication changes: Based on clinical presentation, vital signs and recent labs will increase spironolactone to 25 mg daily. 2) Labs: Scr 1.05, K 4.4 3) Follow-up: 2 weeks in Pharmacy Clinic. Will also repeat BMET.  Karle Plumber, PharmD, BCPS, CPP Heart Failure Clinic Pharmacist [phone]  Discussed patient with Dr. Marden Noble.  Agree with assessment and plan.  Continue to slowly titrate GDMT while encouraging compliance.    Leota Sauers Pharm.D. CPP, BCPS Clinical Pharmacist [phone] 10/27/2019 11:21 AM

## 2019-11-03 MED FILL — ENTRESTO 24 MG-26 MG TABLET: 24-26 | 30 days supply | Qty: 60 | Fill #0

## 2019-11-04 NOTE — Progress Notes (Signed)
Referring Physician: PCP: Patient, No Pcp Per PCP-Cardiologist: Dr. Shirlee Latch  HPI:  Robin Gallegos is a 28year old female G1P1, now 8 weeks post partum. Pregnancy was c/b by preeclampsia and gestational diabetes. She presented to [facility] at [redacted] weeks pregnant w/ SOB and dizziness on 08/11/19 and was admitted for IOL for preeclampsia.Delivered a healthy girl.Post delivery course was uncomplicated. She was discharged home on nifedipinefor furtherBP management. Nifedipine ultimately discontinued at post partum f/u by OB.  She presented to the [facility] ED 10/20 w/ CC of dyspneaand palpitations.ECHO was completed and showed reduced EF at 20%. HIV negative. No FH of cardiomyopathy or premature coronary disease.  No baseline coronary risk factors and no h/o chest pain. Slight hs-TnI elevation (18=>21) consistent with CHF. Not c/w SCAD and no clear RWMAs on echo. TSH normal. 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 She ws diuresed w/ IV Lasix w/ symptomatic improvement and improvement in volume. Diuresed to euvolemic state and placed on guideline directed medical therapy. Initially placed on Losartan then transitioned to [facility]. Also treated w/ spironolactone and digoxin. Placed on daily PO lasix 40 mg daily. Pt counsled on the need for contraception while on cardiac meds, particularly Entresto. Discharge wt 191 lb.   She recently presented to HF Clinic for follow up with Pharmacy clinic. She reported doing well with restarting carvedilol 3.125 mg BID and spironolactone 12.5 mg daily. Had occasional dizziness after taking evening carvedilol dose, but this was not bothersome. Her blood pressures ranged from 95-110/60-75. No chest pain, palpitations or syncope. She reported that her breathing was much better and she felt less winded. She does not weigh herself daily but felt that her weight had been stable. No LEE, PND or orthopnea. Her appetite had been good. She was not following a low-salt diet.   Today she returns to  HF clinic for pharmacist medication titration. At last pharmacy clinic visit, spironolactone was increased to 25 mg daily. Overall she is feeling well today. Did note feeling dizzy this morning, but was able to tolerate her medications. She has started to take all her medications with food and that has improved tolerability. Her breathing has been good and she does not complain of SOB/DOE. Her activity level has increased since starting work recently at [employer]. She has been weighing herself daily and her weight has been stable at 189-194 lbs. She does not take any diuretic. No LEE, PND or orthopnea. Her appetite is good and she has been following a low salt diet.     . Shortness of breath/dyspnea on exertion? no  . Orthopnea/PND? no . Edema? no . Lightheadedness/dizziness? no . Daily weights at home? no . Blood pressure/heart rate monitoring at home? yes . Following low-sodium/fluid-restricted diet? yes  HF Medications: Carvedilol 3.125 mg BID Entresto 24/26 mg BID Spironolactone 25 mg daily   Has the patient been experiencing any side effects to the medications prescribed?  occasional dizziness with evening dose of carvedilol. Has improved tolerability since starting to take medications with food.   Does the patient have any problems obtaining medications due to transportation or finances?   No - has Nurse, learning disability  Understanding of regimen: good Understanding of indications: good Potential of compliance: good Patient understands to avoid NSAIDs. Patient understands to avoid decongestants.    Pertinent Lab Values (10/24/19): Marland Kitchen Serum creatinine 1.05, BUN 13, Potassium 4.4, Sodium 138  Vital Signs: . Weight: 194.0 lbs (last clinic weight: 194.6 lbs) . Blood pressure: 115/78 . Heart rate: 89   Assessment:  1. 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 ECHO 10/20 w/ reduced EF 20% and diffuse hypokinesis.RFs include African-American, gestational diabetes, and  pre-eclampsia. She is HIV negative. No FH of cardiomyopathy or premature coronary disease. She has no baseline coronary risk factors and no h/o CP c/w ischemia.TSH normal. -NYHA Class II symptoms.  Appears euvolemic on exam.  - Vitals: BP 115/78, HR 89 -Continue Entresto 24-26 mg BID -Continue spironolactone 25 mg daily.  -Continue carvedilol 3.125 mg BID  -Start Farxiga 10 mg daily. Repeat BMET at next clinic visit. Counseled on risk of genital mycotic infections, hypotension and hypovolemia.  -BP currently limiting up-titration of most medications. However, I think she will tolerate the [place]. She will continue to monitor BP closely at home and will call if recurrent symptomatic hypotension -She is not currently sexually active and understands risk of fetal toxicity w/ HF meds. Plans to discuss contraception at next ob gyn visit. She is not breastfeeding.  -She will ultimately need repeat echo 3 months after optimization of HF regimen to reassess EF (12/2019). If EF remains < 35%, will need referral to EP for consideration of ICD for primary prevention.    Plan: 1) Medication changes: Based on clinical presentation, vital signs and recent labs will start Farxiga 10 mg daily 2) Labs: stable Scr 1.05, K 4.4. Repeat BMET at next visit.  3) Follow-up: 4 weeks with Dr. Rush Farmer, PharmD, BCPS, [facility], CPP Heart Failure Clinic Pharmacist [phone]

## 2019-11-09 ENCOUNTER — Encounter (HOSPITAL_COMMUNITY): Payer: 59

## 2019-11-09 ENCOUNTER — Other Ambulatory Visit: Payer: Self-pay

## 2019-11-09 ENCOUNTER — Ambulatory Visit (HOSPITAL_COMMUNITY)
Admission: RE | Admit: 2019-11-09 | Discharge: 2019-11-09 | Disposition: A | Discharge status: Home / Self Care | Payer: 59 | Source: Ambulatory Visit | Attending: Internal Medicine | Admitting: Internal Medicine

## 2019-11-09 ENCOUNTER — Encounter (HOSPITAL_COMMUNITY): Payer: Self-pay

## 2019-11-09 ENCOUNTER — Telehealth (HOSPITAL_COMMUNITY): Payer: Self-pay

## 2019-11-09 ENCOUNTER — Telehealth (HOSPITAL_COMMUNITY): Payer: Self-pay | Admitting: Pharmacy Technician

## 2019-11-09 VITALS — BP 115/78 | HR 89 | Wt 194.0 lb

## 2019-11-09 DIAGNOSIS — Z7901 Long term (current) use of anticoagulants: Secondary | ICD-10-CM | POA: Insufficient documentation

## 2019-11-09 DIAGNOSIS — Z79899 Other long term (current) drug therapy: Secondary | ICD-10-CM | POA: Diagnosis not present

## 2019-11-09 DIAGNOSIS — I5022 Chronic systolic (congestive) heart failure: Secondary | ICD-10-CM | POA: Insufficient documentation

## 2019-11-09 DIAGNOSIS — R42 Dizziness and giddiness: Secondary | ICD-10-CM | POA: Insufficient documentation

## 2019-11-09 MED ORDER — DAPAGLIFLOZIN PROPANEDIOL 10 MG PO TABS: 10.0000 mg | ORAL_TABLET | Freq: Every day | ORAL | 11 refills | Status: DC

## 2019-11-09 NOTE — Telephone Encounter (Signed)
Advanced Heart Failure Patient Advocate Encounter  Prior Authorization for Wilder Glade has been approved.     Effective dates: 11/09/2019 through 11/08/2022  Patients co-pay is $129.08   Pharmacy Billing Information  BIN: [id number]  PCN: [id number]  Group: [id number]  ID: [id number]  Called and spoke with [facility] who stated an E-voucher was automatically applied, leaving the patient with a $21.31 co-pay. They agreed to call and have the E-voucher removed so that they can use the co-pay card I provided instead. The co-pay card information above covers up to $150 per 30-days. This should leave the patient with a $0 co-pay. Pharmacist stated they would call the patient when they have done that, as it could take a little time to do.  Charlann Boxer, CPhT

## 2019-11-09 NOTE — Patient Instructions (Signed)
It was a pleasure seeing you today!  MEDICATIONS: -We are changing your medications today -Start Farxiga 10 mg (1 tablet) daily -Call if you have questions about your medications.  NEXT APPOINTMENT: Return to clinic in ~4 weeks with Dr. Aundra Dubin - we will work on scheduling this.  In general, to take care of your heart failure: -Limit your fluid intake to 2 Liters (half-gallon) per day.   -Limit your salt intake to ideally 2-3 grams (2000-3000 mg) per day. -Weigh yourself daily and record, and bring that "weight diary" to your next appointment.  (Weight gain of 2-3 pounds in 1 day typically means fluid weight.) -The medications for your heart are to help your heart and help you live longer.   -Please contact us before stopping any of your heart medications.  Call the clinic at [phone] with questions or to reschedule future appointments.

## 2019-11-09 NOTE — Telephone Encounter (Signed)
Called pt to set her up for virtual cardiac rehab but pt did not answer canceled pt appt and left a message for pt to call back. Mailed letter.

## 2019-11-09 NOTE — Telephone Encounter (Signed)
Patient Advocate Encounter   Received notification from [place] that prior authorization for Wilder Glade is required.   PA submitted on CoverMyMeds Key  [facility] Status is pending   Will continue to follow.

## 2019-11-18 ENCOUNTER — Other Ambulatory Visit: Payer: Self-pay

## 2019-11-18 MED ORDER — LOPERAMIDE HCL 2 MG PO CAPS *I*
ORAL_CAPSULE | ORAL | 0 refills | Status: DC
Start: 2019-11-17 — End: 2023-12-23
  Filled 2019-11-18: qty 9, 2d supply, fill #0

## 2019-11-21 ENCOUNTER — Other Ambulatory Visit: Payer: Self-pay

## 2019-11-23 ENCOUNTER — Other Ambulatory Visit: Payer: Self-pay

## 2019-11-23 MED ORDER — FLUCONAZOLE 150 MG PO TABS *I*
150.0000 mg | ORAL_TABLET | Freq: Once | ORAL | 0 refills | Status: DC
Start: 2019-11-23 — End: 2020-01-30
  Filled 2019-11-23: qty 1, 1d supply, fill #0

## 2019-11-23 MED ORDER — SULFAMETHOXAZOLE-TRIMETHOPRIM 800-160 MG PO TABS *I*
1.0000 | ORAL_TABLET | Freq: Two times a day (BID) | ORAL | 0 refills | Status: DC
Start: 2019-11-23 — End: 2023-12-23
  Filled 2019-11-23: qty 6, 3d supply, fill #0

## 2019-11-23 MED ORDER — METRONIDAZOLE 500 MG PO TABS *I*
500.0000 mg | ORAL_TABLET | Freq: Two times a day (BID) | ORAL | 0 refills | Status: DC
Start: 2019-11-23 — End: 2020-01-30
  Filled 2019-11-23: qty 14, 7d supply, fill #0

## 2019-11-23 NOTE — Telephone Encounter (Signed)
No response from pt regarding CR.  Closed referral.  

## 2019-11-25 ENCOUNTER — Other Ambulatory Visit: Payer: Self-pay

## 2019-12-13 ENCOUNTER — Other Ambulatory Visit: Payer: Self-pay

## 2019-12-13 MED ORDER — NITROFURANTOIN MONOHYD MACRO 100 MG PO CAPS *I*
100.0000 mg | ORAL_CAPSULE | Freq: Two times a day (BID) | ORAL | 0 refills | Status: DC
Start: 2019-12-13 — End: 2022-01-30
  Filled 2019-12-13: qty 10, 5d supply, fill #0

## 2019-12-16 HISTORY — PX: COSMETIC SURGERY: SHX468

## 2019-12-19 ENCOUNTER — Encounter (HOSPITAL_COMMUNITY): Payer: 59 | Admitting: Cardiology

## 2020-01-09 ENCOUNTER — Other Ambulatory Visit: Payer: Self-pay

## 2020-01-20 ENCOUNTER — Other Ambulatory Visit: Payer: Self-pay

## 2020-01-30 ENCOUNTER — Other Ambulatory Visit: Payer: Self-pay

## 2020-01-30 ENCOUNTER — Other Ambulatory Visit
Admission: RE | Admit: 2020-01-30 | Discharge: 2020-01-30 | Disposition: A | Discharge status: Home / Self Care | Payer: Medicaid (Managed Care) | Source: Ambulatory Visit | Attending: Medical | Admitting: Medical

## 2020-01-30 DIAGNOSIS — Z113 Encounter for screening for infections with a predominantly sexual mode of transmission: Secondary | ICD-10-CM | POA: Insufficient documentation

## 2020-01-30 DIAGNOSIS — Z118 Encounter for screening for other infectious and parasitic diseases: Secondary | ICD-10-CM | POA: Insufficient documentation

## 2020-01-30 DIAGNOSIS — Z7251 High risk heterosexual behavior: Secondary | ICD-10-CM | POA: Insufficient documentation

## 2020-01-30 MED ORDER — METRONIDAZOLE 500 MG PO TABS *I*
500.0000 mg | ORAL_TABLET | Freq: Two times a day (BID) | ORAL | 0 refills | Status: AC
Start: 2020-01-30 — End: 2020-02-06
  Filled 2020-01-30: qty 14, 7d supply, fill #0

## 2020-01-30 MED ORDER — FLUCONAZOLE 150 MG PO TABS *I*
150.0000 mg | ORAL_TABLET | ORAL | 0 refills | Status: DC
Start: 2020-01-30 — End: 2020-02-27
  Filled 2020-01-30: qty 1, 1d supply, fill #0
  Filled 2020-02-21: qty 1, 1d supply, fill #1

## 2020-02-01 LAB — CHLAMYDIA PLASMID DNA AMPLIFICATION: Chlamydia Plasmid DNA Amplification: 0

## 2020-02-01 LAB — TRICHOMONAS DNA AMPLIFICATION: Trichomonas DNA amplification: 0

## 2020-02-01 LAB — N. GONORRHOEAE DNA AMPLIFICATION: N. gonorrhoeae DNA Amplification: 0

## 2020-02-15 ENCOUNTER — Other Ambulatory Visit: Payer: Self-pay

## 2020-02-21 ENCOUNTER — Emergency Department (HOSPITAL_COMMUNITY)
Admission: EM | Admit: 2020-02-21 | Discharge: 2020-02-21 | Disposition: A | Discharge status: Home / Self Care | Payer: 59 | Attending: Emergency Medicine | Admitting: Emergency Medicine

## 2020-02-21 ENCOUNTER — Encounter (HOSPITAL_COMMUNITY): Payer: Self-pay | Admitting: Emergency Medicine

## 2020-02-21 ENCOUNTER — Other Ambulatory Visit: Payer: Self-pay

## 2020-02-21 DIAGNOSIS — Z79899 Other long term (current) drug therapy: Secondary | ICD-10-CM | POA: Diagnosis not present

## 2020-02-21 DIAGNOSIS — R509 Fever, unspecified: Secondary | ICD-10-CM | POA: Insufficient documentation

## 2020-02-21 DIAGNOSIS — R070 Pain in throat: Secondary | ICD-10-CM | POA: Diagnosis present

## 2020-02-21 DIAGNOSIS — J020 Streptococcal pharyngitis: Secondary | ICD-10-CM

## 2020-02-21 LAB — POC URINE PREG, ED: Preg Test, Ur: NEGATIVE

## 2020-02-21 LAB — GROUP A STREP BY PCR: Group A Strep by PCR: DETECTED — AB

## 2020-02-21 MED ORDER — KETOROLAC TROMETHAMINE 30 MG/ML IJ SOLN
30.0000 mg | Freq: Once | INTRAMUSCULAR | Status: AC
  Administered 2020-02-21: 30 mg via INTRAVENOUS
  Filled 2020-02-21: qty 1

## 2020-02-21 MED ORDER — NAPROXEN 500 MG PO TABS: 500.0000 mg | ORAL_TABLET | Freq: Two times a day (BID) | ORAL | 0 refills | Status: DC | PRN

## 2020-02-21 MED ORDER — SODIUM CHLORIDE 0.9 % IV BOLUS
1000.0000 mL | Freq: Once | INTRAVENOUS | Status: AC
  Administered 2020-02-21: 1000 mL via INTRAVENOUS

## 2020-02-21 MED ORDER — CLINDAMYCIN HCL 150 MG PO CAPS: 450.0000 mg | ORAL_CAPSULE | Freq: Three times a day (TID) | ORAL | 0 refills | Status: DC

## 2020-02-21 MED FILL — CLINDAMYCIN HCL 150 MG CAP: 150 | 10 days supply | Qty: 90 | Fill #0

## 2020-02-21 MED FILL — NAPROXEN 500 MG TABLET: 500 | 15 days supply | Qty: 30 | Fill #0

## 2020-02-21 NOTE — Discharge Instructions (Addendum)
Please take your clindamycin and naproxen, as prescribed.  Discontinue naproxen should you become pregnant or begin breast-feeding.  I also highly encourage you to consume warm tea with honey and use Chloraseptic spray, if needed.  Please increase your oral hydration and eat regular meals.  Return to the ED or seek immediate medical attention should you experience any new or worsening symptoms.  Otherwise, please follow-up with your primary care provider for ongoing evaluation and management.

## 2020-02-21 NOTE — ED Triage Notes (Signed)
Pt reports sore throat since Sunday night but has worsened. Painful to swallow.

## 2020-02-21 NOTE — ED Notes (Signed)
Pt discharge instructions and prescriptions reviewed with the patient. The patient verbalized understanding of both. Pt discharged. 

## 2020-02-21 NOTE — ED Provider Notes (Signed)
[facility] EMERGENCY DEPARTMENT Provider Note   CSN: [id number] Arrival date & time: 02/21/20  1026     History Chief Complaint  Patient presents with  . Sore Throat    Robin Gallegos is a 29 y.o. female with no relevant PMH presents to the ED for a 3-day history of sore throat.  Patient reports that she first noticed her symptoms on Sunday and they were accompanied by chills and intermitted fever.  She proceeded to go to [facility] yesterday and was tested for COVID-19.  She reports that results are pending, but should be available to her in the next 24 hours.  She states that she has not been able to eat or drink anything significant since symptom onset due to her pain.  She denies any headache or dizziness, pain with mastication, inability to open her mouth, drooling, significant voice change, trouble swallowing, wheezing, shortness of breath, chest pain, or other symptoms.  She admits that she is dehydrated and has only been taking TheraFlu for her symptoms.  Last menses approximately 4 weeks ago.  When asked, patient does endorse history of snoring and her tonsillar hypertrophy may be chronic.    HPI     Past Medical History:  Diagnosis Date  . Gestational diabetes mellitus (GDM)   . Pre-eclampsia     Patient Active Problem List   Diagnosis Date Noted  . CHF (congestive heart failure) (HCC) 10/04/2019  . SVD (spontaneous vaginal delivery) 08/13/2019  . Preeclampsia, third trimester 08/11/2019    History reviewed. No pertinent surgical history.   OB History    Gravida  1   Para  1   Term  1   Preterm  0   AB  0   Living  1     SAB  0   TAB  0   Ectopic  0   Multiple  0   Live Births  1           No family history on file.  Social History   Tobacco Use  . Smoking status: Never Smoker  . Smokeless tobacco: Never Used  Substance Use Topics  . Alcohol use: Not Currently  . Drug use: Not Currently    Home Medications Prior to  Admission medications   Medication Sig Start Date End Date Taking? Authorizing Provider  carvedilol (COREG) 3.125 MG tablet Take 1 tablet (3.125 mg total) by mouth 2 (two) times daily with a meal. 10/08/19   Clegg, Amy D, NP  clindamycin (CLEOCIN) 150 MG capsule Take 3 capsules (450 mg total) by mouth 3 (three) times daily for 10 days. 02/21/20 03/02/20  Lorelee New, PA-C  dapagliflozin propanediol (FARXIGA) 10 MG TABS tablet Take 10 mg by mouth daily before breakfast. 11/09/19   Laurey Morale, MD  naproxen (NAPROSYN) 500 MG tablet Take 1 tablet (500 mg total) by mouth 2 (two) times daily between meals as needed for moderate pain. 02/21/20   Lorelee New, PA-C  sacubitril-valsartan (ENTRESTO) 24-26 MG Take 1 tablet by mouth 2 (two) times daily. 10/07/19   Clegg, Amy D, NP  spironolactone (ALDACTONE) 25 MG tablet Take 1 tablet (25 mg total) by mouth daily. 10/08/19   Tonye Becket D, NP    Allergies    Penicillins  Review of Systems   Review of Systems  Constitutional: Negative for chills and fever.  HENT: Positive for sore throat. Negative for congestion, drooling, facial swelling and trouble swallowing.   Respiratory: Negative for  shortness of breath and wheezing.   Cardiovascular: Negative for chest pain.  Gastrointestinal: Negative for abdominal pain, nausea and vomiting.    Physical Exam Updated Vital Signs BP 113/69 (BP Location: Right Arm)   Pulse (!) 102   Temp 98.1 F (36.7 C) (Oral)   Resp 16   Ht 5\' 3"  (1.6 m)   Wt 83.9 kg   SpO2 100%   BMI 32.77 kg/m   Physical Exam Vitals and nursing note reviewed. Exam conducted with a chaperone present.  Constitutional:      General: She is not in acute distress.    Appearance: Normal appearance. She is not ill-appearing.  HENT:     Head: Normocephalic and atraumatic.     Mouth/Throat:     Comments: Oropharynx is patent, erythematous.  Tonsillar hypertrophy bilaterally.  No obvious exudates noted.  No uvular deviation.   Tolerating secretions well.  No trismus.  No, swelling.  Floor of mouth nonindurated.  No hot potato voice. Eyes:     General: No scleral icterus.    Conjunctiva/sclera: Conjunctivae normal.  Neck:     Comments: Mild bilateral lymphadenopathy.  No swelling.  ROM fully intact.  No significant tenderness rigidity.  No meningismus. Cardiovascular:     Rate and Rhythm: Regular rhythm. Tachycardia present.     Pulses: Normal pulses.     Heart sounds: Normal heart sounds.  Pulmonary:     Effort: Pulmonary effort is normal. No respiratory distress.     Breath sounds: Normal breath sounds. No wheezing.  Skin:    General: Skin is dry.     Capillary Refill: Capillary refill takes less than 2 seconds.  Neurological:     Mental Status: She is alert and oriented to person, place, and time.     GCS: GCS eye subscore is 4. GCS verbal subscore is 5. GCS motor subscore is 6.  Psychiatric:        Mood and Affect: Mood normal.        Behavior: Behavior normal.        Thought Content: Thought content normal.      ED Results / Procedures / Treatments   Labs (all labs ordered are listed, but only abnormal results are displayed) Labs Reviewed  GROUP A STREP BY PCR - Abnormal; Notable for the following components:      Result Value   Group A Strep by PCR DETECTED (*)    All other components within normal limits  POC URINE PREG, ED    EKG None  Radiology No results found.  Procedures Procedures (including critical care time)  Medications Ordered in ED Medications  sodium chloride 0.9 % bolus 1,000 mL (1,000 mLs Intravenous New Bag/Given 02/21/20 1309)  ketorolac (TORADOL) 30 MG/ML injection 30 mg (30 mg Intravenous Given 02/21/20 1341)    ED Course  I have reviewed the triage vital signs and the nursing notes.  Pertinent labs & imaging results that were available during my care of the patient were reviewed by me and considered in my medical decision making (see chart for details).      MDM Rules/Calculators/A&P                      Patient just had COVID-19 testing obtained yesterday and is declining repeat testing here today.  Will obtain group A strep by PCR testing.  She admits that she has not been adequately hydrated due to her sore throat symptoms which I suspect is contributing  to her tachycardia here in the ED.  We will obtain point-of-care urine pregnancy before treating her sore throat symptoms with IV Toradol and 1 L normal saline as her PCR testing pends.  Patient is not breastfeeding.  Group A strep PCR testing was positive.  Patient has an allergy to penicillins and she reports that that allergy was hives.  However, has been a very long time since she had this reaction.  I encouraged her to follow-up with allergist, but in the meantime I will prescribe clindamycin.  Her tachycardia improved with IV fluids.  Will prescribe naproxen to take regularly.  Also encouraging warm tea with honey and Chloraseptic spray if needed.  Emphasized the important of increased hydration and regular meals.  Strict ED return precautions discussed.  All of the evaluation and work-up results were discussed with the patient and any family at bedside. They were provided opportunity to ask any additional questions and have none at this time. They have expressed understanding of verbal discharge instructions as well as return precautions and are agreeable to the plan.    Final Clinical Impression(s) / ED Diagnoses Final diagnoses:  Strep pharyngitis    Rx / DC Orders ED Discharge Orders         Ordered    naproxen (NAPROSYN) 500 MG tablet  2 times daily between meals PRN     02/21/20 1415    clindamycin (CLEOCIN) 150 MG capsule  3 times daily     02/21/20 1420           Corena Herter, PA-C 02/21/20 1421    Lajean Saver, MD 02/23/20 [phone]

## 2020-02-22 ENCOUNTER — Other Ambulatory Visit: Payer: Self-pay

## 2020-02-27 ENCOUNTER — Other Ambulatory Visit: Payer: Self-pay

## 2020-02-27 MED ORDER — METRONIDAZOLE 0.75 % VA GEL *I*
VAGINAL | 0 refills | Status: DC
Start: 2020-02-27 — End: 2022-03-05
  Filled 2020-02-27: qty 70, 5d supply, fill #0

## 2020-02-27 MED ORDER — FLUCONAZOLE 150 MG PO TABS *I*
150.0000 mg | ORAL_TABLET | ORAL | 0 refills | Status: DC
Start: 2020-02-27 — End: 2020-04-20
  Filled 2020-02-27: qty 2, 3d supply, fill #0

## 2020-02-28 ENCOUNTER — Other Ambulatory Visit: Payer: Self-pay

## 2020-02-28 ENCOUNTER — Encounter (HOSPITAL_COMMUNITY): Payer: Self-pay | Admitting: Emergency Medicine

## 2020-02-28 ENCOUNTER — Emergency Department (HOSPITAL_COMMUNITY)
Admission: EM | Admit: 2020-02-28 | Discharge: 2020-02-29 | Disposition: A | Discharge status: Home / Self Care | Payer: 59 | Attending: Emergency Medicine | Admitting: Emergency Medicine

## 2020-02-28 DIAGNOSIS — Z5321 Procedure and treatment not carried out due to patient leaving prior to being seen by health care provider: Secondary | ICD-10-CM | POA: Insufficient documentation

## 2020-02-28 DIAGNOSIS — R21 Rash and other nonspecific skin eruption: Secondary | ICD-10-CM | POA: Insufficient documentation

## 2020-02-28 NOTE — ED Triage Notes (Signed)
Per pt, was diagnosed w/ strep throat, has been on antibiotics for 7 days, was itchy yesterday but today has a rash over a majority of her body.  No respiratory distress noted. Has been taking benadryl but no relief.

## 2020-02-29 ENCOUNTER — Ambulatory Visit (INDEPENDENT_AMBULATORY_CARE_PROVIDER_SITE_OTHER): Payer: 59 | Admitting: Nurse Practitioner

## 2020-02-29 ENCOUNTER — Other Ambulatory Visit: Payer: Self-pay

## 2020-02-29 VITALS — BP 128/88 | HR 95 | Temp 98.4°F | Resp 18 | Ht 64.0 in | Wt 195.6 lb

## 2020-02-29 DIAGNOSIS — Z8632 Personal history of gestational diabetes: Secondary | ICD-10-CM | POA: Diagnosis not present

## 2020-02-29 DIAGNOSIS — E1169 Type 2 diabetes mellitus with other specified complication: Secondary | ICD-10-CM | POA: Diagnosis not present

## 2020-02-29 DIAGNOSIS — L233 Allergic contact dermatitis due to drugs in contact with skin: Secondary | ICD-10-CM | POA: Diagnosis not present

## 2020-02-29 DIAGNOSIS — Z0000 Encounter for general adult medical examination without abnormal findings: Secondary | ICD-10-CM

## 2020-02-29 DIAGNOSIS — Z13220 Encounter for screening for lipoid disorders: Secondary | ICD-10-CM | POA: Diagnosis not present

## 2020-02-29 DIAGNOSIS — E669 Obesity, unspecified: Secondary | ICD-10-CM | POA: Diagnosis not present

## 2020-02-29 DIAGNOSIS — I5022 Chronic systolic (congestive) heart failure: Secondary | ICD-10-CM

## 2020-02-29 DIAGNOSIS — Z13228 Encounter for screening for other metabolic disorders: Secondary | ICD-10-CM | POA: Diagnosis not present

## 2020-02-29 DIAGNOSIS — J020 Streptococcal pharyngitis: Secondary | ICD-10-CM | POA: Diagnosis not present

## 2020-02-29 MED ORDER — PREDNISONE 20 MG PO TABS: 20.0000 mg | ORAL_TABLET | Freq: Every day | ORAL | 0 refills | Status: DC

## 2020-02-29 MED ORDER — CARVEDILOL 3.125 MG PO TABS: 3.1250 mg | ORAL_TABLET | Freq: Two times a day (BID) | ORAL | 0 refills | Status: DC

## 2020-02-29 MED ORDER — SPIRONOLACTONE 25 MG PO TABS: 25.0000 mg | ORAL_TABLET | Freq: Every day | ORAL | 0 refills | Status: DC

## 2020-02-29 MED ORDER — SACUBITRIL-VALSARTAN 24-26 MG PO TABS: 1.0000 | ORAL_TABLET | Freq: Two times a day (BID) | ORAL | 0 refills | Status: DC

## 2020-02-29 MED ORDER — AZITHROMYCIN 250 MG PO TABS: 250.0000 mg | ORAL_TABLET | Freq: Every day | ORAL | 0 refills | Status: DC

## 2020-02-29 MED ORDER — DAPAGLIFLOZIN PROPANEDIOL 10 MG PO TABS
10.0000 mg | ORAL_TABLET | Freq: Every day | ORAL | 7 refills | Status: DC
Start: 2020-02-29 — End: 2021-08-20

## 2020-02-29 NOTE — ED Notes (Signed)
Patient left.

## 2020-02-29 NOTE — Progress Notes (Signed)
New Patient Office Visit  Subjective:  Patient ID: Robin Gallegos, female    DOB: 08-03-1991  Age: 29 y.o. MRN: [id number]  CC:  To establish care  HPI Robin Gallegos is a 29 year old A.A female presenting to establish care. Health history and medications discussed and reviewed. Pt has h/o gestational DM and pre eclampsia, CHF EF 20% post delivery in 10/2019. She is due for repeated Echo that she has not been able to make an appointment. She has cardiology in place but unable to get her refill request on CHF controlling medications so I will refill 30 days today. Pt was dx with strep throat record reviewed, she had rash and was told to stop taking the antibiotic after the 4th day and follow up with pcp for new antibiotic.  No loss of taste, smell, nasal congestion, cough, headache, general body aches, positive for continued sore throat. No fever/chills. No cp/ct, gu/gi sxs, pain, edema, sob, or falls.   Past Medical History:  Diagnosis Date  . Gestational diabetes mellitus (GDM)   . Pre-eclampsia    Social History   Socioeconomic History  . Marital status: Single    Spouse name: Not on file  . Number of children: Not on file  . Years of education: Not on file  . Highest education level: Not on file  Occupational History  . Not on file  Tobacco Use  . Smoking status: Never Smoker  . Smokeless tobacco: Never Used  Substance and Sexual Activity  . Alcohol use: Not Currently  . Drug use: Not Currently  . Sexual activity: Yes  Other Topics Concern  . Not on file  Social History Narrative  . Not on file   Social Determinants of Health   Financial Resource Strain:   . Difficulty of Paying Living Expenses:   Food Insecurity:   . Worried About Programme researcher, broadcasting/film/video in the Last Year:   . Barista in the Last Year:   Transportation Needs:   . Freight forwarder (Medical):   Marland Kitchen Lack of Transportation (Non-Medical):   Physical Activity:   . Days of Exercise per Week:   .  Minutes of Exercise per Session:   Stress:   . Feeling of Stress :   Social Connections:   . Frequency of Communication with Friends and Family:   . Frequency of Social Gatherings with Friends and Family:   . Attends Religious Services:   . Active Member of Clubs or Organizations:   . Attends Banker Meetings:   Marland Kitchen Marital Status:   Intimate Partner Violence:   . Fear of Current or Ex-Partner:   . Emotionally Abused:   Marland Kitchen Physically Abused:   . Sexually Abused:     ROS Review of Systems  All other systems reviewed and are negative.  Objective:   Today's Vitals: BP 128/88 (BP Location: Left Arm, Patient Position: Sitting, Cuff Size: Normal)   Pulse 95   Temp 98.4 F (36.9 C) (Oral)   Resp 18   Ht 5\' 4"  (1.626 m)   Wt 195 lb 9.6 oz (88.7 kg)   SpO2 98%   BMI 33.57 kg/m   Physical Exam Constitutional:      Appearance: Normal appearance. She is well-developed and well-groomed. She is not ill-appearing.  HENT:     Head: Normocephalic.     Right Ear: Hearing, tympanic membrane, ear canal and external ear normal.     Left Ear: Hearing, tympanic membrane, ear  canal and external ear normal.     Nose: Nose normal.     Mouth/Throat:     Lips: Pink.     Mouth: Mucous membranes are dry.     Pharynx: Posterior oropharyngeal erythema present.     Comments: See hpi Eyes:     Extraocular Movements: Extraocular movements intact.     Conjunctiva/sclera: Conjunctivae normal.     Pupils: Pupils are equal, round, and reactive to light.  Neck:     Vascular: No carotid bruit.  Cardiovascular:     Rate and Rhythm: Normal rate and regular rhythm.     Pulses: Normal pulses.     Heart sounds: Normal heart sounds, S1 normal and S2 normal.  Pulmonary:     Effort: Pulmonary effort is normal.     Breath sounds: Normal breath sounds.  Chest:     Chest wall: No deformity.  Abdominal:     General: Abdomen is flat. Bowel sounds are normal. There is no abdominal bruit.      Palpations: Abdomen is soft. There is no hepatomegaly or splenomegaly.  Musculoskeletal:     Cervical back: Normal range of motion and neck supple.     Right lower leg: No edema.     Left lower leg: No edema.  Lymphadenopathy:     Head:     Right side of head: Tonsillar adenopathy present. No submental, submandibular, preauricular, posterior auricular or occipital adenopathy.     Left side of head: Tonsillar adenopathy present. No submental, submandibular, preauricular, posterior auricular or occipital adenopathy.     Cervical: No cervical adenopathy.  Skin:    General: Skin is warm and dry.     Capillary Refill: Capillary refill takes less than 2 seconds.     Findings: Rash present. No erythema.          Comments: pruritic rash  Neurological:     General: No focal deficit present.     Mental Status: She is alert and oriented to person, place, and time.  Psychiatric:        Attention and Perception: Attention normal.        Mood and Affect: Mood normal.        Speech: Speech normal.        Behavior: Behavior normal. Behavior is cooperative.        Cognition and Memory: Cognition normal.        Judgment: Judgment normal.     Assessment & Plan:  Make appointment as your cardiologist ordered and planned for repeat ECHO as soon as possible.  Refill for 30 days on CHF management medication as you are about to run out and having difficulty making appointment. Rash: you have reported stopped the antibiotic that caused the rash,RX azithromycin for treatment of your strep throat. treatment for strep throat. Also will add to allergy list. May take benadryl at night, Pepcid twice a day, and Claritin at daytime all over the counter. Will Rx prednisone for 3 days. Follow up for non resolving or worsening sxs.   Problem List Items Addressed This Visit    None    Visit Diagnoses    Encounter for medical examination to establish care    -  Primary   Relevant Orders   Lipid panel   CBC with  Differential/Platelet   COMPLETE METABOLIC PANEL WITH GFR   Lipid screening       Relevant Orders   Lipid panel   CBC with Differential/Platelet   COMPLETE METABOLIC  PANEL WITH GFR   Screening for metabolic disorder       Relevant Orders   Lipid panel   CBC with Differential/Platelet   COMPLETE METABOLIC PANEL WITH GFR   H/O gestational diabetes mellitus, not currently pregnant       Relevant Orders   Hemoglobin A1c   Drug induced allergic contact dermatitis          Follow-up: Return in about 6 months (around 08/31/2020) for ape with labs one week prior.   Annie Main, FNP

## 2020-03-01 ENCOUNTER — Other Ambulatory Visit: Payer: Self-pay | Admitting: Nurse Practitioner

## 2020-03-01 DIAGNOSIS — D509 Iron deficiency anemia, unspecified: Secondary | ICD-10-CM

## 2020-03-01 DIAGNOSIS — I5022 Chronic systolic (congestive) heart failure: Secondary | ICD-10-CM

## 2020-03-01 DIAGNOSIS — R7989 Other specified abnormal findings of blood chemistry: Secondary | ICD-10-CM

## 2020-03-01 LAB — CBC WITH DIFFERENTIAL/PLATELET
Absolute Monocytes: 711 cells/uL (ref 200–950)
Basophils Absolute: 40 cells/uL (ref 0–200)
Basophils Relative: 0.5 %
Eosinophils Absolute: 166 cells/uL (ref 15–500)
Eosinophils Relative: 2.1 %
HCT: 38.2 % (ref 35.0–45.0)
Hemoglobin: 12.2 g/dL (ref 11.7–15.5)
Lymphs Abs: 2781 cells/uL (ref 850–3900)
MCH: 25.5 pg — ABNORMAL LOW (ref 27.0–33.0)
MCHC: 31.9 g/dL — ABNORMAL LOW (ref 32.0–36.0)
MCV: 79.7 fL — ABNORMAL LOW (ref 80.0–100.0)
MPV: 9.8 fL (ref 7.5–12.5)
Monocytes Relative: 9 %
Neutro Abs: 4203 cells/uL (ref 1500–7800)
Neutrophils Relative %: 53.2 %
Platelets: 487 10*3/uL — ABNORMAL HIGH (ref 140–400)
RBC: 4.79 10*6/uL (ref 3.80–5.10)
RDW: 13.5 % (ref 11.0–15.0)
Total Lymphocyte: 35.2 %
WBC: 7.9 10*3/uL (ref 3.8–10.8)

## 2020-03-01 LAB — COMPLETE METABOLIC PANEL WITH GFR
AG Ratio: 1.2 (calc) (ref 1.0–2.5)
ALT: 14 U/L (ref 6–29)
AST: 12 U/L (ref 10–30)
Albumin: 4 g/dL (ref 3.6–5.1)
Alkaline phosphatase (APISO): 51 U/L (ref 31–125)
BUN: 12 mg/dL (ref 7–25)
CO2: 25 mmol/L (ref 20–32)
Calcium: 9.8 mg/dL (ref 8.6–10.2)
Chloride: 107 mmol/L (ref 98–110)
Creat: 0.82 mg/dL (ref 0.50–1.10)
GFR, Est African American: 111 mL/min/{1.73_m2} (ref >=60)
GFR, Est Non African American: 95 mL/min/{1.73_m2} (ref >=60)
Globulin: 3.3 g/dL (calc) (ref 1.9–3.7)
Glucose, Bld: 76 mg/dL (ref 65–99)
Potassium: 4.6 mmol/L (ref 3.5–5.3)
Sodium: 138 mmol/L (ref 135–146)
Total Bilirubin: 0.2 mg/dL (ref 0.2–1.2)
Total Protein: 7.3 g/dL (ref 6.1–8.1)

## 2020-03-01 LAB — LIPID PANEL
Cholesterol: 108 mg/dL (ref <200)
HDL: 46 mg/dL — ABNORMAL LOW (ref >=50)
LDL Cholesterol (Calc): 40 mg/dL (calc)
Non-HDL Cholesterol (Calc): 62 mg/dL (calc) (ref <130)
Total CHOL/HDL Ratio: 2.3 (calc) (ref <5.0)
Triglycerides: 132 mg/dL (ref <150)

## 2020-03-01 LAB — HEMOGLOBIN A1C
Hgb A1c MFr Bld: 5.6 % of total Hgb (ref <5.7)
Mean Plasma Glucose: 114 (calc)
eAG (mmol/L): 6.3 (calc)

## 2020-03-01 NOTE — Progress Notes (Signed)
Have to come in for UA and blood draw, does not need to see me at this time. Her platelets are mildly elevated and other components so I would like to r/o urinary infection and labs to better evaluate.

## 2020-03-07 ENCOUNTER — Other Ambulatory Visit: Payer: Self-pay | Admitting: Nurse Practitioner

## 2020-03-07 ENCOUNTER — Other Ambulatory Visit: Payer: 59

## 2020-03-07 DIAGNOSIS — R7989 Other specified abnormal findings of blood chemistry: Secondary | ICD-10-CM

## 2020-03-07 DIAGNOSIS — R7309 Other abnormal glucose: Secondary | ICD-10-CM

## 2020-03-07 LAB — CBC WITH DIFFERENTIAL/PLATELET
Absolute Monocytes: 408 cells/uL (ref 200–950)
Basophils Absolute: 60 cells/uL (ref 0–200)
Basophils Relative: 0.7 %
Eosinophils Absolute: 60 cells/uL (ref 15–500)
Eosinophils Relative: 0.7 %
HCT: 37.2 % (ref 35.0–45.0)
Hemoglobin: 11.9 g/dL (ref 11.7–15.5)
Lymphs Abs: 3511 cells/uL (ref 850–3900)
MCH: 25.8 pg — ABNORMAL LOW (ref 27.0–33.0)
MCHC: 32 g/dL (ref 32.0–36.0)
MCV: 80.7 fL (ref 80.0–100.0)
MPV: 9.9 fL (ref 7.5–12.5)
Monocytes Relative: 4.8 %
Neutro Abs: 4463 cells/uL (ref 1500–7800)
Neutrophils Relative %: 52.5 %
Platelets: 471 10*3/uL — ABNORMAL HIGH (ref 140–400)
RBC: 4.61 10*6/uL (ref 3.80–5.10)
RDW: 14.1 % (ref 11.0–15.0)
Total Lymphocyte: 41.3 %
WBC: 8.5 10*3/uL (ref 3.8–10.8)

## 2020-03-08 ENCOUNTER — Other Ambulatory Visit: Payer: Self-pay | Admitting: Nurse Practitioner

## 2020-03-08 DIAGNOSIS — R7989 Other specified abnormal findings of blood chemistry: Secondary | ICD-10-CM | POA: Insufficient documentation

## 2020-03-08 DIAGNOSIS — R7309 Other abnormal glucose: Secondary | ICD-10-CM | POA: Insufficient documentation

## 2020-03-08 LAB — URINALYSIS, ROUTINE W REFLEX MICROSCOPIC
Bacteria, UA: NONE SEEN /HPF
Bilirubin Urine: NEGATIVE
Hyaline Cast: NONE SEEN /LPF
Ketones, ur: NEGATIVE
Leukocytes,Ua: NEGATIVE
Nitrite: NEGATIVE
Protein, ur: NEGATIVE
Specific Gravity, Urine: 1.03 (ref 1.001–1.03)
WBC, UA: NONE SEEN /HPF (ref 0–5)
pH: <=5 (ref 5.0–8.0)

## 2020-03-08 NOTE — Progress Notes (Signed)
Improved labs see her in 6 months

## 2020-03-16 ENCOUNTER — Other Ambulatory Visit: Payer: Self-pay

## 2020-03-16 ENCOUNTER — Ambulatory Visit (HOSPITAL_COMMUNITY): Payer: 59 | Attending: Internal Medicine

## 2020-03-16 DIAGNOSIS — I5022 Chronic systolic (congestive) heart failure: Secondary | ICD-10-CM | POA: Insufficient documentation

## 2020-03-16 MED ORDER — PERFLUTREN LIPID MICROSPHERE
1.0000 mL | INTRAVENOUS | Status: AC | PRN
  Administered 2020-03-16: 2 mL via INTRAVENOUS

## 2020-03-19 ENCOUNTER — Other Ambulatory Visit: Payer: Self-pay | Admitting: Nurse Practitioner

## 2020-03-19 ENCOUNTER — Other Ambulatory Visit
Admission: RE | Admit: 2020-03-19 | Discharge: 2020-03-19 | Disposition: A | Discharge status: Home / Self Care | Payer: MEDICAID | Source: Ambulatory Visit | Attending: Obstetrics and Gynecology | Admitting: Obstetrics and Gynecology

## 2020-03-19 DIAGNOSIS — Z3202 Encounter for pregnancy test, result negative: Secondary | ICD-10-CM | POA: Insufficient documentation

## 2020-03-19 LAB — CBC AND DIFFERENTIAL
Baso # K/uL: 0 10*3/uL (ref 0.0–0.1)
Basophil %: 0.2 %
Eos # K/uL: 0.3 10*3/uL (ref 0.0–0.4)
Eosinophil %: 4.6 %
Hematocrit: 35 % (ref 34–45)
Hemoglobin: 11.7 g/dL (ref 11.2–15.7)
IMM Granulocytes #: 0 10*3/uL (ref 0.0–0.0)
IMM Granulocytes: 0.3 %
Lymph # K/uL: 2.9 10*3/uL (ref 1.2–3.7)
Lymphocyte %: 46.3 %
MCH: 32 pg (ref 26–32)
MCHC: 34 g/dL (ref 32–36)
MCV: 95 fL (ref 79–95)
Mono # K/uL: 0.6 10*3/uL (ref 0.2–0.9)
Monocyte %: 8.7 %
Neut # K/uL: 2.5 10*3/uL (ref 1.6–6.1)
Nucl RBC # K/uL: 0 10*3/uL (ref 0.0–0.0)
Nucl RBC %: 0 /100 WBC (ref 0.0–0.2)
Platelets: 250 10*3/uL (ref 160–370)
RBC: 3.7 MIL/uL — ABNORMAL LOW (ref 3.9–5.2)
RDW: 12.3 % (ref 11.7–14.4)
Seg Neut %: 39.9 %
WBC: 6.3 10*3/uL (ref 4.0–10.0)

## 2020-03-19 LAB — COMPREHENSIVE METABOLIC PANEL
ALT: 16 U/L (ref 0–35)
AST: 16 U/L (ref 0–35)
Albumin: 4 g/dL (ref 3.5–5.2)
Alk Phos: 61 U/L (ref 35–105)
Anion Gap: 8 (ref 7–16)
Bilirubin,Total: 0.3 mg/dL (ref 0.0–1.2)
CO2: 25 mmol/L (ref 20–28)
Calcium: 8.9 mg/dL (ref 8.8–10.2)
Chloride: 106 mmol/L (ref 96–108)
Creatinine: 0.83 mg/dL (ref 0.51–0.95)
GFR,Black: 109 *
GFR,Caucasian: 94 *
Glucose: 80 mg/dL (ref 60–99)
Lab: 9 mg/dL (ref 6–20)
Potassium: 4.4 mmol/L (ref 3.3–5.1)
Sodium: 139 mmol/L (ref 133–145)
Total Protein: 6.9 g/dL (ref 6.3–7.7)

## 2020-04-12 ENCOUNTER — Emergency Department
Admission: EM | Admit: 2020-04-12 | Discharge: 2020-04-12 | Disposition: A | Discharge status: Home / Self Care | Payer: PRIVATE HEALTH INSURANCE | Source: Ambulatory Visit | Attending: Emergency Medicine | Admitting: Emergency Medicine

## 2020-04-12 ENCOUNTER — Encounter: Payer: Self-pay | Admitting: Medical

## 2020-04-12 ENCOUNTER — Emergency Department: Payer: PRIVATE HEALTH INSURANCE

## 2020-04-12 DIAGNOSIS — Y998 Other external cause status: Secondary | ICD-10-CM | POA: Insufficient documentation

## 2020-04-12 DIAGNOSIS — T8131XA Disruption of external operation (surgical) wound, not elsewhere classified, initial encounter: Secondary | ICD-10-CM | POA: Insufficient documentation

## 2020-04-12 DIAGNOSIS — T8130XA Disruption of wound, unspecified, initial encounter: Secondary | ICD-10-CM

## 2020-04-12 DIAGNOSIS — R188 Other ascites: Secondary | ICD-10-CM

## 2020-04-12 DIAGNOSIS — Y9289 Other specified places as the place of occurrence of the external cause: Secondary | ICD-10-CM | POA: Insufficient documentation

## 2020-04-12 DIAGNOSIS — Y9389 Activity, other specified: Secondary | ICD-10-CM | POA: Insufficient documentation

## 2020-04-12 DIAGNOSIS — L7634 Postprocedural seroma of skin and subcutaneous tissue following other procedure: Secondary | ICD-10-CM | POA: Insufficient documentation

## 2020-04-12 DIAGNOSIS — X58XXXA Exposure to other specified factors, initial encounter: Secondary | ICD-10-CM | POA: Insufficient documentation

## 2020-04-12 DIAGNOSIS — R0602 Shortness of breath: Secondary | ICD-10-CM

## 2020-04-12 DIAGNOSIS — R109 Unspecified abdominal pain: Secondary | ICD-10-CM

## 2020-04-12 LAB — CBC AND DIFFERENTIAL
Baso # K/uL: 0 10*3/uL (ref 0.0–0.1)
Basophil %: 0.2 %
Eos # K/uL: 0.4 10*3/uL (ref 0.0–0.4)
Eosinophil %: 4.6 %
Hematocrit: 27 % — ABNORMAL LOW (ref 34–45)
Hemoglobin: 8.4 g/dL — ABNORMAL LOW (ref 11.2–15.7)
IMM Granulocytes #: 0.1 10*3/uL — ABNORMAL HIGH (ref 0.0–0.0)
IMM Granulocytes: 0.8 %
Lymph # K/uL: 1.9 10*3/uL (ref 1.2–3.7)
Lymphocyte %: 22.3 %
MCH: 32 pg (ref 26–32)
MCHC: 31 g/dL — ABNORMAL LOW (ref 32–36)
MCV: 103 fL — ABNORMAL HIGH (ref 79–95)
Mono # K/uL: 0.5 10*3/uL (ref 0.2–0.9)
Monocyte %: 5.5 %
Neut # K/uL: 5.7 10*3/uL (ref 1.6–6.1)
Nucl RBC # K/uL: 0 10*3/uL (ref 0.0–0.0)
Nucl RBC %: 0 /100 WBC (ref 0.0–0.2)
Platelets: 397 10*3/uL — ABNORMAL HIGH (ref 160–370)
RBC: 2.7 MIL/uL — ABNORMAL LOW (ref 3.9–5.2)
RDW: 14.1 % (ref 11.7–14.4)
Seg Neut %: 66.6 %
WBC: 8.6 10*3/uL (ref 4.0–10.0)

## 2020-04-12 LAB — PLASMA PROF 7 (ED ONLY)
Anion Gap,PL: 10 (ref 7–16)
CO2,Plasma: 25 mmol/L (ref 20–28)
Chloride,Plasma: 104 mmol/L (ref 96–108)
Creatinine: 0.64 mg/dL (ref 0.51–0.95)
GFR,Black: 137 *
GFR,Caucasian: 119 *
Glucose,Plasma: 87 mg/dL (ref 60–99)
Potassium,Plasma: 4.1 mmol/L (ref 3.3–4.6)
Sodium,Plasma: 139 mmol/L (ref 133–145)
UN,Plasma: 8 mg/dL (ref 6–20)

## 2020-04-12 LAB — RUQ PANEL (ED ONLY)
ALT: 27 U/L (ref 0–35)
AST: 28 U/L (ref 0–35)
Albumin: 3.6 g/dL (ref 3.5–5.2)
Alk Phos: 59 U/L (ref 35–105)
Amylase: 91 U/L (ref 28–100)
Bilirubin,Direct: <0.2 mg/dL (ref 0.0–0.3)
Bilirubin,Total: 0.3 mg/dL (ref 0.0–1.2)
Lipase: 43 U/L (ref 13–60)
Total Protein: 6.6 g/dL (ref 6.3–7.7)

## 2020-04-12 LAB — PREGNANCY TEST, SERUM: Preg,Serum: NEGATIVE

## 2020-04-12 LAB — HOLD BLUE

## 2020-04-12 MED ORDER — STERILE WATER FOR IRRIGATION IR SOLN *I*
900.0000 mL | Freq: Once | Status: AC
Start: 2020-04-12 — End: 2020-04-12
  Administered 2020-04-12: 900 mL via ORAL

## 2020-04-12 MED ORDER — IOHEXOL 350 MG/ML (OMNIPAQUE) IV SOLN *I*
1.0000 mL | Freq: Once | INTRAVENOUS | Status: AC
Start: 2020-04-12 — End: 2020-04-12
  Administered 2020-04-12: 129 mL via INTRAVENOUS

## 2020-04-12 NOTE — First Provider Contact (Signed)
ED First Provider Contact Note    Initial provider evaluation performed by   ED First Provider Contact     Date/Time Event User Comments    04/12/20 1211 ED First Provider Contact Kalan Rinn Initial Face to Face Provider Contact          Vital signs reviewed.    Assessment: 29 yo woman, reports abdominoplasty and liposuction on April 16 in [place], states wound has opened and there is some drainage.  Feels otherwise well.  Unable to visualize wound in triage.    Orders placed:  No intervention     Patient requires further evaluation.     Maclain Cohron ANN Sabastian Raimondi, PA, 04/12/2020, 12:11 PM     Hazle Quant, [place]  04/12/20 1212

## 2020-04-12 NOTE — ED Notes (Signed)
Pt with abd pain after tummy tuck 4/16. Pt reports drainage at site. States she feels pain in incision with deep breathing or coughing. Denies chest pain, lightheadedness, dizziness.

## 2020-04-12 NOTE — ED Triage Notes (Signed)
S/p "tummy tuck with liposuction surgery" on 03/30/20, in [place], [place] on April 16th. C/o wound with drainage to the surgical site on the left side of her abdomen. Denies fever or chills.       Triage Note   Donna Bernard, RN

## 2020-04-12 NOTE — ED Provider Notes (Signed)
History     Chief Complaint   Patient presents with    Post-op Problem     Patient is a 29 year old female status post abdominoplasty and liposuction in [place] on April 16 who presents to ED with abdominal pain and wound dehiscence.  Patient notes that she has 2 areas of wound dehiscence that developed 4 days ago and noticed today that she was having some "smelly drainage" from wounds. Pt also endorses some chills and mild shortness of breath. Pt is having some abdominal pain more on the sides of her abdomen where she feels her skin feels tighter.  Patient denies any nausea or vomiting.      History provided by:  Patient and medical records  Language interpreter used: No        Medical/Surgical/Family History     History reviewed. No pertinent past medical history.     There is no problem list on file for this patient.           History reviewed. No pertinent surgical history.  No family history on file.       Social History     Tobacco Use    Smoking status: Not on file   Substance Use Topics    Alcohol use: Not on file    Drug use: Not on file     Living Situation     Questions Responses    Patient lives with Alone    Homeless     Caregiver for other family member     External Services     Employment     Domestic Violence Risk                 Review of Systems   Review of Systems   Constitutional: Negative for chills, fatigue and fever.   Eyes: Negative for visual disturbance.   Respiratory: Positive for shortness of breath. Negative for wheezing.    Cardiovascular: Negative for chest pain, palpitations and leg swelling.   Gastrointestinal: Positive for abdominal pain. Negative for constipation, diarrhea, nausea and vomiting.   Genitourinary: Negative for dysuria.   Skin: Positive for wound. Negative for rash.   Neurological: Negative for dizziness, weakness, light-headedness and headaches.       Physical Exam     Triage Vitals  Triage Start: Start, (04/12/20 1207)   First Recorded BP: 127/69, Resp: 18,  Temp: 36.3 C (97.3 F), Temp src: TEMPORAL Oxygen Therapy SpO2: 100 %, Oximetry Source: Lt Hand, O2 Device: None (Room air), Heart Rate: 83, (04/12/20 1212)  .  First Pain Reported  0-10 Scale: 5, (04/12/20 1212)       Physical Exam  Vitals and nursing note reviewed.   Constitutional:       General: She is not in acute distress.     Appearance: Normal appearance. She is not ill-appearing.   HENT:      Head: Normocephalic and atraumatic.      Mouth/Throat:      Mouth: Mucous membranes are moist.   Eyes:      Extraocular Movements: Extraocular movements intact.   Cardiovascular:      Rate and Rhythm: Normal rate and regular rhythm.      Pulses: Normal pulses.      Heart sounds: Normal heart sounds.   Pulmonary:      Effort: Pulmonary effort is normal.      Breath sounds: Normal breath sounds.   Abdominal:      General: Abdomen is flat. Bowel  sounds are normal.      Palpations: Abdomen is soft.      Tenderness: There is no abdominal tenderness. There is no guarding.   Musculoskeletal:         General: Normal range of motion.   Skin:     General: Skin is warm and dry.      Comments: 2 areas of wound dehiscence. One each side of her abdomen. No significant erythema. Serosanguinous drainage   Neurological:      Mental Status: She is alert and oriented to person, place, and time.                 Madison Munson, PA,certify that the patient has authorized the use of photography for the purpose of the provision of health care at the [facility] and affiliates and they understand that it will be included in the legal medical record.    Medical Decision Making   Patient seen by me on:  04/12/2020    Assessment:  Patient is a 29 year old female status post abdominoplasty and liposuction in [place] on April 16 who presents to ED with abdominal pain and wound dehiscence. VSS. Exam noted above.    Differential diagnosis:  Most likely pt has seroma, doubt infectious given pt is afebrile. Pt's  abdomen is soft, no concern for abdominal compartment syndrome or construction. Wound dehiscence. No erythema or purulent drainage concerning for infection.    Plan:  Orders Placed This Encounter      CT abdomen and pelvis with contrast      EXTERNAL IMAGES CT ABDOMEN PELVIS      CBC and differential      Plasma profile [facility] ED only)      RUQ panel      HCG, serum qualitative, pregnancy      Hold blue      ED/UC REFERRAL TO SURGERY        Independent review of: Existing labs, CT scans, chart/prior records    ED Course and Disposition:  No leukocytosis.     CT abd/pelvis: Status post abdominoplasty. There is diffuse body wall edema. There is extensive soft tissue edema and subcutaneous fluid which spans the entire length of the abdominal wall, 1.5 cm in thickness, which is superficial to the abdominal wall muscles. There   are two fluid collections within the abdominoplasty between the rectus muscles measuring approximately 5.9 cm and 2.5 cm in maximum dimension, superior and inferior to the umbilicus, respectively. There is no rim enhancement to suggest infection,   therefore these are likely postoperative seromas. Correlate with physical exam and labs.    No intraperitoneal collections or free air.     No signs of infection. Seroma is normal post-op. Pt can continue wound care as she has been doing at home. Will refer to plastics for wound dehiscence as pt can't follow up with surgeon in [place]. Return precautions given. Pt agreeable to plan.            Madison Rose, [place]          Madison Rose, [place]  04/13/20 1153

## 2020-04-12 NOTE — Discharge Instructions (Addendum)
You were seen in the [facility] Emergency Department for abdominal pain and wound issue.  Your work up has included CT and labs.  It is very important to follow up as recommended and take all medications prescribed.      Your CT showed a seroma which is a fluid collection.  It does not appear based on your labs that it is infected.  You also have 2 areas of wound dehiscence.  I will refer you to a plastic surgeon to follow-up with.  Please continue to do the wound care management you have been doing at home.    Medications:  Continue your routine medications as previously prescribed.    Follow Up:  Follow up with your primary care doctor within 2-3 days of discharge.  Please follow up with plastic surgery    Other Information:   Please read all attached information provided.    Return to the emergency department with any worsening or concerning signs or symptoms including fever, worsening abdominal pain, pus draining from wound.      Thank you for allowing [place] to care for you.

## 2020-04-19 ENCOUNTER — Other Ambulatory Visit: Payer: Self-pay

## 2020-04-19 MED ORDER — HYDROCORTISONE 1 % EX CREA *I*
TOPICAL_CREAM | CUTANEOUS | 2 refills | Status: DC
Start: 2020-04-19 — End: 2023-12-23
  Filled 2020-04-19: qty 28, 7d supply, fill #0

## 2020-04-20 ENCOUNTER — Other Ambulatory Visit: Payer: Self-pay

## 2020-04-20 MED ORDER — FLUCONAZOLE 150 MG PO TABS *I*
150.00 mg | ORAL_TABLET | Freq: Every day | ORAL | 0 refills | Status: DC
Start: 2020-04-20 — End: 2020-07-12
  Filled 2020-04-20: qty 2, 3d supply, fill #0

## 2020-05-23 ENCOUNTER — Other Ambulatory Visit: Payer: Self-pay

## 2020-05-23 ENCOUNTER — Other Ambulatory Visit
Admission: RE | Admit: 2020-05-23 | Discharge: 2020-05-23 | Disposition: A | Discharge status: Home / Self Care | Payer: MEDICAID | Source: Ambulatory Visit | Attending: Internal Medicine | Admitting: Internal Medicine

## 2020-05-23 DIAGNOSIS — R300 Dysuria: Secondary | ICD-10-CM | POA: Insufficient documentation

## 2020-05-23 MED ORDER — CLOTRIMAZOLE 1 % VA CREA *I*
TOPICAL_CREAM | VAGINAL | 0 refills | Status: DC
Start: 2020-05-23 — End: 2023-12-23
  Filled 2020-05-23: qty 45, 7d supply, fill #0

## 2020-05-24 LAB — AEROBIC CULTURE: Aerobic Culture: 0

## 2020-05-25 ENCOUNTER — Other Ambulatory Visit: Payer: Self-pay

## 2020-07-12 ENCOUNTER — Other Ambulatory Visit
Admission: RE | Admit: 2020-07-12 | Discharge: 2020-07-12 | Disposition: A | Discharge status: Home / Self Care | Payer: MEDICAID | Source: Ambulatory Visit | Attending: Obstetrics and Gynecology | Admitting: Obstetrics and Gynecology

## 2020-07-12 ENCOUNTER — Other Ambulatory Visit: Payer: Self-pay

## 2020-07-12 DIAGNOSIS — R350 Frequency of micturition: Secondary | ICD-10-CM | POA: Insufficient documentation

## 2020-07-12 LAB — DATE/TIME NOT PROVIDED

## 2020-07-12 MED ORDER — PHENAZOPYRIDINE HCL 200 MG PO TABS *I*
200.0000 mg | ORAL_TABLET | Freq: Three times a day (TID) | ORAL | 0 refills | Status: DC
Start: 2020-07-12 — End: 2023-12-23
  Filled 2020-07-12: qty 6, 2d supply, fill #0

## 2020-07-12 MED ORDER — FLUCONAZOLE 150 MG PO TABS *I*
150.0000 mg | ORAL_TABLET | Freq: Once | ORAL | 0 refills | Status: DC
Start: 2020-07-12 — End: 2020-10-01
  Filled 2020-07-12: qty 1, 1d supply, fill #0

## 2020-07-12 MED ORDER — METRONIDAZOLE 500 MG PO TABS *I*
500.0000 mg | ORAL_TABLET | Freq: Two times a day (BID) | ORAL | 0 refills | Status: DC
Start: 2020-07-12 — End: 2020-09-28
  Filled 2020-07-12: qty 14, 7d supply, fill #0

## 2020-07-13 LAB — AEROBIC CULTURE: Aerobic Culture: 0

## 2020-07-23 ENCOUNTER — Other Ambulatory Visit: Payer: Self-pay

## 2020-07-29 ENCOUNTER — Encounter (HOSPITAL_COMMUNITY): Payer: Self-pay | Admitting: Emergency Medicine

## 2020-07-29 ENCOUNTER — Emergency Department (HOSPITAL_COMMUNITY)
Admission: EM | Admit: 2020-07-29 | Discharge: 2020-07-29 | Disposition: A | Discharge status: Home / Self Care | Payer: No Typology Code available for payment source | Attending: Emergency Medicine | Admitting: Emergency Medicine

## 2020-07-29 ENCOUNTER — Other Ambulatory Visit: Payer: Self-pay

## 2020-07-29 DIAGNOSIS — H5712 Ocular pain, left eye: Secondary | ICD-10-CM | POA: Diagnosis present

## 2020-07-29 DIAGNOSIS — R0981 Nasal congestion: Secondary | ICD-10-CM | POA: Diagnosis not present

## 2020-07-29 DIAGNOSIS — E1169 Type 2 diabetes mellitus with other specified complication: Secondary | ICD-10-CM | POA: Insufficient documentation

## 2020-07-29 DIAGNOSIS — B309 Viral conjunctivitis, unspecified: Secondary | ICD-10-CM | POA: Insufficient documentation

## 2020-07-29 DIAGNOSIS — R0982 Postnasal drip: Secondary | ICD-10-CM | POA: Diagnosis not present

## 2020-07-29 DIAGNOSIS — Z7984 Long term (current) use of oral hypoglycemic drugs: Secondary | ICD-10-CM | POA: Insufficient documentation

## 2020-07-29 DIAGNOSIS — I509 Heart failure, unspecified: Secondary | ICD-10-CM | POA: Insufficient documentation

## 2020-07-29 MED ORDER — ERYTHROMYCIN 5 MG/GM OP OINT: TOPICAL_OINTMENT | OPHTHALMIC | 0 refills | Status: DC

## 2020-07-29 MED ORDER — FLUORESCEIN SODIUM 1 MG OP STRP
1.0000 | ORAL_STRIP | Freq: Once | OPHTHALMIC | Status: AC
  Administered 2020-07-29: 1 via OPHTHALMIC
  Filled 2020-07-29: qty 1

## 2020-07-29 MED ORDER — TETRACAINE HCL 0.5 % OP SOLN
1.0000 [drp] | Freq: Once | OPHTHALMIC | Status: AC
  Administered 2020-07-29: 1 [drp] via OPHTHALMIC
  Filled 2020-07-29: qty 4

## 2020-07-29 NOTE — ED Provider Notes (Signed)
[facility] EMERGENCY DEPARTMENT Provider Note   CSN: [id number] Arrival date & time: 07/29/20  1635     History Chief Complaint  Patient presents with  . Eye Pain    Robin Gallegos is a 29 y.o. female.  The history is provided by the patient.  Eye Pain This is a new problem. The current episode started yesterday. The problem occurs constantly. The problem has not changed since onset.Pertinent negatives include no chest pain, no abdominal pain, no headaches and no shortness of breath. Nothing aggravates the symptoms. Nothing relieves the symptoms. She has tried nothing for the symptoms. The treatment provided no relief.       Past Medical History:  Diagnosis Date  . Gestational diabetes mellitus (GDM)   . Pre-eclampsia     Patient Active Problem List   Diagnosis Date Noted  . Elevated glucose 03/08/2020  . Elevated platelet count 03/08/2020  . Diabetes mellitus type 2 in obese (HCC) 02/29/2020  . H/O gestational diabetes mellitus, not currently pregnant 02/29/2020  . Lipid screening 02/29/2020  . CHF (congestive heart failure) (HCC) 10/04/2019  . SVD (spontaneous vaginal delivery) 08/13/2019  . Preeclampsia, third trimester 08/11/2019    History reviewed. No pertinent surgical history.   OB History    Gravida  1   Para  1   Term  1   Preterm  0   AB  0   Living  1     SAB  0   TAB  0   Ectopic  0   Multiple  0   Live Births  1           No family history on file.  Social History   Tobacco Use  . Smoking status: Never Smoker  . Smokeless tobacco: Never Used  Vaping Use  . Vaping Use: Never used  Substance Use Topics  . Alcohol use: Not Currently  . Drug use: Not Currently    Home Medications Prior to Admission medications   Medication Sig Start Date End Date Taking? Authorizing Provider  carvedilol (COREG) 3.125 MG tablet Take 1 tablet (3.125 mg total) by mouth 2 (two) times daily with a meal. 02/29/20  Yes Jenne Pane,  Crystal A, FNP  dapagliflozin propanediol (FARXIGA) 10 MG TABS tablet Take 10 mg by mouth daily before breakfast. 02/29/20  Yes Bates, Crystal A, FNP  sacubitril-valsartan (ENTRESTO) 24-26 MG Take 1 tablet by mouth 2 (two) times daily. 02/29/20  Yes Bates, Crystal A, FNP  spironolactone (ALDACTONE) 25 MG tablet Take 1 tablet (25 mg total) by mouth daily. 02/29/20  Yes Bates, Crystal A, FNP  azithromycin (ZITHROMAX) 250 MG tablet Take 1 tablet (250 mg total) by mouth daily. Take 2 tabs today then 1 tablet days two through five Patient not taking: Reported on 07/29/2020 02/29/20   Elmore Guise, FNP  erythromycin ophthalmic ointment Place a 1/2 inch ribbon of ointment into the lower eyelid. 07/29/20   Rickey Primus, MD  naproxen (NAPROSYN) 500 MG tablet Take 1 tablet (500 mg total) by mouth 2 (two) times daily between meals as needed for moderate pain. Patient not taking: Reported on 07/29/2020 02/21/20   Lorelee New, PA-C  predniSONE (DELTASONE) 20 MG tablet Take 1 tablet (20 mg total) by mouth daily. Patient not taking: Reported on 07/29/2020 02/29/20   Elmore Guise, FNP    Allergies    Penicillins and Cleocin [clindamycin]  Review of Systems   Review of Systems  Constitutional: Negative for chills  and fever.  HENT: Positive for congestion and rhinorrhea. Negative for ear pain and sore throat.   Eyes: Positive for pain. Negative for visual disturbance.  Respiratory: Negative for cough and shortness of breath.   Cardiovascular: Negative for chest pain and palpitations.  Gastrointestinal: Negative for abdominal pain and vomiting.  Genitourinary: Negative for dysuria and hematuria.  Musculoskeletal: Negative for arthralgias and back pain.  Skin: Negative for color change and rash.  Neurological: Negative for seizures, syncope and headaches.  All other systems reviewed and are negative.   Physical Exam Updated Vital Signs BP 128/87 (BP Location: Right Arm)   Pulse 72   Temp 98.9 F  (37.2 C) (Oral)   Resp 16   Ht 5\' 4"  (1.626 m)   Wt 86.2 kg   SpO2 100%   BMI 32.61 kg/m   Physical Exam Vitals and nursing note reviewed.  Constitutional:      General: She is not in acute distress.    Appearance: She is well-developed.  HENT:     Head: Normocephalic and atraumatic.  Eyes:     General: Lids are normal. Lids are everted, no foreign bodies appreciated. Vision grossly intact. Gaze aligned appropriately.        Right eye: No foreign body.        Left eye: No foreign body.     Extraocular Movements: Extraocular movements intact.     Right eye: Normal extraocular motion.     Left eye: Normal extraocular motion.     Conjunctiva/sclera:     Left eye: Left conjunctiva is injected.     Pupils: Pupils are equal, round, and reactive to light.     Comments: L conjunctival injection.  No FB identified.  R conjunctiva clear.    Cardiovascular:     Rate and Rhythm: Normal rate and regular rhythm.     Heart sounds: No murmur heard.   Pulmonary:     Effort: Pulmonary effort is normal. No respiratory distress.     Breath sounds: Normal breath sounds.  Abdominal:     Palpations: Abdomen is soft.     Tenderness: There is no abdominal tenderness.  Musculoskeletal:     Cervical back: Neck supple.  Skin:    General: Skin is warm and dry.  Neurological:     Mental Status: She is alert.     ED Results / Procedures / Treatments   Labs (all labs ordered are listed, but only abnormal results are displayed) Labs Reviewed - No data to display  EKG None  Radiology No results found.  Procedures Procedures (including critical care time)  Medications Ordered in ED Medications  tetracaine (PONTOCAINE) 0.5 % ophthalmic solution 1 drop (1 drop Both Eyes Given 07/29/20 2236)  fluorescein ophthalmic strip 1 strip (1 strip Both Eyes Given 07/29/20 2237)    ED Course  I have reviewed the triage vital signs and the nursing notes.  Pertinent labs & imaging results that were  available during my care of the patient were reviewed by me and considered in my medical decision making (see chart for details).    MDM Rules/Calculators/A&P                          29 year old female presents with left eye pain.  Patient states that her child accidentally scratched her left eye approximately 1 month ago.  Patient states that starting yesterday she started whelping worsening left eye pain.  States that she had  blurry vision when the pain was really bad at the beginning however after she took a nap states that her vision is back to normal.  Denies anyone else in the home with similar symptoms.  Denies fever, chills, headache.  Afebrile vital signs stable.  Exam as above.  Exam is notable for left conjunctival injection.  Lids everted with no evidence of foreign body.  Right conjunctive are clear.  Plan to fluorescein stain patient's eye and evaluate for corneal abrasion.    No evidence of corneal abrasion or corneal ulcer on fluorescein stain.  Patient symptoms appear consistent with viral conjunctivitis especially given her symptoms of congestion rhinorrhea.  No evidence of endophthalmitis, corneal abrasion, corneal ulcer.  Will discharge patient home with a prescription for erythromycin ointment and give her strict ED return precautions.  This was discussed with the patient who voiced agreement and understanding of the overall plan.  Patient was discharged in stable condition without further events.  Final Clinical Impression(s) / ED Diagnoses Final diagnoses:  Acute viral conjunctivitis of left eye    Rx / DC Orders ED Discharge Orders         Ordered    erythromycin ophthalmic ointment     Discontinue  Reprint     07/29/20 2205           Rickey Primus, MD 07/29/20 4034    Alvira Monday, MD 07/31/20 7425

## 2020-07-29 NOTE — Discharge Instructions (Signed)
Please place 1/4 inch of ointment on your lower eyelid 4 times a day and please wash your hands frequently.

## 2020-07-29 NOTE — ED Triage Notes (Signed)
Pt c/o left eye pain and redness after her eye was accidentally scratched by her baby's fingernail x 1 month ago.

## 2020-08-05 LAB — UNMAPPED LAB RESULTS
Basophil # (HT): 0 10 3/uL (ref 0.0–0.2)
Basophil % (HT): 0 % (ref 0–3)
Eosinophil # (HT): 0 10 3/uL (ref 0.0–0.6)
Eosinophil % (HT): 0 % (ref 0–5)
Hematocrit (HT): 36 % (ref 35–47)
Hemoglobin (HGB) (HT): 12.1 g/dL (ref 12.0–16.0)
Lymphocyte # (HT): 1.1 10 3/uL (ref 1.0–4.8)
Lymphocyte % (HT): 19 % (ref 15–45)
MCHC (HT): 33.3 g/dL (ref 31.0–37.5)
MCV (HT): 93 fL (ref 80–100)
Mean Corpuscular Hemoglobin (MCH) (HT): 30.9 pg (ref 26.0–34.0)
Monocyte # (HT): 0.3 10 3/uL (ref 0.1–1.0)
Monocyte % (HT): 6 % (ref 0–15)
Neutrophil # (HT): 4.4 10 3/uL (ref 1.8–8.0)
Platelets (HT): 256 10 3/uL (ref 150–450)
RBC (HT): 3.92 10 6/uL (ref 3.80–5.20)
RDW (HT): 12.4 % (ref 0.0–15.2)
Seg Neut % (HT): 75 % (ref 45–75)
WBC (HT): 6 10 3/uL (ref 4.0–11.0)

## 2020-09-03 ENCOUNTER — Other Ambulatory Visit: Payer: No Typology Code available for payment source

## 2020-09-03 ENCOUNTER — Other Ambulatory Visit: Payer: Self-pay

## 2020-09-05 ENCOUNTER — Other Ambulatory Visit: Payer: No Typology Code available for payment source

## 2020-09-05 ENCOUNTER — Other Ambulatory Visit: Payer: Self-pay

## 2020-09-06 LAB — CBC WITH DIFFERENTIAL/PLATELET
Absolute Monocytes: 390 cells/uL (ref 200–950)
Basophils Absolute: 49 cells/uL (ref 0–200)
Basophils Relative: 0.8 %
Eosinophils Absolute: 67 cells/uL (ref 15–500)
Eosinophils Relative: 1.1 %
HCT: 37.4 % (ref 35.0–45.0)
Hemoglobin: 11.8 g/dL (ref 11.7–15.5)
Lymphs Abs: 3105 cells/uL (ref 850–3900)
MCH: 25.7 pg — ABNORMAL LOW (ref 27.0–33.0)
MCHC: 31.6 g/dL — ABNORMAL LOW (ref 32.0–36.0)
MCV: 81.3 fL (ref 80.0–100.0)
MPV: 10.2 fL (ref 7.5–12.5)
Monocytes Relative: 6.4 %
Neutro Abs: 2489 cells/uL (ref 1500–7800)
Neutrophils Relative %: 40.8 %
Platelets: 335 10*3/uL (ref 140–400)
RBC: 4.6 10*6/uL (ref 3.80–5.10)
RDW: 14.2 % (ref 11.0–15.0)
Total Lymphocyte: 50.9 %
WBC: 6.1 10*3/uL (ref 3.8–10.8)

## 2020-09-06 LAB — COMPLETE METABOLIC PANEL WITH GFR
AG Ratio: 1.2 (calc) (ref 1.0–2.5)
ALT: 9 U/L (ref 6–29)
AST: 11 U/L (ref 10–30)
Albumin: 3.6 g/dL (ref 3.6–5.1)
Alkaline phosphatase (APISO): 36 U/L (ref 31–125)
BUN: 12 mg/dL (ref 7–25)
CO2: 26 mmol/L (ref 20–32)
Calcium: 9.1 mg/dL (ref 8.6–10.2)
Chloride: 107 mmol/L (ref 98–110)
Creat: 0.91 mg/dL (ref 0.50–1.10)
GFR, Est African American: 97 mL/min/{1.73_m2} (ref >=60)
GFR, Est Non African American: 84 mL/min/{1.73_m2} (ref >=60)
Globulin: 2.9 g/dL (calc) (ref 1.9–3.7)
Glucose, Bld: 93 mg/dL (ref 65–99)
Potassium: 4.4 mmol/L (ref 3.5–5.3)
Sodium: 138 mmol/L (ref 135–146)
Total Bilirubin: 0.3 mg/dL (ref 0.2–1.2)
Total Protein: 6.5 g/dL (ref 6.1–8.1)

## 2020-09-06 LAB — URINALYSIS, ROUTINE W REFLEX MICROSCOPIC
Bacteria, UA: NONE SEEN /HPF
Bilirubin Urine: NEGATIVE
Glucose, UA: NEGATIVE
Hyaline Cast: NONE SEEN /LPF
Ketones, ur: NEGATIVE
Leukocytes,Ua: NEGATIVE
Nitrite: NEGATIVE
Protein, ur: NEGATIVE
Specific Gravity, Urine: 1.027 (ref 1.001–1.03)
WBC, UA: NONE SEEN /HPF (ref 0–5)
pH: <=5 (ref 5.0–8.0)

## 2020-09-06 LAB — HEMOGLOBIN A1C
Hgb A1c MFr Bld: 5.6 % of total Hgb (ref <5.7)
Mean Plasma Glucose: 114 (calc)
eAG (mmol/L): 6.3 (calc)

## 2020-09-10 ENCOUNTER — Ambulatory Visit: Payer: 59 | Admitting: Nurse Practitioner

## 2020-09-12 ENCOUNTER — Ambulatory Visit: Payer: No Typology Code available for payment source | Admitting: Family Medicine

## 2020-09-19 ENCOUNTER — Encounter: Payer: Self-pay | Admitting: Family Medicine

## 2020-09-19 ENCOUNTER — Other Ambulatory Visit: Payer: Self-pay

## 2020-09-19 ENCOUNTER — Ambulatory Visit: Payer: No Typology Code available for payment source | Admitting: Family Medicine

## 2020-09-19 VITALS — BP 122/64 | HR 94 | Temp 98.3°F | Resp 16 | Ht 64.0 in | Wt 196.0 lb

## 2020-09-19 DIAGNOSIS — E669 Obesity, unspecified: Secondary | ICD-10-CM

## 2020-09-19 DIAGNOSIS — I5022 Chronic systolic (congestive) heart failure: Secondary | ICD-10-CM

## 2020-09-19 DIAGNOSIS — O903 Peripartum cardiomyopathy: Secondary | ICD-10-CM

## 2020-09-19 DIAGNOSIS — I429 Cardiomyopathy, unspecified: Secondary | ICD-10-CM | POA: Insufficient documentation

## 2020-09-19 DIAGNOSIS — E66811 Obesity, class 1: Secondary | ICD-10-CM | POA: Insufficient documentation

## 2020-09-19 NOTE — Assessment & Plan Note (Signed)
Patient continues on medications to control CHF and fluid status.  She is doing quite well her blood pressure is controlled.  She is not diabetic and does not require any medications.  Robin Gallegos is being used for her CHF.  We will refer her back to the CHF clinic.  Her last echo in April showed an ejection fraction of 20 to 25%.  I think she needs to be monitored closely by cardiology at her young age.  Discussed dietary changes reducing sodium processed food in the diet to keep her from retaining any fluid.

## 2020-09-19 NOTE — Patient Instructions (Signed)
Referral to Cardiology  Continue current medications F/U 6 months for Physical

## 2020-09-19 NOTE — Progress Notes (Signed)
   Subjective:    Patient ID: Robin Gallegos, female    DOB: [birth year], 29 y.o.   MRN: [id number]  Patient presents for Establish with New PCP (is not fasting)  Pt here to establish care with mew, she saw our NP once. Medication and history reviewed  During her pregnanty had complications of eccamplia, gestional diabetes , afterwards had cardiomypathy and CHF.  She was started on Entresto and Spirinolactone  EF 20-25% in April She does get tired and feels faint at times  No chest pain  She has been on  farxiga for CHF   She does weight regulary at home typically 196lbs    OB/GYN- [facility] Dr. Jannifer Franklin  Labs reviewed at the bedside.  Review Of Systems:  GEN- denies fatigue, fever, weight loss,weakness, recent illness HEENT- denies eye drainage, change in vision, nasal discharge, CVS- denies chest pain, palpitations RESP- denies SOB, cough, wheeze ABD- denies N/V, change in stools, abd pain GU- denies dysuria, hematuria, dribbling, incontinence MSK- denies joint pain, muscle aches, injury Neuro- denies headache, dizziness, syncope, seizure activity       Objective:    BP 122/64   Pulse 94   Temp 98.3 F (36.8 C) (Temporal)   Resp 16   Ht 5\' 4"  (1.626 m)   Wt 196 lb (88.9 kg)   LMP 09/01/2020   SpO2 94%   BMI 33.64 kg/m  GEN- NAD, alert and oriented x3 HEENT- PERRL, EOMI, non injected sclera, pink conjunctiva, MMM, oropharynx clear Neck- Supple, no thyromegaly CVS- RRR, no murmur RESP-CTAB ABD-NABS,soft,NT,ND EXT- No edema Pulses- Radial, DP- 2+        Assessment & Plan:      Problem List Items Addressed This Visit      Unprioritized   Cardiomyopathy (HCC)   Relevant Orders   AMB referral to CHF clinic   CHF (congestive heart failure) (HCC) - Primary    Patient continues on medications to control CHF and fluid status.  She is doing quite well her blood pressure is controlled.  She is not diabetic and does not require any medications.   09/03/2020 is being used for her CHF.  We will refer her back to the CHF clinic.  Her last echo in April showed an ejection fraction of 20 to 25%.  I think she needs to be monitored closely by cardiology at her young age.  Discussed dietary changes reducing sodium processed food in the diet to keep her from retaining any fluid.      Relevant Orders   AMB referral to CHF clinic   Class 1 obesity      Note: This dictation was prepared with Dragon dictation along with smaller phrase technology. Any transcriptional errors that result from this process are unintentional.

## 2020-09-26 ENCOUNTER — Other Ambulatory Visit
Admission: RE | Admit: 2020-09-26 | Discharge: 2020-09-26 | Disposition: A | Discharge status: Home / Self Care | Payer: MEDICAID | Source: Ambulatory Visit | Attending: Medical | Admitting: Medical

## 2020-09-26 ENCOUNTER — Other Ambulatory Visit: Payer: Self-pay

## 2020-09-26 DIAGNOSIS — N760 Acute vaginitis: Secondary | ICD-10-CM | POA: Insufficient documentation

## 2020-09-26 MED ORDER — FLUCONAZOLE 150 MG PO TABS *I*
150.0000 mg | ORAL_TABLET | ORAL | 0 refills | Status: DC
Start: 2020-09-26 — End: 2021-01-21
  Filled 2020-09-26: qty 2, 6d supply, fill #0

## 2020-09-27 LAB — VAGINITIS SCREEN: DNA PROBE: Vaginitis Screen:DNA Probe: POSITIVE — AB

## 2020-09-28 ENCOUNTER — Other Ambulatory Visit: Payer: Self-pay

## 2020-09-28 LAB — CHLAMYDIA PLASMID DNA AMPLIFICATION: Chlamydia Plasmid DNA Amplification: 0

## 2020-09-28 LAB — N. GONORRHOEAE DNA AMPLIFICATION: N. gonorrhoeae DNA Amplification: 0

## 2020-09-28 LAB — TRICHOMONAS DNA AMPLIFICATION: Trichomonas DNA amplification: 0

## 2020-09-28 MED ORDER — METRONIDAZOLE 500 MG PO TABS *I*
500.00 mg | ORAL_TABLET | Freq: Two times a day (BID) | ORAL | 0 refills | Status: DC
Start: 2020-09-28 — End: 2020-10-01
  Filled 2020-09-28: qty 14, 7d supply, fill #0

## 2020-10-01 ENCOUNTER — Other Ambulatory Visit: Payer: Self-pay

## 2020-10-01 MED ORDER — DOXYCYCLINE HYCLATE 100 MG PO TABS *I*
100.0000 mg | ORAL_TABLET | Freq: Two times a day (BID) | ORAL | 0 refills | Status: AC
Start: 2020-10-01 — End: 2020-10-11
  Filled 2020-10-01: qty 20, 10d supply, fill #0

## 2020-10-01 MED ORDER — METRONIDAZOLE 0.75 % VA GEL *I*
1.00 | Freq: Every evening | VAGINAL | 0 refills | Status: DC
Start: 2020-09-28 — End: 2022-03-05
  Filled 2020-10-01: qty 70, 5d supply, fill #0

## 2020-10-07 ENCOUNTER — Other Ambulatory Visit (HOSPITAL_COMMUNITY): Payer: Self-pay | Admitting: Adult Health

## 2020-10-07 DIAGNOSIS — I5022 Chronic systolic (congestive) heart failure: Secondary | ICD-10-CM

## 2020-10-08 ENCOUNTER — Other Ambulatory Visit (HOSPITAL_COMMUNITY): Payer: Self-pay | Admitting: Adult Health

## 2020-10-08 ENCOUNTER — Encounter: Payer: Self-pay | Admitting: Family Medicine

## 2020-10-08 DIAGNOSIS — I5022 Chronic systolic (congestive) heart failure: Secondary | ICD-10-CM

## 2020-10-08 MED ORDER — SPIRONOLACTONE 25 MG PO TABS: 25.0000 mg | ORAL_TABLET | Freq: Every day | ORAL | 2 refills | Status: DC

## 2020-10-08 MED ORDER — CARVEDILOL 3.125 MG PO TABS: 3.1250 mg | ORAL_TABLET | Freq: Two times a day (BID) | ORAL | 2 refills | Status: DC

## 2020-10-09 ENCOUNTER — Telehealth (HOSPITAL_COMMUNITY): Payer: Self-pay | Admitting: Cardiology

## 2020-10-10 ENCOUNTER — Other Ambulatory Visit (HOSPITAL_COMMUNITY): Payer: Self-pay | Admitting: Adult Health

## 2020-10-10 DIAGNOSIS — I5022 Chronic systolic (congestive) heart failure: Secondary | ICD-10-CM

## 2020-10-11 ENCOUNTER — Other Ambulatory Visit: Payer: Self-pay | Admitting: Family Medicine

## 2020-10-11 ENCOUNTER — Other Ambulatory Visit (HOSPITAL_COMMUNITY): Payer: Self-pay | Admitting: Adult Health

## 2020-10-11 DIAGNOSIS — I5022 Chronic systolic (congestive) heart failure: Secondary | ICD-10-CM

## 2020-10-16 NOTE — Telephone Encounter (Signed)
Ok to refill 

## 2020-10-28 NOTE — Progress Notes (Signed)
Advanced Heart Failure Clinic Note    PCP: Salley Scarlet, MD PCP-Cardiologist: Dr. Shirlee Latch  HPI:  Ms Sollers is a 29 year old female G1P1. Pregnancy was c/b by preeclampsia and gestational diabetes. She presented to [facility] at [redacted] weeks pregnant w/ SOB and dizziness on 08/11/19 and was admitted for IOL for preeclampsia.Delivered and health girl.Post delivery course was uncomplicated. She was discharged home on nifedipinefor furtherBP management. Nifedipine ultimately discontinued at post partum f/u by OB.  She presented to the [facility] ED 10/20 w/ CC of dyspneaand palpitations. ECHO was completed and showed reduced EF at 20%. HIV negative. No FH of cardiomyopathy or premature coronary disease.  No baseline coronary risk factors and no h/o chest pain. Slight hs-TnI elevation (18=>21) consistent with CHF. Not c/w SCAD and no clear RWMAs on echo. TSH normal. 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 She ws diuresed w/ IV Lasix w/ symptomatic improvement and improvement in volume. Diuresed to euvolemic state and placed on guidelines medical therapy. Initially placed on Losartan then transitioned to [facility]. Also treated w/ spironolactone and digoxin. Placed on daily PO lasix 40 mg daily. Pt counsled on the need for contraception while on cardiac meds, particularly Entresto. Discharge wt 191 lb.   Today she returns for HF follow up.She has not been seen in > 1 year. Overall feeling fine. Intermittently tired and has had presyncope 2-3 days a week. Denies SOB/PND/Orthopnea. Appetite ok. No fever or chills. Weight at home has been stable. Taking all medications. Working full time at [employer]. She is rarely sexually active. Uses condom when she has sex and she does not want birth control. Lives alone with her 31 year old.   ECHO 03/2020 EF 20-25%  ECHO 09/2019 EF 20%    Past Medical History:  Diagnosis Date  . CHF (congestive heart failure) (HCC)   . Gestational diabetes mellitus (GDM)   . Pre-eclampsia      Current Outpatient Medications  Medication Sig Dispense Refill  . carvedilol (COREG) 3.125 MG tablet Take 1 tablet (3.125 mg total) by mouth 2 (two) times daily with a meal. 180 tablet 2  . dapagliflozin propanediol (FARXIGA) 10 MG TABS tablet Take 10 mg by mouth daily before breakfast. 30 tablet 7  . ENTRESTO 24-26 MG TAKE 1 TABLET BY MOUTH TWICE A DAY 60 tablet 10  . spironolactone (ALDACTONE) 25 MG tablet Take 1 tablet (25 mg total) by mouth daily. 90 tablet 2   No current facility-administered medications for this encounter.    Allergies  Allergen Reactions  . Penicillins Hives    Did it involve swelling of the face/tongue/throat, SOB, or low BP? Unknown Did it involve sudden or severe rash/hives, skin peeling, or any reaction on the inside of your mouth or nose? Unknown Did you need to seek medical attention at a hospital or doctor's office? Unknown When did it last happen?pt was a child If all above answers are "NO", may proceed with cephalosporin use.   . Cleocin [Clindamycin] Rash      Social History   Socioeconomic History  . Marital status: Single    Spouse name: Not on file  . Number of children: Not on file  . Years of education: Not on file  . Highest education level: Not on file  Occupational History  . Not on file  Tobacco Use  . Smoking status: Never Smoker  . Smokeless tobacco: Never Used  Vaping Use  . Vaping Use: Never used  Substance and Sexual Activity  .  Alcohol use: Not Currently  . Drug use: Not Currently  . Sexual activity: Yes  Other Topics Concern  . Not on file  Social History Narrative  . Not on file   Social Determinants of Health   Financial Resource Strain:   . Difficulty of Paying Living Expenses: Not on file  Food Insecurity:   . Worried About Programme researcher, broadcasting/film/video in the Last Year: Not on file  . Ran Out of Food in the Last Year: Not on file  Transportation Needs:   . Lack of Transportation (Medical): Not on file  .  Lack of Transportation (Non-Medical): Not on file  Physical Activity:   . Days of Exercise per Week: Not on file  . Minutes of Exercise per Session: Not on file  Stress:   . Feeling of Stress : Not on file  Social Connections:   . Frequency of Communication with Friends and Family: Not on file  . Frequency of Social Gatherings with Friends and Family: Not on file  . Attends Religious Services: Not on file  . Active Member of Clubs or Organizations: Not on file  . Attends Banker Meetings: Not on file  . Marital Status: Not on file  Intimate Partner Violence:   . Fear of Current or Ex-Partner: Not on file  . Emotionally Abused: Not on file  . Physically Abused: Not on file  . Sexually Abused: Not on file      Family History  Problem Relation Age of Onset  . Hypertension Mother   . Heart failure Father   . Hypertension Sister   . Hypertension Sister   . Hypertension Sister     Vitals:   10/29/20 1136  BP: 124/90  Pulse: 85  SpO2: 99%  Weight: 90.4 kg (199 lb 3.2 oz)   Wt Readings from Last 3 Encounters:  10/29/20 90.4 kg (199 lb 3.2 oz)  09/19/20 88.9 kg (196 lb)  07/29/20 86.2 kg (190 lb)     PHYSICAL EXAM: General:  Well appearing. No resp difficulty HEENT: normal Neck: supple. no JVD. Carotids 2+ bilat; no bruits. No lymphadenopathy or thryomegaly appreciated. Cor: PMI nondisplaced. Regular rate & rhythm. No rubs, gallops or murmurs. Lungs: clear Abdomen: soft, nontender, nondistended. No hepatosplenomegaly. No bruits or masses. Good bowel sounds. Extremities: no cyanosis, clubbing, rash, edema Neuro: alert & orientedx3, cranial nerves grossly intact. moves all 4 extremities w/o difficulty. Affect pleasant  EKG: NSR 84 bpm    ASSESSMENT & PLAN:  1. 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 ECHO 10/20 w/ reduced EF 20% and diffuse hypokinesis.  RFs include African-American, gestational diabetes, and pre-eclampsia. She is HIV negative. No FH  of cardiomyopathy or premature coronary disease. Dad has heart failure.  TSH normal. -Echo 03/2020 EF 20-25%.  Over the next few months continue to adjust HF meds.  -NYHA II. Volume status stable. Does not need diuretics.  -Increase entresto 49-51 mg twice a day. Check BMET in 7 sday -Continue farxiga 10 mg daily.  -Continue 25 mg spiro daily.  -Continue Coreg 3.125 mg bid  -She is not currently sexually active and understands risk of fetal toxicity w/ HF meds. Plans to discuss contraception at next ob gyn visit.  - Plan to repeat ECHO in after HF meds optimized. If EF still down will need to refer to EP.   2. Palpitations Presyncope  EKG today  Place Zio Patch.  Follow up in 4 weeks. Consider increasing carvedilol at that time. Greater than  50% of the (total minutes 25) visit spent in counseling/coordination of care regarding the above. Purpose of medications and zio patch.    Tonye Becket, NP 10/29/20

## 2020-10-29 ENCOUNTER — Ambulatory Visit (HOSPITAL_COMMUNITY)
Admission: RE | Admit: 2020-10-29 | Discharge: 2020-10-29 | Disposition: A | Discharge status: Home / Self Care | Payer: No Typology Code available for payment source | Source: Ambulatory Visit | Attending: Adult Health | Admitting: Adult Health

## 2020-10-29 ENCOUNTER — Encounter (HOSPITAL_COMMUNITY): Payer: Self-pay

## 2020-10-29 ENCOUNTER — Other Ambulatory Visit: Payer: Self-pay

## 2020-10-29 VITALS — BP 124/90 | HR 85 | Wt 199.2 lb

## 2020-10-29 DIAGNOSIS — R002 Palpitations: Secondary | ICD-10-CM | POA: Diagnosis not present

## 2020-10-29 DIAGNOSIS — Z8759 Personal history of other complications of pregnancy, childbirth and the puerperium: Secondary | ICD-10-CM | POA: Diagnosis not present

## 2020-10-29 DIAGNOSIS — I5022 Chronic systolic (congestive) heart failure: Secondary | ICD-10-CM

## 2020-10-29 DIAGNOSIS — O903 Peripartum cardiomyopathy: Secondary | ICD-10-CM | POA: Diagnosis not present

## 2020-10-29 DIAGNOSIS — Z79899 Other long term (current) drug therapy: Secondary | ICD-10-CM | POA: Diagnosis not present

## 2020-10-29 DIAGNOSIS — Z880 Allergy status to penicillin: Secondary | ICD-10-CM | POA: Diagnosis not present

## 2020-10-29 DIAGNOSIS — Z8249 Family history of ischemic heart disease and other diseases of the circulatory system: Secondary | ICD-10-CM | POA: Insufficient documentation

## 2020-10-29 DIAGNOSIS — Z881 Allergy status to other antibiotic agents status: Secondary | ICD-10-CM | POA: Insufficient documentation

## 2020-10-29 MED ORDER — ENTRESTO 49-51 MG PO TABS
1.0000 | ORAL_TABLET | Freq: Two times a day (BID) | ORAL | 3 refills | Status: DC
Start: 2020-10-29 — End: 2021-01-14

## 2020-10-29 NOTE — Patient Instructions (Signed)
Increase Entresto to 49/51 mg( 1 tablet)  2 times a day.  Your physician recommends that you return for lab work in: 1 week.  Your provider has recommended that  you wear a Zio Patch for 7 days.  This monitor will record your heart rhythm for our review.  IF you have any symptoms while wearing the monitor please press the button.  If you have any issues with the patch or you notice a red or orange light on it please call the company at [phone].  Once you remove the patch please mail it back to the company as soon as possible so we can get the results.  Your physician recommends that you schedule a follow-up appointment in: 4 weeks.  If you have any questions or concerns before your next appointment please send [place] a message through [place] or call our office at [phone].    TO LEAVE A MESSAGE FOR THE NURSE SELECT OPTION 2, PLEASE LEAVE A MESSAGE INCLUDING: . YOUR NAME . DATE OF BIRTH . CALL BACK NUMBER . REASON FOR CALL**this is important as we prioritize the call backs  YOU WILL RECEIVE A CALL BACK THE SAME DAY AS LONG AS YOU CALL BEFORE 4:00 PM  At the [facility] Clinic, you and your health needs are our priority. As part of our continuing mission to provide you with exceptional heart care, we have created designated Provider Care Teams. These Care Teams include your primary Cardiologist (physician) and Advanced Practice Providers (APPs- Physician Assistants and Nurse Practitioners) who all work together to provide you with the care you need, when you need it.   You may see any of the following providers on your designated Care Team at your next follow up: Marland Kitchen Dr Arvilla Meres . Dr Marca Ancona . Tonye Becket, NP . Robbie Lis, PA . Karle Plumber, PharmD   Please be sure to bring in all your medications bottles to every appointment.

## 2020-10-29 NOTE — Progress Notes (Signed)
Zio patch placed onto patient.  All instructions and information reviewed with patient, they verbalize understanding with no questions. 

## 2020-10-29 NOTE — Addendum Note (Signed)
Encounter addended by: Suezanne Cheshire, RN on: 10/29/2020 4:37 PM  Actions taken: Clinical Note Signed

## 2020-11-05 ENCOUNTER — Ambulatory Visit (HOSPITAL_COMMUNITY)
Admission: RE | Admit: 2020-11-05 | Discharge: 2020-11-05 | Disposition: A | Discharge status: Home / Self Care | Payer: No Typology Code available for payment source | Source: Ambulatory Visit | Attending: Internal Medicine | Admitting: Internal Medicine

## 2020-11-05 ENCOUNTER — Other Ambulatory Visit: Payer: Self-pay

## 2020-11-05 DIAGNOSIS — I5022 Chronic systolic (congestive) heart failure: Secondary | ICD-10-CM

## 2020-11-05 LAB — BASIC METABOLIC PANEL
Anion gap: 9 (ref 5–15)
BUN: 12 mg/dL (ref 6–20)
CO2: 25 mmol/L (ref 22–32)
Calcium: 9.6 mg/dL (ref 8.9–10.3)
Chloride: 104 mmol/L (ref 98–111)
Creatinine, Ser: 0.78 mg/dL (ref 0.44–1.00)
GFR, Estimated: >60 mL/min (ref >60)
Glucose, Bld: 112 mg/dL — ABNORMAL HIGH (ref 70–99)
Potassium: 4.1 mmol/L (ref 3.5–5.1)
Sodium: 138 mmol/L (ref 135–145)

## 2020-11-23 ENCOUNTER — Telehealth (HOSPITAL_COMMUNITY): Payer: Self-pay

## 2020-11-23 NOTE — Telephone Encounter (Signed)
-----   Message from Sherald Hess, NP sent at 11/22/2020 11:45 AM EST ----- NSR no significant arrhythmias. Please call.

## 2020-11-23 NOTE — Telephone Encounter (Signed)
Samara Snide, RN  11/23/2020 2:35 PM EST Back to Top     Left message to return call. Letter mailed to address on file   Samara Snide, RN  11/23/2020 10:02 AM EST      Left message to return call   Samara Snide, RN  11/22/2020 12:31 PM EST      Left message to return call

## 2020-11-25 NOTE — Progress Notes (Signed)
Advanced Heart Failure Clinic Note    PCP: Salley Scarlet, MD PCP-Cardiologist: Dr. Shirlee Latch  HPI:  Robin Gallegos is a 29 year old female G1P1. Pregnancy was c/b by preeclampsia and gestational diabetes. She presented to [facility] at [redacted] weeks pregnant w/ SOB and dizziness on 08/11/19 and was admitted for IOL for preeclampsia.Delivered and health girl.Post delivery course was uncomplicated. She was discharged home on nifedipinefor furtherBP management. Nifedipine ultimately discontinued at post partum f/u by OB.  She presented to the [facility] ED 10/20 w/ CC of dyspneaand palpitations. ECHO was completed and showed reduced EF at 20%. HIV negative. No FH of cardiomyopathy or premature coronary disease.  No baseline coronary risk factors and no h/o chest pain. Slight hs-TnI elevation (18=>21) consistent with CHF. Not c/w SCAD and no clear RWMAs on echo. TSH normal. 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 She ws diuresed w/ IV Lasix w/ symptomatic improvement and improvement in volume. Diuresed to euvolemic state and placed on guidelines medical therapy. Initially placed on Losartan then transitioned to [facility]. Also treated w/ spironolactone and digoxin. Placed on daily PO lasix 40 mg daily. Pt counsled on the need for contraception while on cardiac meds, particularly Entresto. Discharge wt 191 lb.   Today she returns for HF follow up.Last visit entresto was increased to 49-51mg  twice a day. Overall feeling fine. Occasionally short of breath if she is walking briskly. Denies PND/Orthopnea. Appetite ok. No fever or chills. Weight at home around 200 pounds. Taking all medications. She is sexually active and using condoms to prevent pregnancy. Works full time at [employer].   ECHO 03/2020 EF 20-25%  ECHO 09/2019 EF 20%    Past Medical History:  Diagnosis Date  . CHF (congestive heart failure) (HCC)   . Gestational diabetes mellitus (GDM)   . Pre-eclampsia     Current Outpatient Medications  Medication Sig  Dispense Refill  . carvedilol (COREG) 3.125 MG tablet Take 1 tablet (3.125 mg total) by mouth 2 (two) times daily with a meal. 180 tablet 2  . dapagliflozin propanediol (FARXIGA) 10 MG TABS tablet Take 10 mg by mouth daily before breakfast. 30 tablet 7  . sacubitril-valsartan (ENTRESTO) 49-51 MG Take 1 tablet by mouth 2 (two) times daily. 60 tablet 3  . spironolactone (ALDACTONE) 25 MG tablet Take 1 tablet (25 mg total) by mouth daily. 90 tablet 2   No current facility-administered medications for this encounter.    Allergies  Allergen Reactions  . Penicillins Hives    Did it involve swelling of the face/tongue/throat, SOB, or low BP? Unknown Did it involve sudden or severe rash/hives, skin peeling, or any reaction on the inside of your mouth or nose? Unknown Did you need to seek medical attention at a hospital or doctor's office? Unknown When did it last happen?pt was a child If all above answers are "NO", may proceed with cephalosporin use.   . Cleocin [Clindamycin] Rash      Social History   Socioeconomic History  . Marital status: Single    Spouse name: Not on file  . Number of children: Not on file  . Years of education: Not on file  . Highest education level: Not on file  Occupational History  . Not on file  Tobacco Use  . Smoking status: Never Smoker  . Smokeless tobacco: Never Used  Vaping Use  . Vaping Use: Never used  Substance and Sexual Activity  . Alcohol use: Not Currently  . Drug use: Not Currently  .  Sexual activity: Yes  Other Topics Concern  . Not on file  Social History Narrative  . Not on file   Social Determinants of Health   Financial Resource Strain: Not on file  Food Insecurity: Not on file  Transportation Needs: Not on file  Physical Activity: Not on file  Stress: Not on file  Social Connections: Not on file  Intimate Partner Violence: Not on file      Family History  Problem Relation Age of Onset  . Hypertension Mother   .  Heart failure Father   . Hypertension Sister   . Hypertension Sister   . Hypertension Sister     Vitals:   11/26/20 0914  BP: 111/76  Pulse: 90  SpO2: 98%  Weight: 91.1 kg (200 lb 12.8 oz)   Wt Readings from Last 3 Encounters:  11/26/20 91.1 kg (200 lb 12.8 oz)  10/29/20 90.4 kg (199 lb 3.2 oz)  09/19/20 88.9 kg (196 lb)     PHYSICAL EXAM: General:  Well appearing. No resp difficulty HEENT: normal Neck: supple. no JVD. Carotids 2+ bilat; no bruits. No lymphadenopathy or thryomegaly appreciated. Cor: PMI nondisplaced. Regular rate & rhythm. No rubs, gallops or murmurs. Lungs: clear Abdomen: soft, nontender, nondistended. No hepatosplenomegaly. No bruits or masses. Good bowel sounds. Extremities: no cyanosis, clubbing, rash, edema Neuro: alert & orientedx3, cranial nerves grossly intact. moves all 4 extremities w/o difficulty. Affect pleasant  ASSESSMENT & PLAN:  1. 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 ECHO 10/20 w/ reduced EF 20% and diffuse hypokinesis.  RFs include African-American, gestational diabetes, and pre-eclampsia. She is HIV negative. No FH of cardiomyopathy or premature coronary disease. Dad has heart failure.  TSH normal. -Echo 03/2020 EF 20-25%.  Repeat ECHO next visit. . If EF still down will need to refer to EP.  NYHA II. Volume status stable.  -Continue entresto 49-51 mg twice a day. -Continue farxiga 10 mg daily.  -Continue 25 mg spiro daily.  -Increase Coreg 6.25 mg bid . Discussed medication changes.  -Recent lab work stable. -She is currently sexually active and understands risk of fetal toxicity w/ HF meds. Currently using condoms to prevent pregnancy. Plans to discuss contraception at next ob gyn visit.  2. Palpitations Presyncope  11/2020 Zio Patch NSR reported. Discussed results today.     Set up ECHO in 6 weeks with Dr Shirlee Latch. Greater than 50% of the (total minutes 25) visit spent in counseling/coordination of care regarding the above.    Tonye Becket, NP 11/26/20

## 2020-11-26 ENCOUNTER — Ambulatory Visit (HOSPITAL_COMMUNITY)
Admission: RE | Admit: 2020-11-26 | Discharge: 2020-11-26 | Disposition: A | Discharge status: Home / Self Care | Payer: No Typology Code available for payment source | Source: Ambulatory Visit | Attending: Adult Health | Admitting: Adult Health

## 2020-11-26 ENCOUNTER — Other Ambulatory Visit: Payer: Self-pay

## 2020-11-26 VITALS — BP 111/76 | HR 90 | Wt 200.8 lb

## 2020-11-26 DIAGNOSIS — I428 Other cardiomyopathies: Secondary | ICD-10-CM | POA: Diagnosis not present

## 2020-11-26 DIAGNOSIS — Z79899 Other long term (current) drug therapy: Secondary | ICD-10-CM | POA: Insufficient documentation

## 2020-11-26 DIAGNOSIS — R55 Syncope and collapse: Secondary | ICD-10-CM | POA: Diagnosis not present

## 2020-11-26 DIAGNOSIS — I5022 Chronic systolic (congestive) heart failure: Secondary | ICD-10-CM

## 2020-11-26 DIAGNOSIS — Z8249 Family history of ischemic heart disease and other diseases of the circulatory system: Secondary | ICD-10-CM | POA: Diagnosis not present

## 2020-11-26 DIAGNOSIS — O903 Peripartum cardiomyopathy: Secondary | ICD-10-CM | POA: Diagnosis not present

## 2020-11-26 DIAGNOSIS — R002 Palpitations: Secondary | ICD-10-CM | POA: Diagnosis not present

## 2020-11-26 MED ORDER — CARVEDILOL 6.25 MG PO TABS: 6.2500 mg | ORAL_TABLET | Freq: Two times a day (BID) | ORAL | 3 refills | Status: DC

## 2020-11-26 NOTE — Patient Instructions (Signed)
INCREASE Coreg to 6.25 mg, one tab twice a day  Your physician recommends that you schedule a follow-up appointment in: 6 weeks with Dr Shirlee Latch and echo  Your physician has requested that you have an echocardiogram. Echocardiography is a painless test that uses sound waves to create images of your heart. It provides your doctor with information about the size and shape of your heart and how well your heart's chambers and valves are working. This procedure takes approximately one hour. There are no restrictions for this procedure.   If you have any questions or concerns before your next appointment please send [place] a message through [place] or call our office at [phone].    TO LEAVE A MESSAGE FOR THE NURSE SELECT OPTION 2, PLEASE LEAVE A MESSAGE INCLUDING: . YOUR NAME . DATE OF BIRTH . CALL BACK NUMBER . REASON FOR CALL**this is important as we prioritize the call backs  YOU WILL RECEIVE A CALL BACK THE SAME DAY AS LONG AS YOU CALL BEFORE 4:00 PM

## 2021-01-03 ENCOUNTER — Telehealth: Payer: Self-pay

## 2021-01-03 NOTE — Telephone Encounter (Signed)
TOP Appointment Request  Date of LMP:12/03/20  Do you have insurance: yes  If yes, Type of Insurance: [employer] VW#:[id number]  SS# (if Medicaid plan):  Have you had an Ultrasound, if yes provide [facility] fax # [phone] to have copy faxed if not in e-record: no  What is the best phone number for a call back:[phone]  Is it OK to leave a voicemail: no        Spoke to [place] from [employer]

## 2021-01-03 NOTE — Telephone Encounter (Signed)
No PA required for [id number] outpt at [facility] with Dr Tama Headings

## 2021-01-08 ENCOUNTER — Emergency Department
Admission: EM | Admit: 2021-01-08 | Discharge: 2021-01-08 | Discharge status: Against Medical Advice | Payer: MEDICAID | Source: Ambulatory Visit | Attending: Emergency Medicine | Admitting: Emergency Medicine

## 2021-01-08 ENCOUNTER — Telehealth: Payer: Self-pay

## 2021-01-08 DIAGNOSIS — Z5321 Procedure and treatment not carried out due to patient leaving prior to being seen by health care provider: Secondary | ICD-10-CM | POA: Insufficient documentation

## 2021-01-08 LAB — CBC AND DIFFERENTIAL
Baso # K/uL: 0 10*3/uL (ref 0.0–0.1)
Basophil %: 0.3 %
Eos # K/uL: 0.2 10*3/uL (ref 0.0–0.4)
Eosinophil %: 2.4 %
Hematocrit: 37 % (ref 34–45)
Hemoglobin: 12.3 g/dL (ref 11.2–15.7)
IMM Granulocytes #: 0 10*3/uL (ref 0.0–0.0)
IMM Granulocytes: 0.3 %
Lymph # K/uL: 2.1 10*3/uL (ref 1.2–3.7)
Lymphocyte %: 32 %
MCH: 32 pg (ref 26–32)
MCHC: 33 g/dL (ref 32–36)
MCV: 95 fL (ref 79–95)
Mono # K/uL: 0.6 10*3/uL (ref 0.2–0.9)
Monocyte %: 9.5 %
Neut # K/uL: 3.7 10*3/uL (ref 1.6–6.1)
Nucl RBC # K/uL: 0 10*3/uL (ref 0.0–0.0)
Nucl RBC %: 0 /100 WBC (ref 0.0–0.2)
Platelets: 259 10*3/uL (ref 160–370)
RBC: 3.9 MIL/uL (ref 3.9–5.2)
RDW: 12.6 % (ref 11.7–14.4)
Seg Neut %: 55.5 %
WBC: 6.6 10*3/uL (ref 4.0–10.0)

## 2021-01-08 LAB — BHCG, QUANT PREGNANCY: BHCG, QUANT PREGNANCY: 915 m[IU]/mL — ABNORMAL HIGH (ref 0–1)

## 2021-01-08 LAB — TYPE AND SCREEN

## 2021-01-08 MED ORDER — DEXTROSE 5 % FLUSH FOR PUMPS *I*
0.0000 mL/h | INTRAVENOUS | Status: DC | PRN
Start: 2021-01-08 — End: 2021-01-08

## 2021-01-08 MED ORDER — SODIUM CHLORIDE 0.9 % FLUSH FOR PUMPS *I*
0.0000 mL/h | INTRAVENOUS | Status: DC | PRN
Start: 2021-01-08 — End: 2021-01-08

## 2021-01-08 NOTE — Telephone Encounter (Addendum)
Date:01/08/21;  Time: 2:10pm  Recorded By: NP  TOP SERVICE  TELEPHONE INTAKE    Patient's Name: Madison Rose   Patient Age: 30 y.o.   Patient's Address:  [address]  [place] [address]        Current county? [place] and state of BIRTH: [place]  Birth last name/maiden name if different than current last name? Tro[place]   Last 4 digits of social? 6  Race: Black  Hispanic Origin  No  Highest level of education? GED    CONTACT INFO:  Phone:    (c) number: [phone]  Message OK? Yes                [facility] Patient? No  Referring Provider?     Insurance: [employer]  Ins/Contract #: [id number]  Ins Verified: 01/03/21    LMP: 12/03/20  EGA: [redacted]W[redacted]D on 01/28/21  No LMP recorded.   BMI: 35.9    1st Trimester:  Yes  2nd Trimester:  No  Genetic Referral:  No    OB/GYN History: G 5  P 1  OB History    No obstetric history on file.        Previous Vaginal deliveries? 0  Previous C/S? 1  Previous TOP?: 2  Previous SAb? 1  Date of first live birth:  10[birth year]2008  Date of last live birth: n/a  Date of last termination: 12/2018    Significant Medical History? None  No past medical history on file.   Significant Surgical Hx  No past surgical history on file.    Current drug use? None    Not a candidate for procedure room if:    ? BMI greater than 40  ? Active street drug use  ? Current suboxone/subutex/methadone use    Needs to be reviewed with provider prior to scheduling if:  ? Cardiac/Pulmonary issues  ? Diabetes on insulin  ? Severe psychiatric disease  ? BMI greater than 50    Allergies: See below  Allergies   Allergen Reactions    Sulfur Hives    Clindamycin Other (See Comments)     unknown    Clindamycin/Lincomycin Other (See Comments)     unknown        Current Meds: None    Where was your pregnancy diagnosed? HPT  Have you been seen anywhere else for your pregnancy? no   Have you been seen in a hospital, urgent care or emergency room for your pregnancy? no  Have you received care at any of the following clinics:   Community  OBGYN no  [facility] no  [facility] no  [facility] no  [facility] no  Other Private Doctor including Family Medicine, Internal Medicine, or Pediatrics no    Have you had an Ultrasound for this pregnancy? no  If so, where?   What did they tell you about the Ultrasound?     Date of ultrasound:     1st Trimester Appt Scheduled for: 01/24/21  2nd Trimester Appt Scheduled for:    OR Date: 01/28/21    Reviewed transportation policy: Yes  Reviewed NPO for same day procedures: N/A  Reviewed dilator placement (GA-16 weeks and over): N/A    COVID testing  Testing discussed Yes  Date patient plans to go for testing: at Pre-Op appointment  Location patient plans to go for testing: [address]    Postop Appt/Location: Will only make if necessary.

## 2021-01-08 NOTE — ED Triage Notes (Signed)
Pt states she had + home pregnancy test. LMP December 20th. Started having bleeding 2 days ago. Denies any clots/tissue. Changing pad twice a day. C/o Bilateral pelvic pain and lower back pain.

## 2021-01-08 NOTE — Telephone Encounter (Signed)
Spoke with patient, patient is scheduled for Pre-Op D&S on 01/24/21 at 10am, procedure on 01/28/21 AM. Patient is aware of visitor policy, covid testing and to have a driver for procedure.

## 2021-01-14 ENCOUNTER — Ambulatory Visit (HOSPITAL_COMMUNITY)
Admission: RE | Admit: 2021-01-14 | Discharge: 2021-01-14 | Disposition: A | Discharge status: Home / Self Care | Payer: No Typology Code available for payment source | Source: Ambulatory Visit | Attending: Family Medicine | Admitting: Family Medicine

## 2021-01-14 ENCOUNTER — Other Ambulatory Visit: Payer: Self-pay

## 2021-01-14 ENCOUNTER — Ambulatory Visit (HOSPITAL_BASED_OUTPATIENT_CLINIC_OR_DEPARTMENT_OTHER)
Admission: RE | Admit: 2021-01-14 | Discharge: 2021-01-14 | Disposition: A | Discharge status: Home / Self Care | Payer: No Typology Code available for payment source | Source: Ambulatory Visit | Attending: Cardiology | Admitting: Cardiology

## 2021-01-14 ENCOUNTER — Encounter (HOSPITAL_COMMUNITY): Payer: Self-pay | Admitting: Cardiology

## 2021-01-14 DIAGNOSIS — I5022 Chronic systolic (congestive) heart failure: Secondary | ICD-10-CM | POA: Diagnosis not present

## 2021-01-14 DIAGNOSIS — I429 Cardiomyopathy, unspecified: Secondary | ICD-10-CM | POA: Diagnosis not present

## 2021-01-14 DIAGNOSIS — E119 Type 2 diabetes mellitus without complications: Secondary | ICD-10-CM | POA: Diagnosis not present

## 2021-01-14 LAB — BASIC METABOLIC PANEL
Anion gap: 8 (ref 5–15)
BUN: 12 mg/dL (ref 6–20)
CO2: 26 mmol/L (ref 22–32)
Calcium: 9.4 mg/dL (ref 8.9–10.3)
Chloride: 104 mmol/L (ref 98–111)
Creatinine, Ser: 0.89 mg/dL (ref 0.44–1.00)
GFR, Estimated: >60 mL/min (ref >60)
Glucose, Bld: 104 mg/dL — ABNORMAL HIGH (ref 70–99)
Potassium: 4.1 mmol/L (ref 3.5–5.1)
Sodium: 138 mmol/L (ref 135–145)

## 2021-01-14 LAB — ECHOCARDIOGRAM COMPLETE
Area-P 1/2: 5.84 cm2
Calc EF: 33.8 %
S' Lateral: 4.1 cm
Single Plane A2C EF: 35.6 %
Single Plane A4C EF: 34.7 %

## 2021-01-14 LAB — TSH: TSH: 0.557 u[IU]/mL (ref 0.350–4.500)

## 2021-01-14 LAB — BRAIN NATRIURETIC PEPTIDE: B Natriuretic Peptide: 42.6 pg/mL (ref 0.0–100.0)

## 2021-01-14 MED ORDER — CARVEDILOL 6.25 MG PO TABS: 9.3750 mg | ORAL_TABLET | Freq: Two times a day (BID) | ORAL | 3 refills | Status: DC

## 2021-01-14 MED ORDER — ENTRESTO 97-103 MG PO TABS
1.0000 | ORAL_TABLET | Freq: Two times a day (BID) | ORAL | 3 refills | Status: DC
Start: 2021-01-14 — End: 2023-10-06

## 2021-01-14 NOTE — Progress Notes (Signed)
[facility] Clinic Note    PCP: Salley Scarlet, MD PCP-Cardiologist: Dr. Shirlee Latch  HPI:  Ms Hitt is a 30 y.o. female G1P1. Pregnancy in 2020 was complicated by preeclampsia and gestational diabetes. She presented at [redacted] weeks pregnant w/ SOB and dizziness on 08/11/19 and was admitted for preeclampsia.Delivered a healthy girl.Post delivery course was uncomplicated. She was discharged home on nifedipinefor furtherBP management. Nifedipine ultimately discontinued at post-partum f/u by OB.  She presented to the [name] ED in 10/20 w/ dyspneaand palpitations.  Echo in 10/20 showed reduced EF at 20%. HIV negative. No FH of cardiomyopathy or premature coronary disease.  No baseline coronary risk factors and no h/o chest pain. Slight hs-TnI elevation (18=>21) consistent with CHF. Not c/w SCAD and no clear RWMAs on echo. TSH normal. 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 She ws diuresed w/ IV Lasix w/ symptomatic improvement and improvement in volume. Diuresed to euvolemic state and placed on guideline-directed medical therapy. Started on daily PO lasix 40 mg daily. Pt counseled on the need for contraception while on cardiac meds, particularly Entresto. Discharge wt 191 lb.   Echo was done today and reviewed, EF 35-40% with diffuse hypokinesis, normal RV size and systolic function, PASP 22, normal IVC.   She returns for followup of CHF.  She is working as a Merchandiser, retail at [employer]. She uses contraception and understands the risks of pregnancy.  She is doing well symptomatically, no significant exertional dyspnea or chest pain. No lightheadedness or palpitations.  No orthopnea/PND.  She is taking all her meds.   Labs (11/21): K 4.1, creatinine 0.78  ECG (11/21, personally reviewed): NSR, nonspecific T wave inversions, narrow QRS  PMH: 1. 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   - Echo (10/20): EF 20%.  - Echo (4/21): EF 20-25%   - Echo (1/22): EF 35-40%, diffuse hypokinesis, normal RV, PASP 22, IVC normal.  2. Pre-eclampsia 3. Zio patch 12/21 with no significant arrhythmias.    Current Outpatient Medications  Medication Sig Dispense Refill  . dapagliflozin propanediol (FARXIGA) 10 MG TABS tablet Take 10 mg by mouth daily before breakfast. 30 tablet 7  . sacubitril-valsartan (ENTRESTO) 97-103 MG Take 1 tablet by mouth 2 (two) times daily. 180 tablet 3  . spironolactone (ALDACTONE) 25 MG tablet Take 1 tablet (25 mg total) by mouth daily. 90 tablet 2  . carvedilol (COREG) 6.25 MG tablet Take 1.5 tablets (9.375 mg total) by mouth 2 (two) times daily with a meal. 180 tablet 3   No current facility-administered medications for this encounter.    Allergies  Allergen Reactions  . Penicillins Hives    Did it involve swelling of the face/tongue/throat, SOB, or low BP? Unknown Did it involve sudden or severe rash/hives, skin peeling, or any reaction on the inside of your mouth or nose? Unknown Did you need to seek medical attention at a hospital or doctor's office? Unknown When did it last happen?pt was a child If all above answers are "NO", may proceed with cephalosporin use.   . Cleocin [Clindamycin] Rash      Social History   Socioeconomic History  . Marital status: Single    Spouse name: Not on file  . Number of children: Not on file  . Years of education: Not on file  . Highest education level: Not on file  Occupational History  . Not on file  Tobacco Use  . Smoking status: Never Smoker  . Smokeless tobacco: Never Used  Vaping Use  . Vaping Use: Never used  Substance and Sexual Activity  . Alcohol use: Not Currently  . Drug use: Not Currently  . Sexual activity: Yes  Other Topics Concern  . Not on file  Social History Narrative  . Not on file   Social Determinants of Health   Financial Resource Strain: Not on file  Food Insecurity: Not on file  Transportation Needs: Not on file   Physical Activity: Not on file  Stress: Not on file  Social Connections: Not on file  Intimate Partner Violence: Not on file      Family History  Problem Relation Age of Onset  . Hypertension Mother   . Heart failure Father   . Hypertension Sister   . Hypertension Sister   . Hypertension Sister     Vitals:   01/14/21 1547  BP: 128/82  Pulse: 90  SpO2: 100%  Weight: 92.1 kg (203 lb)   Wt Readings from Last 3 Encounters:  01/14/21 92.1 kg (203 lb)  11/26/20 91.1 kg (200 lb 12.8 oz)  10/29/20 90.4 kg (199 lb 3.2 oz)    PHYSICAL EXAM: General: NAD Neck: No JVD, no thyromegaly or thyroid nodule.  Lungs: Clear to auscultation bilaterally with normal respiratory effort. CV: Nondisplaced PMI.  Heart regular S1/S2, no S3/S4, no murmur.  No peripheral edema.  No carotid bruit.  Normal pedal pulses.  Abdomen: Soft, nontender, no hepatosplenomegaly, no distention.  Skin: Intact without lesions or rashes.  Neurologic: Alert and oriented x 3.  Psych: Normal affect. Extremities: No clubbing or cyanosis.  HEENT: Normal.   ASSESSMENT & PLAN:  1. 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 Echo in 10/20 with EF 20% and diffuse hypokinesis.  RFs include African-American, gestational diabetes, and pre-eclampsia. She was HIV negative. Father has CHF, she is not certain about details.  She does not drink ETOH or use drugs. No chest pain, CAD is unlikely.  TSH has been normal.  Echo today showed EF up to 35-40% with normal RV.  She is not volume overloaded on exam. NYHA class I-II.  - I will arrange for cardiac MRI to more closely quantify EF (?ICD, not CRT candidate with narrow QRS) and also to look for infiltrative disease, myocarditis, or signs of prior MI (unlikely).  - Increase Entresto to 97/103 bid. BMET today and in 10 days.  - Continue Farxiga 10 mg daily.  - Continue spironolactone 25 mg daily.   - Increase Coreg to 9.375 mg bid.   - She understands risk of  fetal toxicity w/ HF meds and also the risk of worsening HF if she were to get pregnant again.  She is using contraception.  2. Palpitations/Presyncope: 11/2020 Zio Patch showed no significant arrhythmias.   Followup HF pharmacist in 3 weeks x 2 visits for medication titration, see me in 3 months.   Marca Ancona, MD 01/14/21

## 2021-01-14 NOTE — Progress Notes (Signed)
  Echocardiogram 2D Echocardiogram has been performed.  Robin Gallegos 01/14/2021, 3:21 PM

## 2021-01-14 NOTE — Patient Instructions (Addendum)
Labs done today. We will contact you only if your labs are abnormal.  INCREASE Entresto to 97-103mg (1 tablet) by mouth 2 times daily.   INCREASE Carvedilol to 9.375mg (1 & 1/2 tablets) by mouth 2 times daily.  No other medication changes were made. Please continue all current medications as prescribed.  Your physician recommends that you schedule a follow-up appointment in: 10 days for a lab only appointment,3 weeks for an appointment with our Clinic Pharmacist, Lauren and in 3 months for an appointment with Dr. Shirlee Latch  Your physician has requested that you have a cardiac MRI. Cardiac MRI uses a computer to create images of your heart as its beating, producing both still and moving pictures of your heart and major blood vessels. For further information please visit [email or web address]. This has to be approved through your insurance prior to scheduling, once approved we will contact you to schedule an appointment.  If you have any questions or concerns before your next appointment please send [place] a message through [place] or call our office at [phone].    TO LEAVE A MESSAGE FOR THE NURSE SELECT OPTION 2, PLEASE LEAVE A MESSAGE INCLUDING: . YOUR NAME . DATE OF BIRTH . CALL BACK NUMBER . REASON FOR CALL**this is important as we prioritize the call backs  YOU WILL RECEIVE A CALL BACK THE SAME DAY AS LONG AS YOU CALL BEFORE 4:00 PM   Do the following things EVERYDAY: 1) Weigh yourself in the morning before breakfast. Write it down and keep it in a log. 2) Take your medicines as prescribed 3) Eat low salt foods--Limit salt (sodium) to 2000 mg per day.  4) Stay as active as you can everyday 5) Limit all fluids for the day to less than 2 liters   At the [facility], you and your health needs are our priority. As part of our continuing mission to provide you with exceptional heart care, we have created designated Provider Care Teams. These Care Teams include your primary  Cardiologist (physician) and Advanced Practice Providers (APPs- Physician Assistants and Nurse Practitioners) who all work together to provide you with the care you need, when you need it.   You may see any of the following providers on your designated Care Team at your next follow up: Marland Kitchen Dr Arvilla Meres . Dr Marca Ancona . Tonye Becket, NP . Robbie Lis, PA . Karle Plumber, PharmD   Please be sure to bring in all your medications bottles to every appointment.

## 2021-01-15 LAB — UNMAPPED LAB RESULTS
ABO RH Blood Type (HT): O POS
Antibody Screen (HT): NEGATIVE
Basophil # (HT): 0 10 3/uL (ref 0.0–0.2)
Basophil % (HT): 0 % (ref 0–3)
Eosinophil # (HT): 0.1 10 3/uL (ref 0.0–0.6)
Eosinophil % (HT): 2 % (ref 0–5)
Hematocrit (HT): 32 % — ABNORMAL LOW (ref 35–47)
Hemoglobin (HGB) (HT): 10.9 g/dL — ABNORMAL LOW (ref 12.0–16.0)
Lymphocyte # (HT): 2.2 10 3/uL (ref 1.0–4.8)
Lymphocyte % (HT): 41 % (ref 15–45)
MCHC (HT): 33.9 g/dL (ref 31.0–37.5)
MCV (HT): 94 fL (ref 80–100)
Mean Corpuscular Hemoglobin (MCH) (HT): 32 pg (ref 26.0–34.0)
Monocyte # (HT): 0.6 10 3/uL (ref 0.1–1.0)
Monocyte % (HT): 10 % (ref 0–15)
Neutrophil # (HT): 2.5 10 3/uL (ref 1.8–8.0)
Platelets (HT): 257 10 3/uL (ref 150–450)
RBC (HT): 3.41 10 6/uL — ABNORMAL LOW (ref 3.80–5.20)
RDW (HT): 12.4 % (ref 0.0–15.2)
Seg Neut % (HT): 45 % (ref 45–75)
WBC (HT): 5.4 10 3/uL (ref 4.0–11.0)

## 2021-01-16 NOTE — Consults (Signed)
 -------------------------------------------------------------------------------  Attestation signed by Wendie Agreste, MD at 01/16/2021  6:52 AM  I reviewed and discussed the case of the patient with the resident, GOWDA, TEJASVI (Resident), including the resident's findings in history, physical examination and the diagnosis and treatment plan at the time of today's visit.  I provided a personal direction in the services rendered at this visit, and concur with the findings, diagnosis and plans as documented in the residents note.  Additional comments:None  Wendie Agreste, MD          -------------------------------------------------------------------------------    Gynecology Consult Note      Chief Complaint : vaginal bleeding in early pregnancy     Subjective:  Ms Madison Rose is a 30 y.o. G3 P1-0-1-1 at 6 weeks 6 days by LMP Rh+ presenting with the above chief complaint    Patient says that she has been having vaginal bleeding for the past few days.  Initially was very light spotting, however yesterday morning began getting heavier, enough where she needed to wear a pad, however not enough to soak through any pads.  She is having mild lower abdominal cramping.  She denies any severe abdominal pain, nausea or vomiting.  Patient reports that this is a planned pregnancy.    OB history:  -History of PLTCS for nonreassuring fetal heart tracing  -History of SAB x1  -History of recent abdominoplasty around 9 months ago    Obstetric History  OB History   No obstetric history on file.         Past Medical History:   Diagnosis Date   . Type O blood, Rh positive      Social History     Socioeconomic History   . Marital status: Single     Spouse name: None   . Number of children: None   . Years of education: None   . Highest education level: None   Occupational History   . None   Tobacco Use   . Smoking status: Former Smoker     Types: Cigarettes   Substance and Sexual Activity   . Alcohol use: Yes   . Drug use: No   . Sexual  activity: None   Other Topics Concern   . None   Social History Narrative   . None     Social Determinants of Health     Financial Resource Strain: Not on file   Food Insecurity: Not on file   Transportation Needs: Not on file   Physical Activity: Not on file   Stress: Not on file   Social Connections: Not on file   Intimate Partner Violence: Not on file     Family History   Problem Relation Age of Onset   .  () Unknown          Allergies   Allergen Reactions   . Clindamycin    . Sulfur Hives         Review of Systems  Review of Systems   Constitutional: Negative for chills and fever.   HENT: Negative.    Eyes: Negative for blurred vision.   Respiratory: Negative for shortness of breath.    Cardiovascular: Negative for chest pain.   Gastrointestinal: Negative for abdominal pain, nausea and vomiting.   Genitourinary: Negative.    Musculoskeletal: Negative.    Skin: Negative.    Neurological: Negative for dizziness and headaches.   Psychiatric/Behavioral: Negative.         Objective:     Vitals:  01/15/21 1717   BP: 115/69   Pulse: 88   Resp: 18   Temp: 36.7 C (98.1 F)     Physical Exam   Constitutional: She is oriented to person, place, and time. No distress.   Cardiovascular: Normal rate and regular rhythm.    Pulmonary/Chest: Effort normal.   Abdominal: Soft.   Musculoskeletal: Normal range of motion.   Neurological: She is alert and oriented to person, place, and time.   Skin: Skin is warm and dry.   Psychiatric: She has a normal mood and affect. Her behavior is normal.     Pelvic-normal external genitalia, vaginal mucosa, open cervix with blood present in the vaginal vault    Results for orders placed or performed during the hospital encounter of 01/15/21 (from the past 24 hour(s))   CBC and differential    Collection Time: 01/15/21  5:47 PM   Result Value    WBC 5.4    RBC 3.41 (L)    HGB 10.9 (L)    HCT 32 (L)    MCV 94    MCH 32.0    MCHC 33.9    RDW 12.4    PLATELET COUNT 257    NEUTROPHILS 45     LYMPHOCYTES 41    MONOCYTES 10    EOSINOPHILS 2    BASOPHILS 0    NEUTROPHIL # 2.5    LYMPHOCYTE # 2.2    MONOCYTE # 0.6    EOSINOPHIL # 0.1    BASOPHIL # 0.0    Narrative    Release to patient->Immediate  Unit Collect  Type->Clean CatchClean Catch  Unit Collect   CMP     Collection Time: 01/15/21  5:47 PM   Result Value    SODIUM 138    POTASSIUM 3.9    CHLORIDE 107    CO2 29    ANION GAP 2 (L)    BUN 7 (L)    CREATININE 0.8    GLUCOSE 79    CALCIUM 8.7    TOTAL PROTEIN 7.2    ALBUMIN 3.9    GLOBULIN 3.3    BILI, TOTAL 0.2 (L)    AST 16    ALT 15    ALK PHOS 59    Narrative    Release to patient->Immediate  Unit Collect  Type->Clean CatchClean Catch  Unit Collect   HCG, Quantitative    Collection Time: 01/15/21  5:47 PM   Result Value    HCG BETA 1551    Narrative    Release to patient->Immediate  Unit Collect  Type->Clean CatchClean Catch  Unit Collect   Differential    Collection Time: 01/15/21  5:47 PM   Result Value    DIFFERENTIAL AUTOMATED    Narrative    Release to patient->Immediate  Unit Collect  Type->Clean CatchClean Catch  Unit Collect   eGFR Caucasian    Collection Time: 01/15/21  5:47 PM   Result Value    EGFR CAUCASIAN >60    Narrative    Release to patient->Immediate  Unit Collect  Type->Clean CatchClean Catch  Unit Collect   eGFR Black    Collection Time: 01/15/21  5:47 PM   Result Value    EGFR BLACK >60    Narrative    Release to patient->Immediate  Unit Collect  Type->Clean CatchClean Catch  Unit Collect   Urinalysis W Reflex To Micro And Cul    Collection Time: 01/15/21  6:02 PM   Result Value  COLOR, UR RED (A)    APPEARANCE, UR TURBID (A)    GLUCOSE, UR NEG    KETONES, UR TRACE (A)    SPEC GRAVITY 1.020    BLOOD, UR LARGE (A)    PH, UR 7.0    PROTEIN, UR 100 (A)    NITRITES, UR POS (A)    LEUK ESTERASE, UR SMALL (A)    Narrative    Release to patient->Immediate  Unit Collect  Type->Clean CatchClean Catch  Unit Collect   URINE MICROSCOPIC    Collection Time: 01/15/21  6:02 PM   Result Value     RBC, UR >40 (A)    WBC, UR 0-9    BACTERIA, UR PRESENT (A)    Narrative    Release to patient->Immediate  Unit Collect  Type->Clean CatchClean Catch  Unit Collect   URINE MICROSCOPIC    Collection Time: 01/15/21  6:02 PM    Narrative    Release to patient->ImmediateUnit CollectType->Clean CatchClean CatchUnit Collect   URINE CULTURE    Collection Time: 01/15/21  6:02 PM    Specimen: RUR    Narrative    Release to patient->ImmediateUnit CollectType->Clean CatchClean CatchUnit Collect   Type and Screen    Collection Time: 01/15/21  6:51 PM   Result Value    ABO RH O Rh Positive    ANTIBODY SCREEN Negative    Narrative    Preg UNK  TxHx UNK  Unit Collect     Radiology:  US OB Less Than 14 Weeks Single Gestation  Addendum* ADDENDUM #1    No fetal pole or yolk sac is identified within the endometrial cavity,  which only demonstrates a small intrauterine fluid collection at this  time. Differential diagnostic possibilities include early gestational sac  versus a pseudogestational sac of an as   yet unidentified ectopic pregnancy. Follow-up with serial quantitative  beta-hCG and a short-term repeat ultrasound is suggested.     Signed by Attending: Mickey Farber, MD on 01/15/2021 10:47 PM   ORIGINAL REPORT    Indication:  Gestational age/dating; evaluate for viability. Beta hcg:  1551 on 01/15/2021     LMP:  Early December.     Comparison:  None.     Technique:  Transabdominal grayscale and color Doppler pelvic ultrasound  is performed in the usual fashion.     Findings:   Fetal Evaluation:   Number of Fetuses: 0   Pregnancy Location: Intrauterine   Gestational sac : n/a   Yolk Sac: n/a   Fetal heart rate on M-mode (bpm): n/a   Cardiac activity: Observed on clip store     Biometry:   Fetal pole/crown rump length (cm): n/a   Estimated gestational age by crown rump length: n/a   Estimated ultrasound date of delivery: n/a     Other:   The uterus measures 9.7 x 5.0 x 4.7 cm with normal echogenicity. The  endometrium measures 1.5  cm, and there is a 0.6 cm, avascular cystic  lesion within the lower endometrial canal.   Right ovary measures 4.0 x 2.4 x 2.9 cm with normal echogenicity. There is  a 2.1 cm right corpus luteum.   Left ovary measures 2.5 x 1.2 x 1.7 cm with normal echogenicity.   There is no free fluid. Survey views demonstrate no hydronephrosis.     Impression: Intrauterine pregnancy of uncertain viability, with a  borderline thickened endometrium and subcentimeter cystic lesion within  the lower endometrial canal, which may represent a gestational sac. No  fetal pole or yolk sac is identified at this   time. Obstetric/gynecology follow-up with short-term repeat  ultrasonography is advised.     Approved by: Marianne Sofia, MD on 01/15/2021 10:09 PM     The undersigned attending radiologist has personally reviewed the  examination and the resident's interpretation thereof, and agrees with the  findings.  Mickey Farber     Signed by Attending: Mickey Farber, MD on 01/15/2021 10:26 PM  Narrative: Indication:  Gestational age/dating; evaluate for viability. Beta hcg: 1551 on 01/15/2021    LMP:  Early December.    Comparison:  None.    Technique:  Transabdominal grayscale and color Doppler pelvic ultrasound is performed in the usual fashion.    Findings:  Fetal Evaluation:  Number of Fetuses: 0  Pregnancy Location: Intrauterine  Gestational sac : n/a  Yolk Sac: n/a  Fetal heart rate on M-mode (bpm): n/a  Cardiac activity: Observed on clip store    Biometry:  Fetal pole/crown rump length (cm): n/a  Estimated gestational age by crown rump length: n/a  Estimated ultrasound date of delivery: n/a    Other:  The uterus measures 9.7 x 5.0 x 4.7 cm with normal echogenicity. The endometrium measures 1.5 cm, and there is a 0.6 cm, avascular cystic lesion within the lower endometrial canal.  Right ovary measures 4.0 x 2.4 x 2.9 cm with normal echogenicity. There is a 2.1 cm right corpus luteum.  Left ovary measures 2.5 x 1.2 x 1.7 cm with normal  echogenicity.  There is no free fluid. Survey views demonstrate no hydronephrosis.    Impression: Intrauterine pregnancy of uncertain viability, with a borderline thickened endometrium and subcentimeter cystic lesion within the lower endometrial canal, which may represent a gestational sac. No fetal pole or yolk sac is identified at this  time. Obstetric/gynecology follow-up with short-term repeat ultrasonography is advised.    Approved by: Marianne Sofia, MD on 01/15/2021 10:09 PM    The undersigned attending radiologist has personally reviewed the examination and the resident's interpretation thereof, and agrees with the findings.  Mickey Farber    Signed by Attending: Mickey Farber, MD on 01/15/2021 10:26 PM        Assessment:     Ms Madison Rose is a 30 y.o. Rh+ G3 P1-0-1-1 at 6-week 6 days presenting with vaginal bleeding in the setting of pregnancy of unknown location     Plan:     -Beta hCG today is 1088.  Ultrasound demonstrates a small intrauterine fluid collection, which could possibly be consistent with the gestational sac.  However yolk sac/fetal pole not identified.  This ultrasound finding plus patient's bleeding is probably consistent with an spontaneous abortion.  However other differentials include ectopic, or early IUP.  Patient was extensively counseled on this.  -H/H is stable at 0.9/32.  Rh+.  -Patient was counseled on miscarriage and ectopic precautions.  Including severe abdominal pain, heavy vaginal bleeding soaking through more than 2 pads in an hour  -Patient will be followed on OB resident beta board.  She is to repeat a beta hCG on Thursday, February 3.  We will continue to trend betas, and get ultrasounds as indicated  -Confirmed patient's phone number as [phone]      Discussed with Dr. Heloise Beecham, MD  01/16/2021  12:47 AM

## 2021-01-17 ENCOUNTER — Other Ambulatory Visit (HOSPITAL_COMMUNITY): Payer: No Typology Code available for payment source

## 2021-01-21 ENCOUNTER — Other Ambulatory Visit: Payer: Self-pay

## 2021-01-21 MED ORDER — FLUCONAZOLE 150 MG PO TABS *I*
150.0000 mg | ORAL_TABLET | Freq: Once | ORAL | 0 refills | Status: DC
Start: 2021-01-21 — End: 2022-01-30
  Filled 2021-01-21: qty 2, 3d supply, fill #0

## 2021-01-24 ENCOUNTER — Other Ambulatory Visit: Payer: Self-pay

## 2021-01-24 ENCOUNTER — Ambulatory Visit (HOSPITAL_COMMUNITY)
Admission: RE | Admit: 2021-01-24 | Discharge: 2021-01-24 | Disposition: A | Discharge status: Home / Self Care | Payer: No Typology Code available for payment source | Source: Ambulatory Visit | Attending: Cardiology | Admitting: Cardiology

## 2021-01-24 ENCOUNTER — Ambulatory Visit: Payer: PRIVATE HEALTH INSURANCE

## 2021-01-24 DIAGNOSIS — I5022 Chronic systolic (congestive) heart failure: Secondary | ICD-10-CM | POA: Insufficient documentation

## 2021-01-24 LAB — BASIC METABOLIC PANEL
Anion gap: 8 (ref 5–15)
BUN: 11 mg/dL (ref 6–20)
CO2: 24 mmol/L (ref 22–32)
Calcium: 9.2 mg/dL (ref 8.9–10.3)
Chloride: 107 mmol/L (ref 98–111)
Creatinine, Ser: 0.83 mg/dL (ref 0.44–1.00)
GFR, Estimated: >60 mL/min (ref >60)
Glucose, Bld: 112 mg/dL — ABNORMAL HIGH (ref 70–99)
Potassium: 4.1 mmol/L (ref 3.5–5.1)
Sodium: 139 mmol/L (ref 135–145)

## 2021-01-28 ENCOUNTER — Ambulatory Visit: Admission: RE | Admit: 2021-01-28 | Payer: MEDICAID | Source: Ambulatory Visit | Admitting: Obstetrics & Gynecology

## 2021-01-28 ENCOUNTER — Encounter: Admission: RE | Payer: Self-pay | Source: Ambulatory Visit

## 2021-01-28 SURGERY — TOP PROCEDURE
Anesthesia: No Anesthesia Team Needed | Site: Pelvis

## 2021-01-30 ENCOUNTER — Other Ambulatory Visit: Payer: Self-pay

## 2021-02-04 NOTE — Progress Notes (Incomplete)
***In Progress***  PCP: Salley Scarlet, MD PCP-Cardiologist: Dr. Shirlee Latch  HPI:   Ms Glendenning is a 30 y.o. female G1P1. Pregnancy in 2020 was complicated by preeclampsia and gestational diabetes. She presented at [redacted] weeks pregnant with SOB and dizziness on 08/11/19 and was admitted for preeclampsia.Delivered a healthy girl.Post delivery course was uncomplicated. She was discharged home on nifedipinefor furtherBP management. Nifedipine ultimately discontinued at post-partum follow-up by OB.  She presented to the [facility] ED in 09/2019 with dyspneaand palpitations. Echo in 09/2019 showed reduced EF at 20%. HIV negative. No FH of cardiomyopathy or premature coronary disease.  No baseline coronary risk factors and no history of chest pain. Slight hs-TnI elevation (18=>21) consistent with CHF. Not c/w SCAD and no clear RWMAs on echo. TSH normal. 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 She was diuresed with IV furosemide with symptomatic improvement and improvement in volume. Diuresed to euvolemic state and placed on guideline-directed medical therapy. Started on daily PO furosemide 40 mg daily. Patient counseled on the need for contraception while on cardiac meds, particularly Entresto. Discharge weight 191 lbs.   Echo was done on 01/14/21 and reviewed, EF 35-40% with diffuse hypokinesis, normal RV size and systolic function, PASP 22, normal IVC.   She returned for followup of CHF on 01/14/21 with Dr. Shirlee Latch.  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  She was doing well symptomatically, no significant exertional dyspnea or chest pain. No lightheadedness or palpitations.  No orthopnea/PND.  She was taking all her of meds.  Today she returns to HF clinic for pharmacist medication titration. At last visit with MD, carvedilol was increased to 9.375 mg BID and Entresto was increased to 97-103 mg BID.   Overall feeling ***. Dizziness,  lightheadedness, fatigue:  Chest pain or palpitations:  How is your breathing?: *** SOB: Able to complete all ADLs. Activity level ***  Weight at home pounds. Takes furosemide/torsemide/bumex *** mg *** daily.  LEE PND/Orthopnea  Appetite *** Low-salt diet:   Physical Exam Cost/affordability of meds   HF Medications: Carvedilol 9.375 mg BID Entresto 97-103 mg BID Spironolactone 25 mg daily Farxiga 10 mg daily  Has the patient been experiencing any side effects to the medications prescribed?  {YES NO:22349}  Does the patient have any problems obtaining medications due to transportation or finances?   {YES NO:22349}  Understanding of regimen: {excellent/good/fair/poor:19665} Understanding of indications: {excellent/good/fair/poor:19665} Potential of compliance: {excellent/good/fair/poor:19665} Patient understands to avoid NSAIDs. Patient understands to avoid decongestants.    Pertinent Lab Values (01/24/21) . Serum creatinine 0.83, BUN 11, Potassium 4.1, Sodium 139   Vital Signs: . Weight: *** (last clinic weight: 203 lbs) . Blood pressure: ***  . Heart rate: ***   Assessment/Plan: 1. 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 Echo in 09/2019 with EF 20% and diffuse hypokinesis.RFs include African-American, gestational diabetes, and pre-eclampsia. She was HIV negative. Father has CHF, she is not certain about details.  She does not drink ETOH or use drugs. No chest pain, CAD is unlikely.  TSH has been normal.  Echo 01/14/21 showed EF up to 35-40% with normal RV.  - NYHA class I-II symptoms, euvolemic upon exam.  - Continue carvedilol 9.375 mg BID.  - Continue Entresto 97/103 BID. - Continue spironolactone 25 mg daily. - Continue Farxiga 10 mg daily.     - She understands risk of fetal toxicity with HF meds and also the risk of worsening HF if she  were to get pregnant again.  She is using contraception.   2. Palpitations/Presyncope: 11/2020 Zio  Patch showed no significant arrhythmias.   Karle Plumber, PharmD, BCPS, BCCP, CPP Heart Failure Clinic Pharmacist [phone]

## 2021-02-05 ENCOUNTER — Ambulatory Visit (HOSPITAL_COMMUNITY)
Admission: RE | Admit: 2021-02-05 | Discharge: 2021-02-05 | Disposition: A | Discharge status: Home / Self Care | Payer: No Typology Code available for payment source | Source: Ambulatory Visit | Attending: Pharmacist | Admitting: Pharmacist

## 2021-02-05 ENCOUNTER — Other Ambulatory Visit: Payer: Self-pay

## 2021-02-05 DIAGNOSIS — R55 Syncope and collapse: Secondary | ICD-10-CM | POA: Insufficient documentation

## 2021-02-05 DIAGNOSIS — I5022 Chronic systolic (congestive) heart failure: Secondary | ICD-10-CM | POA: Insufficient documentation

## 2021-02-05 DIAGNOSIS — R002 Palpitations: Secondary | ICD-10-CM | POA: Insufficient documentation

## 2021-02-05 MED ORDER — CARVEDILOL 12.5 MG PO TABS: 12.5000 mg | ORAL_TABLET | Freq: Two times a day (BID) | ORAL | 3 refills | Status: DC

## 2021-02-05 NOTE — Progress Notes (Signed)
PCP: Salley Scarlet, MD PCP-Cardiologist: Dr. Shirlee Latch  HPI:  Ms Raffo is a 30 y.o. female G1P1. Pregnancy in 2020 was complicated by preeclampsia and gestational diabetes. She presented at [redacted] weeks pregnant with SOB and dizziness on 08/11/19 and was admitted for preeclampsia.Delivered a healthy girl.Post delivery course was uncomplicated. She was discharged home on nifedipinefor furtherBP management. Nifedipine ultimately discontinued at post-partum follow-up by OB.  She presented to the [facility] ED in 09/2019 with dyspneaand palpitations. Echo in 09/2019 showed reduced EF at 20%. HIV negative. No FH of cardiomyopathy or premature coronary disease.  No baseline coronary risk factors and no history of chest pain. Slight hs-TnI elevation (18=>21) consistent with CHF. Not c/w SCAD and no clear RWMAs on echo. TSH normal. 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 She was diuresed with IV furosemide with symptomatic improvement and improvement in volume. Diuresed to euvolemic state and placed on guideline-directed medical therapy. Started on daily PO furosemide 40 mg daily. Patient counseled on the need for contraception while on cardiac meds, particularly Entresto. Discharge weight 191 lbs.   Echo was done on 01/14/21 and reviewed, EF 35-40% with diffuse hypokinesis, normal RV size and systolic function, PASP 22, normal IVC.   She returned for followup of CHF with Dr. Shirlee Latch on 01/14/21.  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  She was doing well symptomatically, no significant exertional dyspnea or chest pain. No lightheadedness or palpitations.  No orthopnea/PND.  She was taking all her of meds.  Today she returns to HF clinic for pharmacist medication titration. At last visit with MD, carvedilol was increased to 9.375 mg BID and Entresto was increased to 97-103 mg BID. Overall feeling well today. No dizziness or lightheadedness.  No fatigue. No chest pain or palpitations. No SOB/DOE. Weight at home is stable around 200 lbs. No diuretic use at home. No LEE/PND/Orthopnea. Her appetite is doing well. She is taking all of her medications as prescribed with no issues.    HF Medications: Carvedilol 9.375 mg BID Entresto 97-103 mg BID Spironolactone 25 mg daily Farxiga 10 mg daily  Has the patient been experiencing any side effects to the medications prescribed?  No, patient is tolerating medications well.    Does the patient have any problems obtaining medications due to transportation or finances?   No, patient has Hospital doctor and  Medicaid [employer].   Understanding of regimen: good Understanding of indications: good Potential of compliance: good Patient understands to avoid NSAIDs. Patient understands to avoid decongestants.    Pertinent Lab Values (01/24/21) . Serum creatinine 0.83, BUN 11, Potassium 4.1, Sodium 139   Vital Signs: . Weight: 201.8 lbs (last clinic weight: 203 lbs) . Blood pressure: 110/74  . Heart rate: 95  Assessment/Plan: 1. 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 Echo in 09/2019 with EF 20% and diffuse hypokinesis.RFs include African-American, gestational diabetes, and pre-eclampsia. She was HIV negative. Father has CHF, she is not certain about details.  She does not drink ETOH or use drugs. No chest pain, CAD is unlikely.  TSH has been normal.  Echo 01/14/21 showed EF up to 35-40% with normal RV.  - NYHA class I-II symptoms, euvolemic upon exam.  - Increase carvedilol to 12.5 mg BID.   - Continue Entresto 97/103 mg BID. - Continue spironolactone 25 mg daily. - Continue Farxiga 10 mg daily.     - Follow up with HF pharmacy clinic  on 03/05/21.  2. Palpitations/Presyncope: 11/2020 Zio Patch showed no significant arrhythmias.     Karle Plumber, PharmD, BCPS, BCCP, CPP Heart Failure Clinic Pharmacist [phone]

## 2021-02-05 NOTE — Patient Instructions (Signed)
It was a pleasure seeing you today!  MEDICATIONS: -We are changing your medications today -Increase carvedilol to 12.5 mg (1 tablet) twice daily.  -Call if you have questions about your medications.   NEXT APPOINTMENT: Return to clinic in 1 month with Pharmacy Clinic.  In general, to take care of your heart failure: -Limit your fluid intake to 2 Liters (half-gallon) per day.   -Limit your salt intake to ideally 2-3 grams (2000-3000 mg) per day. -Weigh yourself daily and record, and bring that "weight diary" to your next appointment.  (Weight gain of 2-3 pounds in 1 day typically means fluid weight.) -The medications for your heart are to help your heart and help you live longer.   -Please contact us before stopping any of your heart medications.  Call the clinic at [phone] with questions or to reschedule future appointments.

## 2021-02-20 ENCOUNTER — Encounter: Payer: No Typology Code available for payment source | Admitting: Family Medicine

## 2021-02-20 ENCOUNTER — Other Ambulatory Visit: Payer: Self-pay

## 2021-02-21 NOTE — Progress Notes (Signed)
PCP: Salley Scarlet, MD PCP-Cardiologist: Dr. Shirlee Latch  HPI:  Robin Gallegos is a 30 y.o. female G1P1. Pregnancy in 2020 was complicated by preeclampsia and gestational diabetes. She presented at [redacted] weeks pregnant with SOB and dizziness on 08/11/19 and was admitted for preeclampsia.Delivered a healthy girl.Post delivery course was uncomplicated. She was discharged home on nifedipinefor furtherBP management. Nifedipine ultimately discontinued at post-partum follow-up by OB.  She presented to the [facility] ED in 09/2019 with dyspneaand palpitations. Echo in 09/2019 showed reduced EF at 20%. HIV negative. No FH of cardiomyopathy or premature coronary disease.  No baseline coronary risk factors and no history of chest pain. Slight hs-TnI elevation (18=>21) consistent with CHF. Not c/w SCAD and no clear RWMAs on echo. TSH normal. 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 She was diuresed with IV furosemide with symptomatic improvement and improvement in volume. Diuresed to euvolemic state and placed on guideline-directed medical therapy. Started on daily PO furosemide 40 mg daily. Patient counseled on the need for contraception while on cardiac meds, particularly Entresto. Discharge weight 191 lbs.   Echo was done on 01/14/21 and reviewed, EF 35-40% with diffuse hypokinesis, normal RV size and systolic function, PASP 22, normal IVC.   She returned for followup of CHF with Dr. Shirlee Latch on 01/14/21.  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  She was doing well symptomatically, no significant exertional dyspnea or chest pain. No lightheadedness or palpitations.  No orthopnea/PND.  She was taking all her of meds.  She returned to HF clinic for pharmacist medication titration. At last visit with MD, carvedilol was increased to 9.375 mg BID and Entresto was increased to 97-103 mg BID. Overall was feeling well. No dizziness or lightheadedness. No  fatigue. No chest pain or palpitations. No SOB/DOE. Weight at home was stable around 200 lbs. No diuretic use at home. No LEE/PND/Orthopnea.   Today she returns to HF clinic for pharmacist medication titration. At last visit with pharmacy clinic, carvedilol was increased to 12.5 mg BID.  Overall she is feeling well. Does state that she has more dizziness and lightheadedness since increasing the carvedilol. However, she thinks this may be related to not taking the carvedilol with food. No chest pain or palpitations. No SOB/DOE. Weight has been stable at home. Does not take any diuretic. No LEE, PND or orthopnea.    HF Medications: Carvedilol 12.5 mg BID Entresto 97-103 mg BID Spironolactone 25 mg daily Farxiga 10 mg daily  Has the patient been experiencing any side effects to the medications prescribed?  No   Does the patient have any problems obtaining medications due to transportation or finances?   No, patient has Hospital doctor and [place] Medicaid [employer].   Understanding of regimen: good Understanding of indications: good Potential of compliance: good Patient understands to avoid NSAIDs. Patient understands to avoid decongestants.    Pertinent Lab Values (01/24/21) . Serum creatinine 0.83, BUN 11, Potassium 4.1, Sodium 139   Vital Signs: . Weight: 204 lbs (last clinic weight: 203 lbs) . Blood pressure: 120/78 . Heart rate: 94  Assessment/Plan: 1. 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 Echo in 09/2019 with EF 20% and diffuse hypokinesis.RFs include African-American, gestational diabetes, and pre-eclampsia. She was HIV negative. Father has CHF, she is not certain about details.  She does not drink ETOH or use drugs. No chest pain, CAD is unlikely.  TSH has been normal.  Echo 01/14/21  showed EF up to 35-40% with normal RV.  - NYHA class I-II symptoms, euvolemic on exam.  - Increase carvedilol to 18.75 mg BID.   - Continue Entresto 97/103 mg  BID. - Continue spironolactone 25 mg daily. - Continue Farxiga 10 mg daily.     - Follow up with HF clinic in 6 weeks with Dr. Shirlee Latch  2. Palpitations/Presyncope: 11/2020 Zio Patch showed no significant arrhythmias.     Karle Plumber, PharmD, BCPS, BCCP, CPP Heart Failure Clinic Pharmacist [phone]

## 2021-03-01 ENCOUNTER — Other Ambulatory Visit: Payer: Self-pay

## 2021-03-01 ENCOUNTER — Ambulatory Visit (HOSPITAL_COMMUNITY): Payer: Self-pay

## 2021-03-01 ENCOUNTER — Encounter (HOSPITAL_COMMUNITY): Payer: Self-pay | Admitting: Emergency Medicine

## 2021-03-01 ENCOUNTER — Ambulatory Visit (HOSPITAL_COMMUNITY)
Admission: EM | Admit: 2021-03-01 | Discharge: 2021-03-01 | Disposition: A | Discharge status: Home / Self Care | Payer: No Typology Code available for payment source | Attending: Emergency Medicine | Admitting: Emergency Medicine

## 2021-03-01 DIAGNOSIS — Z880 Allergy status to penicillin: Secondary | ICD-10-CM | POA: Insufficient documentation

## 2021-03-01 DIAGNOSIS — Z113 Encounter for screening for infections with a predominantly sexual mode of transmission: Secondary | ICD-10-CM | POA: Diagnosis not present

## 2021-03-01 DIAGNOSIS — Z79899 Other long term (current) drug therapy: Secondary | ICD-10-CM | POA: Diagnosis not present

## 2021-03-01 DIAGNOSIS — Z881 Allergy status to other antibiotic agents status: Secondary | ICD-10-CM | POA: Insufficient documentation

## 2021-03-01 DIAGNOSIS — N898 Other specified noninflammatory disorders of vagina: Secondary | ICD-10-CM | POA: Insufficient documentation

## 2021-03-01 MED ORDER — FLUCONAZOLE 200 MG PO TABS: 200.0000 mg | ORAL_TABLET | Freq: Once | ORAL | 0 refills | Status: AC

## 2021-03-01 NOTE — ED Triage Notes (Signed)
Patient c/o vaginal itching x 2 months.  Patient denies any vaginal discharge.   Patient endorses a "bump on the outer lips of vagina". Patient denies any drainage from site.   Patient denies any ABD/pelvic pain and dysuria.    Patient states she's tried the OTC monistat cream w/ no relief of symptoms.

## 2021-03-01 NOTE — ED Provider Notes (Signed)
[facility]    CSN: [id number] Arrival date & time: 03/01/21  0840      History   Chief Complaint Chief Complaint  Patient presents with  . Vaginal Itching    HPI Robin Gallegos is a 30 y.o. female.   Thera Flake presents with complaints of vaginal itching for almost two months now. Over the counter monistat did briefly help. No pelvic pain, vaginal bleeding, back pain or urinary symptoms. Denies any previous similar. No known std exposure. Hasn't noticed change in vaginal discharge. Last PAP was with pregnancy in 2020.     ROS per HPI, negative if not otherwise mentioned.      Past Medical History:  Diagnosis Date  . CHF (congestive heart failure) (HCC)   . Pre-eclampsia     Patient Active Problem List   Diagnosis Date Noted  . Cardiomyopathy (HCC) 09/19/2020  . Class 1 obesity 09/19/2020  . H/O gestational diabetes mellitus, not currently pregnant 02/29/2020  . CHF (congestive heart failure) (HCC) 10/04/2019    History reviewed. No pertinent surgical history.  OB History    Gravida  1   Para  1   Term  1   Preterm  0   AB  0   Living  1     SAB  0   IAB  0   Ectopic  0   Multiple  0   Live Births  1            Home Medications    Prior to Admission medications   Medication Sig Start Date End Date Taking? Authorizing Provider  carvedilol (COREG) 12.5 MG tablet Take 1 tablet (12.5 mg total) by mouth 2 (two) times daily with a meal. 02/05/21  Yes Laurey Morale, MD  dapagliflozin propanediol (FARXIGA) 10 MG TABS tablet Take 10 mg by mouth daily before breakfast. 02/29/20  Yes Jenne Pane, Crystal A, FNP  fluconazole (DIFLUCAN) 200 MG tablet Take 1 tablet (200 mg total) by mouth once for 1 dose. 03/01/21 03/01/21 Yes Gustav Knueppel, Barron Alvine, NP  sacubitril-valsartan (ENTRESTO) 97-103 MG Take 1 tablet by mouth 2 (two) times daily. 01/14/21  Yes Laurey Morale, MD  spironolactone (ALDACTONE) 25 MG tablet Take 1 tablet (25 mg total) by mouth  daily. 10/08/20  Yes Schertz, Velna Hatchet, MD    Family History Family History  Problem Relation Age of Onset  . Hypertension Mother   . Heart failure Father   . Hypertension Sister   . Hypertension Sister   . Hypertension Sister     Social History Social History   Tobacco Use  . Smoking status: Never Smoker  . Smokeless tobacco: Never Used  Vaping Use  . Vaping Use: Never used  Substance Use Topics  . Alcohol use: Not Currently  . Drug use: Not Currently     Allergies   Penicillins and Cleocin [clindamycin]   Review of Systems Review of Systems   Physical Exam Triage Vital Signs ED Triage Vitals  Enc Vitals Group     BP 03/01/21 0902 117/79     Pulse Rate 03/01/21 0902 90     Resp 03/01/21 0902 15     Temp 03/01/21 0902 99.1 F (37.3 C)     Temp Source 03/01/21 0902 Oral     SpO2 03/01/21 0902 98 %     Weight --      Height --      Head Circumference --      Peak Flow --  Pain Score 03/01/21 0900 0     Pain Loc --      Pain Edu? --      Excl. in GC? --    No data found.  Updated Vital Signs BP 117/79 (BP Location: Right Arm)   Pulse 90   Temp 99.1 F (37.3 C) (Oral)   Resp 15   LMP 01/26/2021   SpO2 98%   Visual Acuity Right Eye Distance:   Left Eye Distance:   Bilateral Distance:    Right Eye Near:   Left Eye Near:    Bilateral Near:     Physical Exam Constitutional:      General: She is not in acute distress.    Appearance: She is well-developed.  Cardiovascular:     Rate and Rhythm: Normal rate.  Pulmonary:     Effort: Pulmonary effort is normal.  Abdominal:     Tenderness: There is no abdominal tenderness. There is no right CVA tenderness or left CVA tenderness.  Genitourinary:    General: Normal vulva.     Pubic Area: No rash.      Labia:        Right: No lesion.        Left: No lesion.      Vagina: Normal.     Comments: Thin white vaginal discharge noted  Skin:    General: Skin is warm and dry.  Neurological:      Mental Status: She is alert and oriented to person, place, and time.      UC Treatments / Results  Labs (all labs ordered are listed, but only abnormal results are displayed) Labs Reviewed  CERVICOVAGINAL ANCILLARY ONLY    EKG   Radiology No results found.  Procedures Procedures (including critical care time)  Medications Ordered in UC Medications - No data to display  Initial Impression / Assessment and Plan / UC Course  I have reviewed the triage vital signs and the nursing notes.  Pertinent labs & imaging results that were available during my care of the patient were reviewed by me and considered in my medical decision making (see chart for details).     Vaginal itching, monistat did provide some temporary relief. No known std exposure. Diflucan provided pending vaginal cytology results as exam is not obviously consistent with candida today. Return precautions provided. Patient verbalized understanding and agreeable to plan.   Final Clinical Impressions(s) / UC Diagnoses   Final diagnoses:  Vaginal itching     Discharge Instructions     I have sent a pill for you to take today to treat any yeast infection.  We are testing your vaginal swab to confirm yeast and evaluate for any bacterial vaginosis.  We will notify of you any positive findings or if any changes to treatment are needed. If normal or otherwise without concern to your results, we will not call you. Please log on to your MyChart to review your results if interested in so.   If symptoms worsen or do not improve in the next week to return to be seen or to follow up with your PCP.     ED Prescriptions    Medication Sig Dispense Auth. Provider   fluconazole (DIFLUCAN) 200 MG tablet Take 1 tablet (200 mg total) by mouth once for 1 dose. 1 tablet Georgetta Haber, NP     PDMP not reviewed this encounter.   Georgetta Haber, NP 03/01/21 [phone]

## 2021-03-01 NOTE — Discharge Instructions (Addendum)
I have sent a pill for you to take today to treat any yeast infection.  We are testing your vaginal swab to confirm yeast and evaluate for any bacterial vaginosis.  We will notify of you any positive findings or if any changes to treatment are needed. If normal or otherwise without concern to your results, we will not call you. Please log on to your MyChart to review your results if interested in so.   If symptoms worsen or do not improve in the next week to return to be seen or to follow up with your PCP.

## 2021-03-04 LAB — CERVICOVAGINAL ANCILLARY ONLY
Bacterial Vaginitis (gardnerella): NEGATIVE
Candida Glabrata: NEGATIVE
Candida Vaginitis: POSITIVE — AB
Chlamydia: NEGATIVE
Comment: NEGATIVE
Comment: NEGATIVE
Comment: NEGATIVE
Comment: NEGATIVE
Comment: NEGATIVE
Comment: NORMAL
Neisseria Gonorrhea: NEGATIVE
Trichomonas: NEGATIVE

## 2021-03-05 ENCOUNTER — Ambulatory Visit (HOSPITAL_COMMUNITY)
Admission: RE | Admit: 2021-03-05 | Discharge: 2021-03-05 | Disposition: A | Discharge status: Home / Self Care | Payer: No Typology Code available for payment source | Source: Ambulatory Visit | Attending: Cardiology | Admitting: Cardiology

## 2021-03-05 ENCOUNTER — Other Ambulatory Visit: Payer: Self-pay

## 2021-03-05 DIAGNOSIS — Z79899 Other long term (current) drug therapy: Secondary | ICD-10-CM | POA: Diagnosis not present

## 2021-03-05 DIAGNOSIS — Z7984 Long term (current) use of oral hypoglycemic drugs: Secondary | ICD-10-CM | POA: Insufficient documentation

## 2021-03-05 DIAGNOSIS — R002 Palpitations: Secondary | ICD-10-CM | POA: Insufficient documentation

## 2021-03-05 DIAGNOSIS — I5022 Chronic systolic (congestive) heart failure: Secondary | ICD-10-CM | POA: Insufficient documentation

## 2021-03-05 DIAGNOSIS — Z8759 Personal history of other complications of pregnancy, childbirth and the puerperium: Secondary | ICD-10-CM | POA: Diagnosis not present

## 2021-03-05 MED ORDER — CARVEDILOL 12.5 MG PO TABS: 18.7500 mg | ORAL_TABLET | Freq: Two times a day (BID) | ORAL | 3 refills | Status: DC

## 2021-03-05 NOTE — Patient Instructions (Addendum)
It was a pleasure seeing you today!  MEDICATIONS: -We are changing your medications today -Increase carvedilol to 18.75 mg (1.5 tablets) twice daily.  -Call if you have questions about your medications.  NEXT APPOINTMENT: Return to clinic in 6 weeks with Dr. Shirlee Latch.  In general, to take care of your heart failure: -Limit your fluid intake to 2 Liters (half-gallon) per day.   -Limit your salt intake to ideally 2-3 grams (2000-3000 mg) per day. -Weigh yourself daily and record, and bring that "weight diary" to your next appointment.  (Weight gain of 2-3 pounds in 1 day typically means fluid weight.) -The medications for your heart are to help your heart and help you live longer.   -Please contact us before stopping any of your heart medications.  Call the clinic at [phone] with questions or to reschedule future appointments.

## 2021-03-11 ENCOUNTER — Other Ambulatory Visit: Payer: Self-pay

## 2021-03-11 ENCOUNTER — Other Ambulatory Visit
Admission: RE | Admit: 2021-03-11 | Discharge: 2021-03-11 | Disposition: A | Discharge status: Home / Self Care | Payer: MEDICAID | Source: Ambulatory Visit

## 2021-03-11 ENCOUNTER — Other Ambulatory Visit
Admission: RE | Admit: 2021-03-11 | Discharge: 2021-03-11 | Disposition: A | Discharge status: Home / Self Care | Payer: MEDICAID | Source: Ambulatory Visit | Attending: Obstetrics and Gynecology | Admitting: Obstetrics and Gynecology

## 2021-03-11 DIAGNOSIS — Z118 Encounter for screening for other infectious and parasitic diseases: Secondary | ICD-10-CM | POA: Insufficient documentation

## 2021-03-11 DIAGNOSIS — Z114 Encounter for screening for human immunodeficiency virus [HIV]: Secondary | ICD-10-CM | POA: Insufficient documentation

## 2021-03-11 DIAGNOSIS — Z7251 High risk heterosexual behavior: Secondary | ICD-10-CM | POA: Insufficient documentation

## 2021-03-11 DIAGNOSIS — Z113 Encounter for screening for infections with a predominantly sexual mode of transmission: Secondary | ICD-10-CM | POA: Insufficient documentation

## 2021-03-11 MED ORDER — METRONIDAZOLE 500 MG PO TABS *I*
500.00 mg | ORAL_TABLET | Freq: Two times a day (BID) | ORAL | 0 refills | Status: DC
Start: 2021-03-11 — End: 2022-03-05
  Filled 2021-03-11: qty 14, 7d supply, fill #0

## 2021-03-11 MED ORDER — FLUCONAZOLE 150 MG PO TABS *I*
150.0000 mg | ORAL_TABLET | ORAL | 0 refills | Status: DC | PRN
Start: 2021-03-11 — End: 2021-04-24
  Filled 2021-03-11: qty 2, 6d supply, fill #0

## 2021-03-12 LAB — TRICHOMONAS DNA AMPLIFICATION: Trichomonas DNA amplification: 0

## 2021-03-12 LAB — HIV 1&2 ANTIGEN/ANTIBODY: HIV 1&2 ANTIGEN/ANTIBODY: NONREACTIVE

## 2021-03-12 LAB — N. GONORRHOEAE DNA AMPLIFICATION: N. gonorrhoeae DNA Amplification: 0

## 2021-03-12 LAB — SYPHILIS SCREEN
Syphilis Screen: NEGATIVE
Syphilis Status: NONREACTIVE

## 2021-03-12 LAB — HEPATITIS C ANTIBODY: Hep C Ab: NEGATIVE

## 2021-03-12 LAB — CHLAMYDIA PLASMID DNA AMPLIFICATION: Chlamydia Plasmid DNA Amplification: 0

## 2021-04-03 ENCOUNTER — Other Ambulatory Visit: Payer: Self-pay

## 2021-04-03 ENCOUNTER — Other Ambulatory Visit
Admission: RE | Admit: 2021-04-03 | Discharge: 2021-04-03 | Disposition: A | Discharge status: Home / Self Care | Payer: MEDICAID | Source: Ambulatory Visit | Attending: Obstetrics and Gynecology | Admitting: Obstetrics and Gynecology

## 2021-04-03 DIAGNOSIS — N898 Other specified noninflammatory disorders of vagina: Secondary | ICD-10-CM | POA: Insufficient documentation

## 2021-04-03 MED ORDER — NORETHIN ACE-ETH ESTRAD-FE 1-20 MG-MCG PO TABS *A*
1.0000 | ORAL_TABLET | Freq: Every day | ORAL | 2 refills | Status: DC
Start: 2021-04-03 — End: 2022-01-22
  Filled 2021-04-03: qty 84, 84d supply, fill #0

## 2021-04-04 ENCOUNTER — Other Ambulatory Visit: Payer: Self-pay

## 2021-04-04 LAB — VAGINITIS SCREEN: DNA PROBE: Vaginitis Screen:DNA Probe: 0

## 2021-04-16 ENCOUNTER — Ambulatory Visit (HOSPITAL_COMMUNITY)
Admission: RE | Admit: 2021-04-16 | Discharge: 2021-04-16 | Disposition: A | Discharge status: Home / Self Care | Payer: No Typology Code available for payment source | Source: Ambulatory Visit | Attending: Cardiology | Admitting: Cardiology

## 2021-04-16 ENCOUNTER — Encounter (HOSPITAL_COMMUNITY): Payer: Self-pay | Admitting: Cardiology

## 2021-04-16 ENCOUNTER — Other Ambulatory Visit: Payer: Self-pay

## 2021-04-16 VITALS — BP 112/80 | HR 88 | Wt 201.8 lb

## 2021-04-16 DIAGNOSIS — R002 Palpitations: Secondary | ICD-10-CM | POA: Diagnosis not present

## 2021-04-16 DIAGNOSIS — Z880 Allergy status to penicillin: Secondary | ICD-10-CM | POA: Insufficient documentation

## 2021-04-16 DIAGNOSIS — O903 Peripartum cardiomyopathy: Secondary | ICD-10-CM | POA: Diagnosis not present

## 2021-04-16 DIAGNOSIS — I5022 Chronic systolic (congestive) heart failure: Secondary | ICD-10-CM | POA: Diagnosis not present

## 2021-04-16 DIAGNOSIS — Z79899 Other long term (current) drug therapy: Secondary | ICD-10-CM | POA: Insufficient documentation

## 2021-04-16 DIAGNOSIS — Z8249 Family history of ischemic heart disease and other diseases of the circulatory system: Secondary | ICD-10-CM | POA: Insufficient documentation

## 2021-04-16 DIAGNOSIS — Z7984 Long term (current) use of oral hypoglycemic drugs: Secondary | ICD-10-CM | POA: Diagnosis not present

## 2021-04-16 DIAGNOSIS — Z8759 Personal history of other complications of pregnancy, childbirth and the puerperium: Secondary | ICD-10-CM | POA: Insufficient documentation

## 2021-04-16 DIAGNOSIS — Z881 Allergy status to other antibiotic agents status: Secondary | ICD-10-CM | POA: Insufficient documentation

## 2021-04-16 LAB — CBC
HCT: 38.9 % (ref 36.0–46.0)
Hemoglobin: 12.2 g/dL (ref 12.0–15.0)
MCH: 25.6 pg — ABNORMAL LOW (ref 26.0–34.0)
MCHC: 31.4 g/dL (ref 30.0–36.0)
MCV: 81.7 fL (ref 80.0–100.0)
Platelets: 382 10*3/uL (ref 150–400)
RBC: 4.76 MIL/uL (ref 3.87–5.11)
RDW: 14.1 % (ref 11.5–15.5)
WBC: 6.2 10*3/uL (ref 4.0–10.5)
nRBC: 0 % (ref 0.0–0.2)

## 2021-04-16 LAB — BASIC METABOLIC PANEL
Anion gap: 4 — ABNORMAL LOW (ref 5–15)
BUN: 13 mg/dL (ref 6–20)
CO2: 26 mmol/L (ref 22–32)
Calcium: 9.2 mg/dL (ref 8.9–10.3)
Chloride: 108 mmol/L (ref 98–111)
Creatinine, Ser: 0.9 mg/dL (ref 0.44–1.00)
GFR, Estimated: >60 mL/min (ref >60)
Glucose, Bld: 97 mg/dL (ref 70–99)
Potassium: 4 mmol/L (ref 3.5–5.1)
Sodium: 138 mmol/L (ref 135–145)

## 2021-04-16 MED ORDER — CARVEDILOL 25 MG PO TABS: 25.0000 mg | ORAL_TABLET | Freq: Two times a day (BID) | ORAL | 3 refills | Status: DC

## 2021-04-16 NOTE — Patient Instructions (Signed)
INCREASE Coreg to 25 mg, one tab twice a day  Labs today We will only contact you if something comes back abnormal or we need to make some changes. Otherwise no news is good news!  Your physician has requested that you have a cardiac MRI. Cardiac MRI uses a computer to create images of your heart as its beating, producing both still and moving pictures of your heart and major blood vessels. For further information please visit [email or web address]. Please follow the instruction sheet given to you today for more information. -once we have this approved with your insurance, the scheduler will be in contact to schedule  Your physician recommends that you schedule a follow-up appointment in: 4 months with  in the Advanced Practitioners (PA/NP) Clinic   Do the following things EVERYDAY: 1) Weigh yourself in the morning before breakfast. Write it down and keep it in a log. 2) Take your medicines as prescribed 3) Eat low salt foods--Limit salt (sodium) to 2000 mg per day.  4) Stay as active as you can everyday 5) Limit all fluids for the day to less than 2 liters  At the [facility], you and your health needs are our priority. As part of our continuing mission to provide you with exceptional heart care, we have created designated Provider Care Teams. These Care Teams include your primary Cardiologist (physician) and Advanced Practice Providers (APPs- Physician Assistants and Nurse Practitioners) who all work together to provide you with the care you need, when you need it.   You may see any of the following providers on your designated Care Team at your next follow up: Marland Kitchen Dr Arvilla Meres . Dr Marca Ancona . Dr Thornell Mule . Tonye Becket, NP . Robbie Lis, PA . Shanda Bumps [place],NP . Karle Plumber, PharmD   Please be sure to bring in all your medications bottles to every appointment.   If you have any questions or concerns before your next appointment please send [place] a  message through [place] or call our office at [phone].    TO LEAVE A MESSAGE FOR THE NURSE SELECT OPTION 2, PLEASE LEAVE A MESSAGE INCLUDING: . YOUR NAME . DATE OF BIRTH . CALL BACK NUMBER . REASON FOR CALL**this is important as we prioritize the call backs  YOU WILL RECEIVE A CALL BACK THE SAME DAY AS LONG AS YOU CALL BEFORE 4:00 PM

## 2021-04-17 NOTE — Progress Notes (Signed)
Advanced Heart Failure Clinic Note    PCP: Patient, No Pcp Per (Inactive) PCP-Cardiologist: Dr. Shirlee Latch  HPI:  Robin Gallegos is a 30 y.o. female G1P1. Pregnancy in 2020 was complicated by preeclampsia and gestational diabetes. She presented at [redacted] weeks pregnant w/ SOB and dizziness on 08/11/19 and was admitted for preeclampsia.Delivered a healthy girl.Post delivery course was uncomplicated. She was discharged home on nifedipinefor furtherBP management. Nifedipine ultimately discontinued at post-partum f/u by OB.  She presented to the [facility] ED in 10/20 w/ dyspneaand palpitations.  Echo in 10/20 showed reduced EF at 20%. HIV negative. No FH of cardiomyopathy or premature coronary disease.  No baseline coronary risk factors and no h/o chest pain. Slight hs-TnI elevation (18=>21) consistent with CHF. Not c/w SCAD and no clear RWMAs on echo. TSH normal. 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 She ws diuresed w/ IV Lasix w/ symptomatic improvement and improvement in volume. Diuresed to euvolemic state and placed on guideline-directed medical therapy. Started on daily PO lasix 40 mg daily. Pt counseled on the need for contraception while on cardiac meds, particularly Entresto. Discharge wt 191 lb.   Echo in 1/22 showed EF 35-40% with diffuse hypokinesis, normal RV size and systolic function, PASP 22, normal IVC.   She returns for followup of CHF.  She is working as a Merchandiser, retail at [employer]. She uses contraception and understands the risks of pregnancy.  She is doing well symptomatically.  No exertional dyspnea or chest pain.  No lightheadedness.  No orthopnea/PND.  Weight is down 2 lbs.    Labs (11/21): K 4.1, creatinine 0.78 Labs (1/22): BNP 43 Labs (2/22): K 4.1, creatinine 0.83  PMH: 1. 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   - Echo (10/20): EF 20%.  - Echo (4/21): EF 20-25%  - Echo (1/22): EF 35-40%, diffuse hypokinesis, normal RV,  PASP 22, IVC normal.  2. Pre-eclampsia 3. Zio patch 12/21 with no significant arrhythmias.    Current Outpatient Medications  Medication Sig Dispense Refill  . dapagliflozin propanediol (FARXIGA) 10 MG TABS tablet Take 10 mg by mouth daily before breakfast. 30 tablet 7  . sacubitril-valsartan (ENTRESTO) 97-103 MG Take 1 tablet by mouth 2 (two) times daily. 180 tablet 3  . spironolactone (ALDACTONE) 25 MG tablet Take 1 tablet (25 mg total) by mouth daily. 90 tablet 2  . carvedilol (COREG) 25 MG tablet Take 1 tablet (25 mg total) by mouth 2 (two) times daily with a meal. 180 tablet 3   No current facility-administered medications for this encounter.    Allergies  Allergen Reactions  . Penicillins Hives    Did it involve swelling of the face/tongue/throat, SOB, or low BP? Unknown Did it involve sudden or severe rash/hives, skin peeling, or any reaction on the inside of your mouth or nose? Unknown Did you need to seek medical attention at a hospital or doctor's office? Unknown When did it last happen?pt was a child If all above answers are "NO", may proceed with cephalosporin use.   . Cleocin [Clindamycin] Rash      Social History   Socioeconomic History  . Marital status: Single    Spouse name: Not on file  . Number of children: Not on file  . Years of education: Not on file  . Highest education level: Not on file  Occupational History  . Not on file  Tobacco Use  . Smoking status: Never Smoker  . Smokeless tobacco: Never Used  Vaping Use  .  Vaping Use: Never used  Substance and Sexual Activity  . Alcohol use: Not Currently  . Drug use: Not Currently  . Sexual activity: Yes  Other Topics Concern  . Not on file  Social History Narrative  . Not on file   Social Determinants of Health   Financial Resource Strain: Not on file  Food Insecurity: Not on file  Transportation Needs: Not on file  Physical Activity: Not on file  Stress: Not on file  Social  Connections: Not on file  Intimate Partner Violence: Not on file      Family History  Problem Relation Age of Onset  . Hypertension Mother   . Heart failure Father   . Hypertension Sister   . Hypertension Sister   . Hypertension Sister     Vitals:   04/16/21 1407  BP: 112/80  Pulse: 88  SpO2: 98%  Weight: 91.5 kg (201 lb 12.8 oz)   Wt Readings from Last 3 Encounters:  04/16/21 91.5 kg (201 lb 12.8 oz)  03/05/21 92.5 kg (204 lb)  02/05/21 91.5 kg (201 lb 12.8 oz)    PHYSICAL EXAM: General: NAD Neck: No JVD, no thyromegaly or thyroid nodule.  Lungs: Clear to auscultation bilaterally with normal respiratory effort. CV: Nondisplaced PMI.  Heart regular S1/S2, no S3/S4, no murmur.  No peripheral edema.  No carotid bruit.  Normal pedal pulses.  Abdomen: Soft, nontender, no hepatosplenomegaly, no distention.  Skin: Intact without lesions or rashes.  Neurologic: Alert and oriented x 3.  Psych: Normal affect. Extremities: No clubbing or cyanosis.  HEENT: Normal.   ASSESSMENT & PLAN:  1. 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 Echo in 10/20 with EF 20% and diffuse hypokinesis.  RFs include African-American, gestational diabetes, and pre-eclampsia. She was HIV negative. Father has CHF, she is not certain about details.  She does not drink ETOH or use drugs. No chest pain, CAD is unlikely.  TSH has been normal.  Echo in 1/22 showed EF up to 35-40% with normal RV.  She is not volume overloaded on exam. NYHA class I-II.  - She will need cardiac MRI to more closely quantify EF (?ICD, not CRT candidate with narrow QRS) and also to look for infiltrative disease, myocarditis, or signs of prior MI (unlikely) => should be able to get this now that she has insurance.  - Continue Entresto 97/103 bid. BMET today.  - Continue Farxiga 10 mg daily.  - Continue spironolactone 25 mg daily.   - Increase Coreg to 25 mg bid.   - She understands risk of fetal toxicity w/ HF meds  and also the risk of worsening HF if she were to get pregnant again.  She is using contraception.  2. Palpitations/Presyncope: 11/2020 Zio Patch showed no significant arrhythmias.   Followup in 4 months.   Marca Ancona, MD 04/17/21

## 2021-04-24 ENCOUNTER — Other Ambulatory Visit: Payer: Self-pay

## 2021-04-24 ENCOUNTER — Other Ambulatory Visit
Admission: RE | Admit: 2021-04-24 | Discharge: 2021-04-24 | Disposition: A | Discharge status: Home / Self Care | Payer: MEDICAID | Source: Ambulatory Visit | Attending: Medical | Admitting: Medical

## 2021-04-24 DIAGNOSIS — N898 Other specified noninflammatory disorders of vagina: Secondary | ICD-10-CM | POA: Insufficient documentation

## 2021-04-24 MED ORDER — FLUCONAZOLE 150 MG PO TABS *I*
ORAL_TABLET | ORAL | 0 refills | Status: DC
Start: 2021-04-24 — End: 2021-04-24
  Filled 2021-04-24: qty 2, 6d supply, fill #0

## 2021-04-24 MED ORDER — FLUCONAZOLE 150 MG PO TABS *I*
150.0000 mg | ORAL_TABLET | ORAL | 0 refills | Status: DC
Start: 2021-04-24 — End: 2021-04-29
  Filled 2021-04-24: qty 2, 6d supply, fill #0

## 2021-04-26 LAB — AEROBIC CULTURE: Aerobic Culture: 0

## 2021-04-29 ENCOUNTER — Other Ambulatory Visit: Payer: Self-pay

## 2021-04-29 MED ORDER — FLUCONAZOLE 150 MG PO TABS *I*
150.0000 mg | ORAL_TABLET | ORAL | 0 refills | Status: DC
Start: 2021-04-29 — End: 2021-06-12
  Filled 2021-04-29: qty 2, 4d supply, fill #0

## 2021-04-29 MED ORDER — TERCONAZOLE 0.8 % VA CREA *A*
1.0000 | TOPICAL_CREAM | Freq: Every evening | VAGINAL | 0 refills | Status: AC
Start: 2021-04-29 — End: 2021-05-03
  Filled 2021-04-29: qty 20, 3d supply, fill #0

## 2021-04-30 ENCOUNTER — Other Ambulatory Visit: Payer: Self-pay

## 2021-05-08 ENCOUNTER — Other Ambulatory Visit: Payer: Self-pay

## 2021-05-27 ENCOUNTER — Telehealth (HOSPITAL_COMMUNITY): Payer: Self-pay | Admitting: Emergency Medicine

## 2021-05-27 NOTE — Telephone Encounter (Signed)
Reaching out to patient to offer assistance regarding upcoming cardiac imaging study; pt verbalizes understanding of appt date/time, parking situation and where to check in, and verified current allergies; name and call back number provided for further questions should they arise Rockwell Alexandria RN Navigator Cardiac Imaging [facility] [phone] office [phone] cell  Denies implants, some claustro but doesn't need medicated

## 2021-05-27 NOTE — Telephone Encounter (Signed)
Attempted to call patient regarding upcoming cardiac MR appointment. Left message on voicemail with name and callback number Kinaya Hilliker RN Navigator Cardiac Imaging  [facility] [phone] Office [phone] Cell  

## 2021-05-28 ENCOUNTER — Other Ambulatory Visit: Payer: Self-pay

## 2021-05-28 ENCOUNTER — Ambulatory Visit (HOSPITAL_COMMUNITY)
Admission: RE | Admit: 2021-05-28 | Discharge: 2021-05-28 | Disposition: A | Discharge status: Home / Self Care | Payer: No Typology Code available for payment source | Source: Ambulatory Visit | Attending: Cardiology | Admitting: Cardiology

## 2021-05-28 DIAGNOSIS — O903 Peripartum cardiomyopathy: Secondary | ICD-10-CM | POA: Diagnosis not present

## 2021-05-28 DIAGNOSIS — I5022 Chronic systolic (congestive) heart failure: Secondary | ICD-10-CM | POA: Diagnosis not present

## 2021-05-28 MED ORDER — GADOBUTROL 1 MMOL/ML IV SOLN
10.0000 mL | Freq: Once | INTRAVENOUS | Status: AC | PRN
  Administered 2021-05-28: 13:00:00 10 mL via INTRAVENOUS

## 2021-06-12 ENCOUNTER — Other Ambulatory Visit: Payer: Self-pay

## 2021-06-12 ENCOUNTER — Other Ambulatory Visit
Admission: RE | Admit: 2021-06-12 | Discharge: 2021-06-12 | Disposition: A | Discharge status: Home / Self Care | Payer: MEDICAID | Source: Ambulatory Visit | Attending: Family Medicine | Admitting: Family Medicine

## 2021-06-12 DIAGNOSIS — Z711 Person with feared health complaint in whom no diagnosis is made: Secondary | ICD-10-CM | POA: Insufficient documentation

## 2021-06-12 DIAGNOSIS — N760 Acute vaginitis: Secondary | ICD-10-CM | POA: Insufficient documentation

## 2021-06-12 DIAGNOSIS — Z7251 High risk heterosexual behavior: Secondary | ICD-10-CM | POA: Insufficient documentation

## 2021-06-12 MED ORDER — FLUCONAZOLE 150 MG PO TABS *I*
150.0000 mg | ORAL_TABLET | ORAL | 0 refills | Status: DC
Start: 2021-06-12 — End: 2021-11-19
  Filled 2021-06-12: qty 2, 4d supply, fill #0

## 2021-06-12 MED ORDER — METRONIDAZOLE 0.75 % VA GEL *I*
1.0000 | Freq: Every evening | VAGINAL | 0 refills | Status: DC
Start: 2021-06-12 — End: 2022-03-05
  Filled 2021-06-12: qty 70, 5d supply, fill #0

## 2021-06-13 LAB — CHLAMYDIA PLASMID DNA AMPLIFICATION: Chlamydia Plasmid DNA Amplification: 0

## 2021-06-13 LAB — VAGINITIS SCREEN: DNA PROBE: Vaginitis Screen:DNA Probe: 0

## 2021-06-13 LAB — N. GONORRHOEAE DNA AMPLIFICATION: N. gonorrhoeae DNA Amplification: 0

## 2021-07-30 ENCOUNTER — Other Ambulatory Visit: Payer: Self-pay

## 2021-07-30 ENCOUNTER — Other Ambulatory Visit
Admission: RE | Admit: 2021-07-30 | Discharge: 2021-07-30 | Disposition: A | Discharge status: Home / Self Care | Payer: MEDICAID | Source: Ambulatory Visit | Attending: Family Medicine | Admitting: Family Medicine

## 2021-07-30 DIAGNOSIS — Z711 Person with feared health complaint in whom no diagnosis is made: Secondary | ICD-10-CM | POA: Insufficient documentation

## 2021-07-30 DIAGNOSIS — Z7251 High risk heterosexual behavior: Secondary | ICD-10-CM | POA: Insufficient documentation

## 2021-07-30 DIAGNOSIS — N760 Acute vaginitis: Secondary | ICD-10-CM | POA: Insufficient documentation

## 2021-07-30 MED ORDER — METRONIDAZOLE 500 MG PO TABS *I*
500.0000 mg | ORAL_TABLET | Freq: Two times a day (BID) | ORAL | 0 refills | Status: DC
Start: 2021-07-30 — End: 2021-09-24
  Filled 2021-07-30: qty 14, 7d supply, fill #0

## 2021-07-30 MED ORDER — CLOTRIMAZOLE 1 % VA CREA *I*
1.0000 | TOPICAL_CREAM | Freq: Every day | VAGINAL | 0 refills | Status: DC
Start: 2021-07-30 — End: 2023-12-23
  Filled 2021-07-30: qty 45, 7d supply, fill #0

## 2021-07-31 ENCOUNTER — Other Ambulatory Visit: Payer: Self-pay

## 2021-07-31 LAB — VAGINITIS SCREEN: DNA PROBE: Vaginitis Screen:DNA Probe: 0

## 2021-07-31 LAB — CHLAMYDIA PLASMID DNA AMPLIFICATION: Chlamydia Plasmid DNA Amplification: 0

## 2021-07-31 LAB — N. GONORRHOEAE DNA AMPLIFICATION: N. gonorrhoeae DNA Amplification: 0

## 2021-08-06 ENCOUNTER — Other Ambulatory Visit: Payer: Self-pay

## 2021-08-08 ENCOUNTER — Other Ambulatory Visit: Payer: Self-pay

## 2021-08-08 MED ORDER — IBUPROFEN 400 MG PO TABS *I*
400.0000 mg | ORAL_TABLET | Freq: Four times a day (QID) | ORAL | 0 refills | Status: DC | PRN
Start: 2021-08-08 — End: 2021-08-14
  Filled 2021-08-08: qty 28, 7d supply, fill #0

## 2021-08-08 MED ORDER — ACETAMINOPHEN 500 MG PO TABS *I*
500.0000 mg | ORAL_TABLET | Freq: Four times a day (QID) | ORAL | 0 refills | Status: AC | PRN
Start: 2021-08-08 — End: ?
  Filled 2021-08-08: qty 28, 7d supply, fill #0

## 2021-08-13 ENCOUNTER — Other Ambulatory Visit: Payer: Self-pay

## 2021-08-14 ENCOUNTER — Other Ambulatory Visit: Payer: Self-pay

## 2021-08-14 MED ORDER — PENICILLIN V POTASSIUM 500 MG PO TABS *I*
500.0000 mg | ORAL_TABLET | Freq: Three times a day (TID) | ORAL | 0 refills | Status: DC
Start: 2021-08-14 — End: 2023-12-23
  Filled 2021-08-14: qty 21, 7d supply, fill #0

## 2021-08-14 MED ORDER — NAPROXEN 250 MG PO TABS *I*
250.0000 mg | ORAL_TABLET | Freq: Two times a day (BID) | ORAL | 0 refills | Status: DC
Start: 2021-08-14 — End: 2023-12-23
  Filled 2021-08-14: qty 10, 5d supply, fill #0

## 2021-08-16 NOTE — Progress Notes (Addendum)
Advanced Heart Failure Clinic Note    PCP: Patient, No Pcp Per (Inactive) PCP-Cardiologist: Dr. Shirlee Latch  HPI:  Robin Gallegos is a 30 y.o. female G1P1. Pregnancy in 2020 was complicated by preeclampsia and gestational diabetes. She presented at [redacted] weeks pregnant w/ SOB and dizziness on 08/11/19 and was admitted for preeclampsia. Delivered a healthy girl. Post delivery course was uncomplicated. She was discharged home on nifedipine for further BP management. Nifedipine ultimately discontinued at post-partum f/u by OB.  She presented to the [facility] ED in 10/20 w/ dyspnea and palpitations.  Echo in 10/20 showed reduced EF at 20%. HIV negative. No FH of cardiomyopathy or premature coronary disease.  No baseline coronary risk factors and no h/o chest pain. Slight hs-TnI elevation (18=>21) consistent with CHF. Not c/w SCAD and no clear RWMAs on echo. TSH normal. 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 She ws diuresed w/ IV Lasix w/ symptomatic improvement and improvement in volume. Diuresed to euvolemic state and placed on guideline-directed medical therapy. Started on daily PO lasix 40 mg daily. Pt counseled on the need for contraception while on cardiac meds, particularly Entresto. Discharge wt 191 lb.   Echo in 1/22 showed EF 35-40% with diffuse hypokinesis, normal RV size and systolic function, PASP 22, normal IVC.   Today she returns for HF follow up. Overall feeling fine. 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 She does not have exertional dyspnea. Denies CP, dizziness, edema, or PND/Orthopnea. Appetite ok. No fever or chills. Weight at home 211 pounds. Taking all medications.   ReDs 36%  Labs (11/21): K 4.1, creatinine 0.78 Labs (1/22): BNP 43 Labs (2/22): K 4.1, creatinine 0.83 Labs (5/22): K 4.0, creatinine 0.90  PMH: 1. 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   - Echo (10/20): EF 20%.  - Echo (4/21): EF 20-25%   - Echo (1/22): EF 35-40%, diffuse hypokinesis, normal RV, PASP 22, IVC normal.   - cMRI (6/22) LVEF 39%, out of range for ICD. No scar/infiltrative process.  2. Pre-eclampsia 3. Zio patch 12/21 with no significant arrhythmias.    Current Outpatient Medications  Medication Sig Dispense Refill   carvedilol (COREG) 25 MG tablet Take 1 tablet (25 mg total) by mouth 2 (two) times daily with a meal. 180 tablet 3   dapagliflozin propanediol (FARXIGA) 10 MG TABS tablet Take 10 mg by mouth daily before breakfast. 30 tablet 7   sacubitril-valsartan (ENTRESTO) 97-103 MG Take 1 tablet by mouth 2 (two) times daily. 180 tablet 3   spironolactone (ALDACTONE) 25 MG tablet Take 1 tablet (25 mg total) by mouth daily. 90 tablet 2   No current facility-administered medications for this encounter.   Allergies  Allergen Reactions   Penicillins Hives    Did it involve swelling of the face/tongue/throat, SOB, or low BP? Unknown Did it involve sudden or severe rash/hives, skin peeling, or any reaction on the inside of your mouth or nose? Unknown Did you need to seek medical attention at a hospital or doctor's office? Unknown When did it last happen?     pt was a child  If all above answers are "NO", may proceed with cephalosporin use.    Cleocin [Clindamycin] Rash   Social History   Socioeconomic History   Marital status: Single    Spouse name: Not on file   Number of children: Not on file   Years of education: Not on file   Highest education  level: Not on file  Occupational History   Not on file  Tobacco Use   Smoking status: Never   Smokeless tobacco: Never  Vaping Use   Vaping Use: Never used  Substance and Sexual Activity   Alcohol use: Not Currently   Drug use: Not Currently   Sexual activity: Yes  Other Topics Concern   Not on file  Social History Narrative   Not on file   Social Determinants of Health   Financial Resource Strain: Not on file  Food Insecurity: Not on file   Transportation Needs: Not on file  Physical Activity: Not on file  Stress: Not on file  Social Connections: Not on file  Intimate Partner Violence: Not on file   Family History  Problem Relation Age of Onset   Hypertension Mother    Heart failure Father    Hypertension Sister    Hypertension Sister    Hypertension Sister    BP 139/89   Pulse 81   Wt 96.3 kg (212 lb 3.2 oz)   SpO2 99%   BMI 36.42 kg/m   Wt Readings from Last 3 Encounters:  08/20/21 96.3 kg (212 lb 3.2 oz)  04/16/21 91.5 kg (201 lb 12.8 oz)  03/05/21 92.5 kg (204 lb)   PHYSICAL EXAM: General:  NAD. No resp difficulty HEENT: Normal Neck: Supple. JVP 7-8. Carotids 2+ bilat; no bruits. No lymphadenopathy or thryomegaly appreciated. Cor: PMI nondisplaced. Regular rate & rhythm. No rubs, gallops or murmurs. Lungs: Clear Abdomen: Obese, nontender, nondistended. No hepatosplenomegaly. No bruits or masses. Good bowel sounds. Extremities: No cyanosis, clubbing, rash, edema Neuro: Alert & oriented x 3, cranial nerves grossly intact. Moves all 4 extremities w/o difficulty. Affect pleasant.  ASSESSMENT & PLAN: 1. 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 Echo in 10/20 with EF 20% and diffuse hypokinesis.  RFs include African-American, gestational diabetes, and pre-eclampsia. She was HIV negative.  Father has CHF, she is not certain about details.  She does not drink ETOH or use drugs. No chest pain, CAD is unlikely.  TSH has been normal.  Echo in 1/22 showed EF up to 35-40% with normal RV.  cMRI (6/22) LVEF 39%, out of range for ICD. No scar/infiltrative process. She is mildly volume overloaded on exam and weight is up 10 lbs, ReDs 36%.  NYHA class I-II.  - Increase spironolacone to 50 mg daily. BMET today, repeat in 7 days, 4 weeks and 8 weeks. - Continue Entresto 97/103 bid.  - Continue Farxiga 10 mg daily.  No GU symptoms. - Continue Coreg 25 mg bid.   - She understands risk of fetal toxicity w/ HF  meds and also the risk of worsening HF if she were to get pregnant again.  She is contracepting with condoms. 2. Palpitations/Presyncope: 11/2020 Zio Patch showed no significant arrhythmias. No further events. 3. HTN: Elevated today.  - Increase spironolactone as above. - Consider sleep study.  Followup in 4 weeks with APP to reassess volume and in 4 months with Dr. Shirlee Latch.   Anderson Malta DeLisle, FNP 08/20/21

## 2021-08-20 ENCOUNTER — Encounter (HOSPITAL_COMMUNITY): Payer: Self-pay

## 2021-08-20 ENCOUNTER — Other Ambulatory Visit: Payer: Self-pay

## 2021-08-20 ENCOUNTER — Ambulatory Visit (HOSPITAL_COMMUNITY)
Admission: RE | Admit: 2021-08-20 | Discharge: 2021-08-20 | Disposition: A | Discharge status: Home / Self Care | Payer: No Typology Code available for payment source | Source: Ambulatory Visit | Attending: Family Medicine | Admitting: Family Medicine

## 2021-08-20 VITALS — BP 139/89 | HR 81 | Wt 212.2 lb

## 2021-08-20 DIAGNOSIS — Z8249 Family history of ischemic heart disease and other diseases of the circulatory system: Secondary | ICD-10-CM | POA: Insufficient documentation

## 2021-08-20 DIAGNOSIS — Z880 Allergy status to penicillin: Secondary | ICD-10-CM | POA: Diagnosis not present

## 2021-08-20 DIAGNOSIS — E1169 Type 2 diabetes mellitus with other specified complication: Secondary | ICD-10-CM

## 2021-08-20 DIAGNOSIS — R002 Palpitations: Secondary | ICD-10-CM | POA: Diagnosis not present

## 2021-08-20 DIAGNOSIS — E669 Obesity, unspecified: Secondary | ICD-10-CM | POA: Diagnosis not present

## 2021-08-20 DIAGNOSIS — I5022 Chronic systolic (congestive) heart failure: Secondary | ICD-10-CM | POA: Insufficient documentation

## 2021-08-20 DIAGNOSIS — I110 Hypertensive heart disease with heart failure: Secondary | ICD-10-CM | POA: Diagnosis not present

## 2021-08-20 DIAGNOSIS — R55 Syncope and collapse: Secondary | ICD-10-CM | POA: Diagnosis not present

## 2021-08-20 DIAGNOSIS — Z881 Allergy status to other antibiotic agents status: Secondary | ICD-10-CM | POA: Insufficient documentation

## 2021-08-20 DIAGNOSIS — Z7984 Long term (current) use of oral hypoglycemic drugs: Secondary | ICD-10-CM | POA: Insufficient documentation

## 2021-08-20 DIAGNOSIS — Z79899 Other long term (current) drug therapy: Secondary | ICD-10-CM | POA: Diagnosis not present

## 2021-08-20 DIAGNOSIS — I10 Essential (primary) hypertension: Secondary | ICD-10-CM | POA: Diagnosis not present

## 2021-08-20 LAB — BASIC METABOLIC PANEL
Anion gap: 4 — ABNORMAL LOW (ref 5–15)
BUN: 11 mg/dL (ref 6–20)
CO2: 26 mmol/L (ref 22–32)
Calcium: 8.8 mg/dL — ABNORMAL LOW (ref 8.9–10.3)
Chloride: 106 mmol/L (ref 98–111)
Creatinine, Ser: 0.79 mg/dL (ref 0.44–1.00)
GFR, Estimated: >60 mL/min (ref >60)
Glucose, Bld: 128 mg/dL — ABNORMAL HIGH (ref 70–99)
Potassium: 3.4 mmol/L — ABNORMAL LOW (ref 3.5–5.1)
Sodium: 136 mmol/L (ref 135–145)

## 2021-08-20 MED ORDER — DAPAGLIFLOZIN PROPANEDIOL 10 MG PO TABS: 10.0000 mg | ORAL_TABLET | Freq: Every day | ORAL | 5 refills | Status: DC

## 2021-08-20 MED ORDER — SPIRONOLACTONE 25 MG PO TABS: 50.0000 mg | ORAL_TABLET | Freq: Every day | ORAL | 2 refills | Status: DC

## 2021-08-20 NOTE — Patient Instructions (Addendum)
Labs were performed today, if labs are abnormal the clinic will call you  INCREASE Spirolactone to 50 mg daily  Your physician recommends that you schedule a follow-up appointment in: 4 weeks and then in 4 months  At the [facility], you and your health needs are our priority. As part of our continuing mission to provide you with exceptional heart care, we have created designated Provider Care Teams. These Care Teams include your primary Cardiologist (physician) and Advanced Practice Providers (APPs- Physician Assistants and Nurse Practitioners) who all work together to provide you with the care you need, when you need it.   You may see any of the following providers on your designated Care Team at your next follow up: Dr Arvilla Meres Dr Marca Ancona Dr Brandon Melnick, NP Robbie Lis, [place] Mikki Santee Karle Plumber, PharmD   Please be sure to bring in all your medications bottles to every appointment.    If you have any questions or concerns before your next appointment please send [place] a message through [place] or call our office at [phone].    TO LEAVE A MESSAGE FOR THE NURSE SELECT OPTION 2, PLEASE LEAVE A MESSAGE INCLUDING: YOUR NAME DATE OF BIRTH CALL BACK NUMBER REASON FOR CALL**this is important as we prioritize the call backs  YOU WILL RECEIVE A CALL BACK THE SAME DAY AS LONG AS YOU CALL BEFORE 4:00 PM.

## 2021-08-20 NOTE — Progress Notes (Signed)
ReDS Vest / Clip - 08/20/21 1000       ReDS Vest / Clip   Station Marker B    Ruler Value 33    ReDS Value Range Moderate volume overload    ReDS Actual Value 36

## 2021-08-21 IMAGING — MR MR CARD MORPHOLOGY WO/W CM
45 of 48 series · 45 of 48 positions shown · IV contrast (Contrast agent)
Comparison: none

CLINICAL DATA: Cardiomyopathy evaluation

EXAM:
CARDIAC MRI
TECHNIQUE: The patient was scanned on a 1.5 Tesla Siemens magnet. A dedicated
cardiac coil was used. Functional imaging was done using Fiesta
sequences. [DATE], and 4 chamber views were done to assess for RWMA's.
Modified Hiromoto rule using a short axis stack was used to
calculate an ejection fraction on a dedicated work station using
Circle software. The patient received 10 cc of Gadavist. After 10
minutes inversion recovery sequences were used to assess for
infiltration and scar tissue.
CONTRAST:  10 cc  of Gadavist

[Series 4: [id number] · axial · 8.0mm · 1.41mm/px · 1 of 16 slices shown]
[im 1/16]
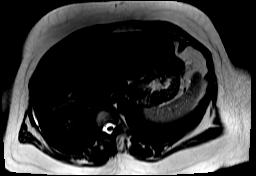

[Series 8: bSSFP · oblique · 8.0mm · 1.61mm/px · 1 of 25 slices shown (1 of 18)]
[im 1/25]
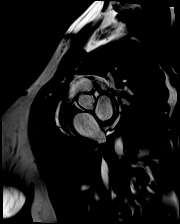

[Series 9: bSSFP · oblique · 8.0mm · 1.61mm/px · 1 of 25 slices shown (2 of 18)]
[im 1/25]
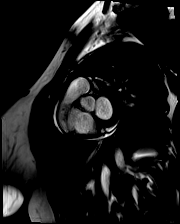

[Series 10: bSSFP · oblique · 8.0mm · 1.61mm/px · 1 of 25 slices shown (3 of 18)]
[im 1/25]
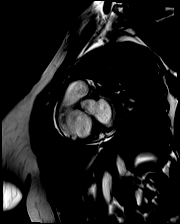

[Series 11: bSSFP · oblique · 8.0mm · 1.61mm/px · 1 of 25 slices shown (4 of 18)]
[im 1/25]
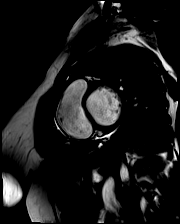

[Series 12: bSSFP · oblique · 8.0mm · 1.61mm/px · 1 of 25 slices shown (5 of 18)]
[im 1/25]
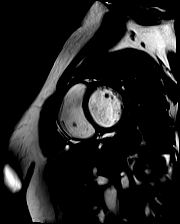

[Series 13: bSSFP · oblique · 8.0mm · 1.61mm/px · 1 of 25 slices shown (6 of 18)]
[im 1/25]
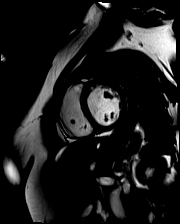

[Series 14: bSSFP · oblique · 8.0mm · 1.61mm/px · 1 of 25 slices shown (7 of 18)]
[im 1/25]
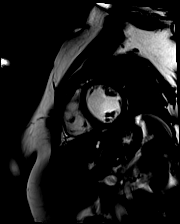

[Series 15: bSSFP · oblique · 8.0mm · 1.61mm/px · 1 of 25 slices shown (8 of 18)]
[im 1/25]
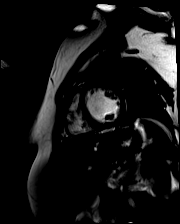

[Series 16: bSSFP · oblique · 8.0mm · 1.61mm/px · 1 of 25 slices shown (9 of 18)]
[im 1/25]
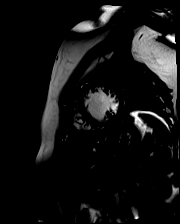

[Series 17: bSSFP · oblique · 8.0mm · 1.61mm/px · 1 of 25 slices shown (10 of 18)]
[im 1/25]
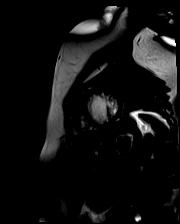

[Series 18: bSSFP · oblique · 8.0mm · 1.61mm/px · 1 of 25 slices shown (11 of 18)]
[im 1/25]
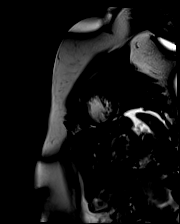

[Series 19: bSSFP · oblique · 8.0mm · 1.61mm/px · 1 of 25 slices shown (12 of 18)]
[im 1/25]
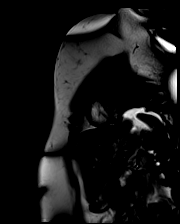

[Series 20: bSSFP · oblique · 8.0mm · 1.61mm/px · 1 of 25 slices shown (13 of 18)]
[im 1/25]
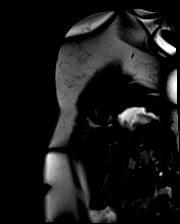

[Series 21: bSSFP · oblique · 8.0mm · 1.61mm/px · 1 of 25 slices shown (14 of 18)]
[im 1/25]
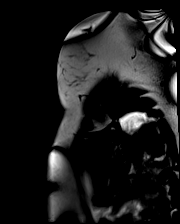

[Series 22: bSSFP · oblique · 8.0mm · 1.61mm/px · 1 of 25 slices shown (15 of 18)]
[im 1/25]
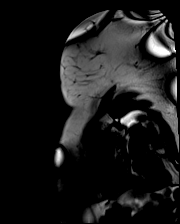

[Series 23: bSSFP · sagittal · 6.0mm · 1.41mm/px · 1 of 25 slices shown (16 of 18)]
[im 1/25]
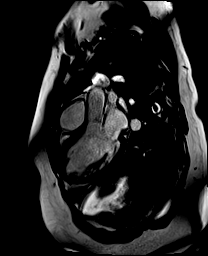

[Series 24: bSSFP · oblique · 6.0mm · 1.41mm/px · 1 of 25 slices shown (17 of 18)]
[im 1/25]
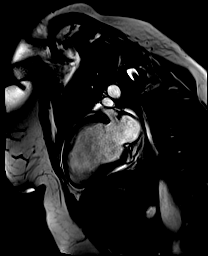

[Series 25: bSSFP · axial · 6.0mm · 1.41mm/px · 1 of 25 slices shown (18 of 18)]
[im 1/25]
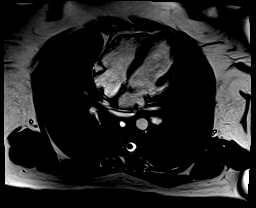

[Series 26: (id)_long_t1 · oblique · 8.0mm · 1.56mm/px · 1 of 24 slices shown]
[im 1/24]
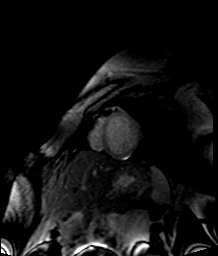

[Series 27: (id)_long_t1_moco · oblique · 8.0mm · 1.56mm/px · 1 of 24 slices shown]
[im 1/24]
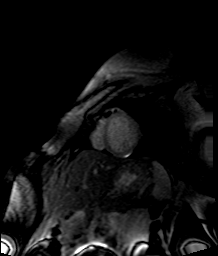

[Series 28: (id)_long_t1_moco_t1 · oblique · 8.0mm · 1.56mm/px · 1 of 6 slices shown]
[im 1/6]
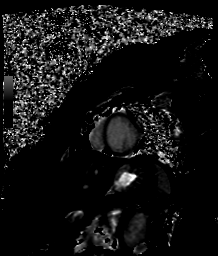

[Series 30: cine_trufi_cs_rt_short axis · oblique · 8.0mm · 1.73mm/px · 1 of 49 slices shown (1 of 16)]
[im 1/49]
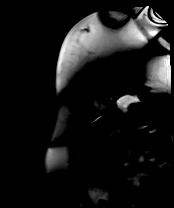

[Series 30: cine_trufi_cs_rt_short axis · oblique · 8.0mm · 1.73mm/px · 1 of 49 slices shown (2 of 16)]
[im 1/49]
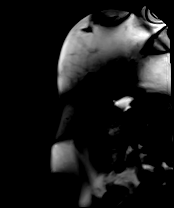

[Series 30: cine_trufi_cs_rt_short axis · oblique · 8.0mm · 1.73mm/px · 1 of 49 slices shown (3 of 16)]
[im 1/49]
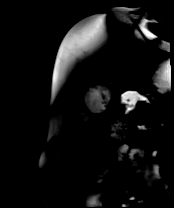

[Series 30: cine_trufi_cs_rt_short axis · oblique · 8.0mm · 1.73mm/px · 1 of 49 slices shown (4 of 16)]
[im 1/49]
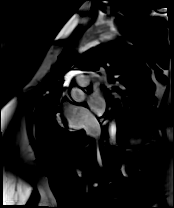

[Series 30: cine_trufi_cs_rt_short axis · oblique · 8.0mm · 1.73mm/px · 1 of 49 slices shown (5 of 16)]
[im 1/49]
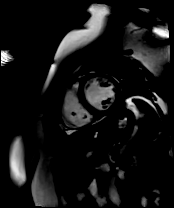

[Series 30: cine_trufi_cs_rt_short axis · oblique · 8.0mm · 1.73mm/px · 1 of 49 slices shown (6 of 16)]
[im 1/49]
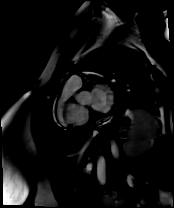

[Series 30: cine_trufi_cs_rt_short axis · oblique · 8.0mm · 1.73mm/px · 1 of 49 slices shown (7 of 16)]
[im 1/49]
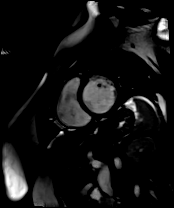

[Series 30: cine_trufi_cs_rt_short axis · oblique · 8.0mm · 1.73mm/px · 1 of 49 slices shown (8 of 16)]
[im 1/49]
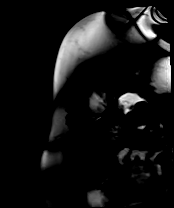

[Series 30: cine_trufi_cs_rt_short axis · oblique · 8.0mm · 1.73mm/px · 1 of 49 slices shown (9 of 16)]
[im 1/49]
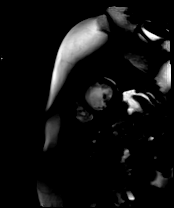

[Series 30: cine_trufi_cs_rt_short axis · oblique · 8.0mm · 1.73mm/px · 1 of 49 slices shown (10 of 16)]
[im 1/49]
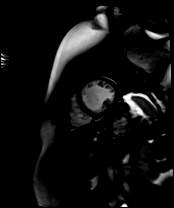

[Series 30: cine_trufi_cs_rt_short axis · oblique · 8.0mm · 1.73mm/px · 1 of 49 slices shown (11 of 16)]
[im 1/49]
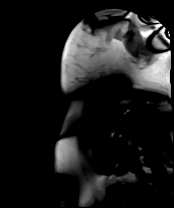

[Series 30: cine_trufi_cs_rt_short axis · oblique · 8.0mm · 1.73mm/px · 1 of 49 slices shown (12 of 16)]
[im 1/49]
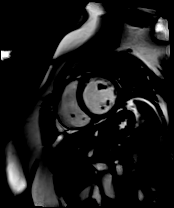

[Series 30: cine_trufi_cs_rt_short axis · oblique · 8.0mm · 1.73mm/px · 1 of 49 slices shown (13 of 16)]
[im 1/49]
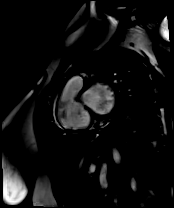

[Series 30: cine_trufi_cs_rt_short axis · oblique · 8.0mm · 1.73mm/px · 1 of 49 slices shown (14 of 16)]
[im 1/49]
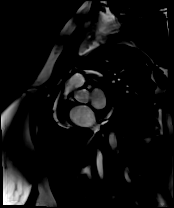

[Series 30: cine_trufi_cs_rt_short axis · oblique · 8.0mm · 1.73mm/px · 1 of 49 slices shown (15 of 16)]
[im 1/49]
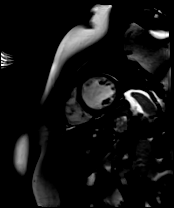

[Series 30: cine_trufi_cs_rt_short axis · oblique · 8.0mm · 1.73mm/px · 1 of 49 slices shown (16 of 16)]
[im 1/49]
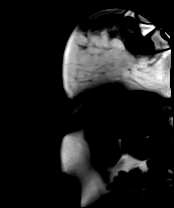

[Series 31: (id)_trufi · oblique · 8.0mm · 2.08mm/px · 1 of 9 slices shown]
[im 1/9]
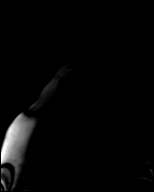

[Series 32: (id)_trufi_moco · oblique · 8.0mm · 2.08mm/px · 1 of 9 slices shown]
[im 1/9]
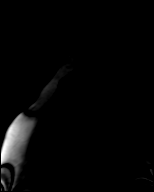

[Series 33: (id)_trufi_moco_t2 · oblique · 8.0mm · 2.08mm/px · 1 of 4 slices shown]
[im 1/4]
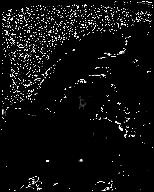

[Series 35: pre short axis · oblique · non-contrast · 8.0mm · 2.25mm/px · 1 of 10 slices shown (1 of 4)]
[im 1/10]
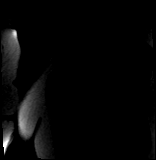

[Series 36: pre short axis · oblique · non-contrast · 8.0mm · 2.25mm/px · 1 of 10 slices shown (2 of 4)]
[im 1/10]
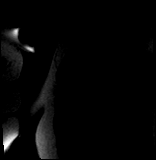

[Series 37: pre short axis · oblique · non-contrast · 8.0mm · 2.25mm/px · 1 of 10 slices shown (3 of 4)]
[im 1/10]
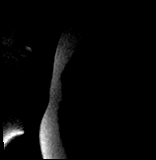

[Series 38: pre short axis · oblique · non-contrast · 8.0mm · 2.25mm/px · 1 of 10 slices shown (4 of 4)]
[im 1/10]
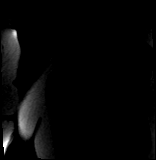

[45 of 48 positions shown; findings below may reference images not displayed]

FINDINGS: Left ventricle:

-Mild dilatation

-Moderate systolic dysfunction

-Normal ECV (25%)

-No LGE

LV EF:  39% (Normal 56-78%)

Absolute volumes:

LV EDV: 184mL (Normal 52-141 mL)

LV ESV: 113mL (Normal 13-51 mL)

LV SV: 71mL (Normal 33-97 mL)

CO: 4.5L/min (Normal 2.7-6.0 L/min)

Indexed volumes:

LV EDV: 90mL/sq-m (Normal 41-81 mL/sq-m)

LV ESV: 55mL/sq-m (Normal 12-21 mL/sq-m)

LV SV: 35mL/sq-m (Normal 26-56 mL/sq-m)

CI: 2.2L/min/sq-m (Normal 1.8-3.8 L/min/sq-m)

Right ventricle: Normal size and systolic function

RV EF: 49% (Normal 47-80%)

Absolute volumes:

RV EDV: 150mL (Normal 58-154 mL)

RV ESV: 77mL (Normal 12-68 mL)

RV SV: 73mL (Normal 35-98 mL)

CO: 4.6L/min (Normal 2.7-6 L/min)

Indexed volumes:

RV EDV: 74mL/sq-m (Normal 48-87 mL/sq-m)

RV ESV: 38mL/sq-m (Normal 11-28 mL/sq-m)

RV SV: 36mL/sq-m (Normal 27-57 mL/sq-m)

CI: 2.3L/min/sq-m (Normal 1.8-3.8 L/min/sq-m)

Left atrium: Normal size

Right atrium: Normal size

Mitral valve: No regurgitation

Aortic valve: No regurgitation

Tricuspid valve: Trivial regurgitation

Pulmonic valve: No regurgitation

Aorta: Normal proximal ascending aorta

Pericardium: Small effusion
IMPRESSION: 1.  Mild LV dilatation with moderate systolic dysfunction (EF 39%)

2.  Normal RV size and systolic function (EF 49%)

3.  No late gadolinium enhancement to suggest myocardial scar

4.  Small pericardial effusion

## 2021-08-28 ENCOUNTER — Other Ambulatory Visit: Payer: Self-pay

## 2021-08-28 ENCOUNTER — Ambulatory Visit (HOSPITAL_COMMUNITY)
Admission: RE | Admit: 2021-08-28 | Discharge: 2021-08-28 | Disposition: A | Discharge status: Home / Self Care | Payer: No Typology Code available for payment source | Source: Ambulatory Visit | Attending: Cardiology | Admitting: Cardiology

## 2021-08-28 DIAGNOSIS — I5022 Chronic systolic (congestive) heart failure: Secondary | ICD-10-CM | POA: Insufficient documentation

## 2021-08-28 LAB — BASIC METABOLIC PANEL
Anion gap: 6 (ref 5–15)
BUN: 10 mg/dL (ref 6–20)
CO2: 26 mmol/L (ref 22–32)
Calcium: 9.4 mg/dL (ref 8.9–10.3)
Chloride: 108 mmol/L (ref 98–111)
Creatinine, Ser: 0.87 mg/dL (ref 0.44–1.00)
GFR, Estimated: >60 mL/min (ref >60)
Glucose, Bld: 119 mg/dL — ABNORMAL HIGH (ref 70–99)
Potassium: 4.1 mmol/L (ref 3.5–5.1)
Sodium: 140 mmol/L (ref 135–145)

## 2021-09-16 NOTE — Progress Notes (Signed)
Advanced [facility] Failure Clinic Note    PCP: Patient, No Pcp Per (Inactive) PCP-Cardiologist: Dr. Shirlee Latch  HPI:  Robin Gallegos is a 30 y.o. female G1P1. Pregnancy in 2020 was complicated by preeclampsia and gestational diabetes. She presented at [redacted] weeks pregnant w/ SOB and dizziness on 08/11/19 and was admitted for preeclampsia. Delivered a healthy girl. Post delivery course was uncomplicated. She was discharged home on nifedipine for further BP management. Nifedipine ultimately discontinued at post-partum f/u by OB.  She presented to the [facility] ED in 10/20 w/ dyspnea and palpitations.  Echo in 10/20 showed reduced EF at 20%. HIV negative. No FH of cardiomyopathy or premature coronary disease.  No baseline coronary risk factors and no h/o chest pain. Slight hs-TnI elevation (18=>21) consistent with CHF. Not c/w SCAD and no clear RWMAs on echo. TSH normal. 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 She ws diuresed w/ IV Lasix w/ symptomatic improvement and improvement in volume. Diuresed to euvolemic state and placed on guideline-directed medical therapy. Started on daily PO lasix 40 mg daily. Pt counseled on the need for contraception while on cardiac meds, particularly Entresto. Discharge wt 191 lb.   Echo in 1/22 showed EF 35-40% with diffuse hypokinesis, normal RV size and systolic function, PASP 22, normal IVC.   Today she returns for HF follow up. She does not have significant exertional dyspnea. She works as a Merchandiser, retail at [employer]. Has some fatigue but attributes this to being the mom of an active two-year-old. Overall feeling fine. Denies CP, dizziness, edema, or PND/Orthopnea. Appetite ok. No fever or chills. Weight at home 210 pounds. She has been off of her Marcelline Deist for 1 month.  Labs (11/21): K 4.1, creatinine 0.78 Labs (1/22): BNP 43 Labs (2/22): K 4.1, creatinine 0.83 Labs (5/22): K 4.0, creatinine 0.90 Labs (9/22): K 4.0, creatinine 0.87  PMH: 1. 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   - Echo (10/20): EF 20%.  - Echo (4/21): EF 20-25%  - Echo (1/22): EF 35-40%, diffuse hypokinesis, normal RV, PASP 22, IVC normal.   - cMRI (6/22) LVEF 39%, out of range for ICD. No scar/infiltrative process.  2. Pre-eclampsia 3. Zio patch 12/21 with no significant arrhythmias.   Current Outpatient Medications  Medication Sig Dispense Refill   carvedilol (COREG) 25 MG tablet Take 1 tablet (25 mg total) by mouth 2 (two) times daily with a meal. 180 tablet 3   dapagliflozin propanediol (FARXIGA) 10 MG TABS tablet Take 1 tablet (10 mg total) by mouth daily before breakfast. 30 tablet 11   furosemide (LASIX) 20 MG tablet Take 1 tablet (20 mg total) by mouth as needed (3lb weight gain overnight or 5lb weightgain in a week). 30 tablet 11   sacubitril-valsartan (ENTRESTO) 97-103 MG Take 1 tablet by mouth 2 (two) times daily. 180 tablet 3   spironolactone (ALDACTONE) 25 MG tablet Take 2 tablets (50 mg total) by mouth daily. 90 tablet 2   No current facility-administered medications for this encounter.   Allergies  Allergen Reactions   Penicillins Hives    Did it involve swelling of the face/tongue/throat, SOB, or low BP? Unknown Did it involve sudden or severe rash/hives, skin peeling, or any reaction on the inside of your mouth or nose? Unknown Did you need to seek medical attention at a hospital or doctor's office? Unknown When did it last happen?     pt was a child  If all above answers are "NO", may proceed with  cephalosporin use.    Cleocin [Clindamycin] Rash   Social History   Socioeconomic History   Marital status: Single    Spouse name: Not on file   Number of children: Not on file   Years of education: Not on file   Highest education level: Not on file  Occupational History   Not on file  Tobacco Use   Smoking status: Never   Smokeless tobacco: Never  Vaping Use   Vaping Use: Never used  Substance and Sexual  Activity   Alcohol use: Not Currently   Drug use: Not Currently   Sexual activity: Yes  Other Topics Concern   Not on file  Social History Narrative   Not on file   Social Determinants of Health   Financial Resource Strain: Not on file  Food Insecurity: Not on file  Transportation Needs: Not on file  Physical Activity: Not on file  Stress: Not on file  Social Connections: Not on file  Intimate Partner Violence: Not on file   Family History  Problem Relation Age of Onset   Hypertension Mother    Heart failure Father    Hypertension Sister    Hypertension Sister    Hypertension Sister    BP (!) 139/98   Pulse 82   Wt 96 kg (211 lb 9.6 oz)   SpO2 100%   BMI 36.32 kg/m   Wt Readings from Last 3 Encounters:  09/17/21 96 kg (211 lb 9.6 oz)  08/20/21 96.3 kg (212 lb 3.2 oz)  04/16/21 91.5 kg (201 lb 12.8 oz)   PHYSICAL EXAM: General:  NAD. No resp difficulty HEENT: Normal Neck: Supple. No JVD. Carotids 2+ bilat; no bruits. No lymphadenopathy or thryomegaly appreciated. Cor: PMI nondisplaced. Regular rate & rhythm. No rubs, gallops or murmurs. Lungs: Clear Abdomen: Obese, nontender, nondistended. No hepatosplenomegaly. No bruits or masses. Good bowel sounds. Extremities: No cyanosis, clubbing, rash, edema Neuro: Alert & oriented x 3, cranial nerves grossly intact. Moves all 4 extremities w/o difficulty. Affect pleasant.  ASSESSMENT & PLAN: 1. 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 Echo in 10/20 with EF 20% and diffuse hypokinesis.  RFs include African-American, gestational diabetes, and pre-eclampsia. She was HIV negative.  Father has CHF, she is not certain about details.  She does not drink ETOH or use drugs. No chest pain, CAD is unlikely.  TSH has been normal.  Echo in 1/22 showed EF up to 35-40% with normal RV.  cMRI (6/22) LVEF 39%, out of range for ICD. No scar/infiltrative process. She does not appear volume overloaded today. NYHA class I-II.   - Restart Farxiga 10 mg daily. BMET today, repeat in 10 days. - I will give her PRN lasix 20 mg Rx for weight gain/edema. - Continue spironolacone 50 mg daily.  - Continue Entresto 97/103 mg bid.  - Continue Coreg 25 mg bid.   - She understands risk of fetal toxicity w/ HF meds and also the risk of worsening HF if she were to get pregnant again.  She is contracepting with condoms. 2. Palpitations/Presyncope: 11/2020 Zio Patch showed no significant arrhythmias. No further events. 3. HTN: Mildly elevated today.  - We discussed sleep study, she will think about this.   Followup in 3 months with Dr. Shirlee Latch + update echo.   Anderson [place], FNP 09/17/21

## 2021-09-17 ENCOUNTER — Other Ambulatory Visit: Payer: Self-pay

## 2021-09-17 ENCOUNTER — Encounter (HOSPITAL_COMMUNITY): Payer: Self-pay

## 2021-09-17 ENCOUNTER — Ambulatory Visit (HOSPITAL_COMMUNITY)
Admission: RE | Admit: 2021-09-17 | Discharge: 2021-09-17 | Disposition: A | Discharge status: Home / Self Care | Payer: No Typology Code available for payment source | Source: Ambulatory Visit | Attending: Family Medicine | Admitting: Family Medicine

## 2021-09-17 VITALS — BP 139/98 | HR 82 | Wt 211.6 lb

## 2021-09-17 DIAGNOSIS — Z79899 Other long term (current) drug therapy: Secondary | ICD-10-CM | POA: Insufficient documentation

## 2021-09-17 DIAGNOSIS — R002 Palpitations: Secondary | ICD-10-CM | POA: Insufficient documentation

## 2021-09-17 DIAGNOSIS — I10 Essential (primary) hypertension: Secondary | ICD-10-CM

## 2021-09-17 DIAGNOSIS — Z880 Allergy status to penicillin: Secondary | ICD-10-CM | POA: Insufficient documentation

## 2021-09-17 DIAGNOSIS — Z8249 Family history of ischemic heart disease and other diseases of the circulatory system: Secondary | ICD-10-CM | POA: Diagnosis not present

## 2021-09-17 DIAGNOSIS — Z8759 Personal history of other complications of pregnancy, childbirth and the puerperium: Secondary | ICD-10-CM | POA: Insufficient documentation

## 2021-09-17 DIAGNOSIS — I110 Hypertensive heart disease with heart failure: Secondary | ICD-10-CM | POA: Insufficient documentation

## 2021-09-17 DIAGNOSIS — Z881 Allergy status to other antibiotic agents status: Secondary | ICD-10-CM | POA: Diagnosis not present

## 2021-09-17 DIAGNOSIS — I5022 Chronic systolic (congestive) heart failure: Secondary | ICD-10-CM | POA: Insufficient documentation

## 2021-09-17 LAB — BASIC METABOLIC PANEL
Anion gap: 6 (ref 5–15)
BUN: 11 mg/dL (ref 6–20)
CO2: 26 mmol/L (ref 22–32)
Calcium: 9.5 mg/dL (ref 8.9–10.3)
Chloride: 105 mmol/L (ref 98–111)
Creatinine, Ser: 0.74 mg/dL (ref 0.44–1.00)
GFR, Estimated: >60 mL/min (ref >60)
Glucose, Bld: 108 mg/dL — ABNORMAL HIGH (ref 70–99)
Potassium: 4.4 mmol/L (ref 3.5–5.1)
Sodium: 137 mmol/L (ref 135–145)

## 2021-09-17 MED ORDER — DAPAGLIFLOZIN PROPANEDIOL 10 MG PO TABS: 10.0000 mg | ORAL_TABLET | Freq: Every day | ORAL | 11 refills | Status: DC

## 2021-09-17 MED ORDER — FUROSEMIDE 20 MG PO TABS: 20.0000 mg | ORAL_TABLET | ORAL | 11 refills | Status: AC | PRN

## 2021-09-17 NOTE — Patient Instructions (Signed)
RESTART Farxiga 10 mg, one tab daily START Lasix 20 mg, one tab daily as needed for 3 lb weight gain overnight or 5 lb weight gain in a week  Labs today We will only contact you if something comes back abnormal or we need to make some changes. Otherwise no news is good news!  Labs needed in 10 days  Your physician recommends that you schedule a follow-up appointment in: 3 months with Dr Shirlee Latch and echo  Your physician has requested that you have an echocardiogram. Echocardiography is a painless test that uses sound waves to create images of your heart. It provides your doctor with information about the size and shape of your heart and how well your heart's chambers and valves are working. This procedure takes approximately one hour. There are no restrictions for this procedure.  Do the following things EVERYDAY: Weigh yourself in the morning before breakfast. Write it down and keep it in a log. Take your medicines as prescribed Eat low salt foods--Limit salt (sodium) to 2000 mg per day.  Stay as active as you can everyday Limit all fluids for the day to less than 2 liters  At the [facility], you and your health needs are our priority. As part of our continuing mission to provide you with exceptional heart care, we have created designated Provider Care Teams. These Care Teams include your primary Cardiologist (physician) and Advanced Practice Providers (APPs- Physician Assistants and Nurse Practitioners) who all work together to provide you with the care you need, when you need it.   You may see any of the following providers on your designated Care Team at your next follow up: Dr Arvilla Meres Dr Marca Ancona Dr Brandon Melnick, NP Robbie Lis, [place] Mikki Santee Karle Plumber, PharmD   Please be sure to bring in all your medications bottles to every appointment.

## 2021-09-23 ENCOUNTER — Other Ambulatory Visit
Admission: RE | Admit: 2021-09-23 | Discharge: 2021-09-23 | Disposition: A | Discharge status: Home / Self Care | Payer: MEDICAID | Source: Ambulatory Visit | Attending: Medical | Admitting: Medical

## 2021-09-23 DIAGNOSIS — Z124 Encounter for screening for malignant neoplasm of cervix: Secondary | ICD-10-CM | POA: Insufficient documentation

## 2021-09-24 ENCOUNTER — Other Ambulatory Visit: Payer: Self-pay

## 2021-09-24 MED ORDER — METRONIDAZOLE 500 MG PO TABS *I*
500.0000 mg | ORAL_TABLET | Freq: Two times a day (BID) | ORAL | 0 refills | Status: DC
Start: 2021-09-23 — End: 2021-11-22
  Filled 2021-09-24: qty 14, 7d supply, fill #0

## 2021-09-24 MED ORDER — FLUCONAZOLE 150 MG PO TABS *I*
150.0000 mg | ORAL_TABLET | ORAL | 0 refills | Status: DC
Start: 2021-09-23 — End: 2021-11-19
  Filled 2021-09-24: qty 2, 6d supply, fill #0

## 2021-09-27 ENCOUNTER — Telehealth (HOSPITAL_COMMUNITY): Payer: Self-pay | Admitting: Cardiology

## 2021-09-27 ENCOUNTER — Ambulatory Visit (HOSPITAL_COMMUNITY)
Admission: RE | Admit: 2021-09-27 | Discharge: 2021-09-27 | Disposition: A | Discharge status: Home / Self Care | Payer: No Typology Code available for payment source | Source: Ambulatory Visit | Attending: Internal Medicine | Admitting: Internal Medicine

## 2021-09-27 ENCOUNTER — Other Ambulatory Visit: Payer: Self-pay

## 2021-09-27 DIAGNOSIS — I5022 Chronic systolic (congestive) heart failure: Secondary | ICD-10-CM | POA: Diagnosis present

## 2021-09-27 LAB — BASIC METABOLIC PANEL
Anion gap: 7 (ref 5–15)
BUN: 8 mg/dL (ref 6–20)
CO2: 25 mmol/L (ref 22–32)
Calcium: 8.9 mg/dL (ref 8.9–10.3)
Chloride: 105 mmol/L (ref 98–111)
Creatinine, Ser: 0.81 mg/dL (ref 0.44–1.00)
GFR, Estimated: >60 mL/min (ref >60)
Glucose, Bld: 104 mg/dL — ABNORMAL HIGH (ref 70–99)
Potassium: 4.1 mmol/L (ref 3.5–5.1)
Sodium: 137 mmol/L (ref 135–145)

## 2021-09-27 LAB — CHLAMYDIA NAAT (PCR): Chlamydia plasmid DNA AMplification: 0

## 2021-09-27 LAB — TRICHOMONAS NAAT (PCR): Trichomonas DNA Amplification: 0

## 2021-09-27 LAB — N. GONORRHOEAE NAAT (PCR): N. Gonorrhoeae DNA Amplification: 0

## 2021-10-02 LAB — GYN CYTOLOGY

## 2021-11-19 ENCOUNTER — Other Ambulatory Visit: Payer: Self-pay | Admitting: Gastroenterology

## 2021-11-19 ENCOUNTER — Other Ambulatory Visit: Payer: Self-pay

## 2021-11-19 ENCOUNTER — Other Ambulatory Visit
Admission: RE | Admit: 2021-11-19 | Discharge: 2021-11-19 | Disposition: A | Discharge status: Home / Self Care | Payer: MEDICAID | Source: Ambulatory Visit

## 2021-11-19 ENCOUNTER — Inpatient Hospital Stay: Admission: RE | Admit: 2021-11-19 | Payer: Self-pay | Source: Ambulatory Visit

## 2021-11-19 ENCOUNTER — Other Ambulatory Visit: Payer: Self-pay | Admitting: Obstetrics & Gynecology

## 2021-11-19 ENCOUNTER — Other Ambulatory Visit
Admission: RE | Admit: 2021-11-19 | Discharge: 2021-11-19 | Disposition: A | Discharge status: Home / Self Care | Payer: MEDICAID | Source: Ambulatory Visit | Attending: Obstetrics & Gynecology | Admitting: Obstetrics & Gynecology

## 2021-11-19 DIAGNOSIS — Z113 Encounter for screening for infections with a predominantly sexual mode of transmission: Secondary | ICD-10-CM | POA: Insufficient documentation

## 2021-11-19 DIAGNOSIS — Z7251 High risk heterosexual behavior: Secondary | ICD-10-CM | POA: Insufficient documentation

## 2021-11-19 DIAGNOSIS — Z118 Encounter for screening for other infectious and parasitic diseases: Secondary | ICD-10-CM | POA: Insufficient documentation

## 2021-11-19 MED ORDER — PROBIOTIC (LACTOBACILLUS) PO CAPS
1.0000 | ORAL_CAPSULE | Freq: Every day | ORAL | 6 refills | Status: DC
Start: 2021-11-19 — End: 2023-12-23
  Filled 2021-11-19: qty 30, fill #0

## 2021-11-19 MED ORDER — FLUCONAZOLE 150 MG PO TABS *I*
150.0000 mg | ORAL_TABLET | ORAL | 3 refills | Status: DC
Start: 2021-11-19 — End: 2022-01-22
  Filled 2021-11-19: qty 2, 6d supply, fill #0
  Filled 2021-12-11: qty 2, 6d supply, fill #1

## 2021-11-19 MED ORDER — TRIAMCINOLONE ACETONIDE 0.1 % EX OINT *I*
TOPICAL_OINTMENT | CUTANEOUS | 0 refills | Status: DC
Start: 2021-11-19 — End: 2023-12-23
  Filled 2021-11-19: qty 30, 30d supply, fill #0

## 2021-11-19 MED ORDER — NYSTATIN 100000 UNIT/GM EX OINT *I*
TOPICAL_OINTMENT | Freq: Two times a day (BID) | CUTANEOUS | 0 refills | Status: AC
Start: 2021-11-19 — End: 2021-12-22
  Filled 2021-11-19: qty 30, 30d supply, fill #0

## 2021-11-20 ENCOUNTER — Other Ambulatory Visit: Payer: Self-pay

## 2021-11-21 ENCOUNTER — Other Ambulatory Visit: Payer: Self-pay

## 2021-11-21 LAB — TRICHOMONAS NAAT (PCR): Trichomonas DNA amplification: 0

## 2021-11-21 LAB — CHLAMYDIA NAAT (PCR): Chlamydia Plasmid DNA Amplification: 0

## 2021-11-21 LAB — N. GONORRHOEAE NAAT (PCR): N. gonorrhoeae DNA Amplification: 0

## 2021-11-22 ENCOUNTER — Other Ambulatory Visit: Payer: Self-pay

## 2021-11-22 ENCOUNTER — Other Ambulatory Visit
Admission: RE | Admit: 2021-11-22 | Discharge: 2021-11-22 | Disposition: A | Discharge status: Home / Self Care | Payer: MEDICAID | Source: Ambulatory Visit

## 2021-11-22 MED ORDER — FLUCONAZOLE 150 MG PO TABS *I*
150.0000 mg | ORAL_TABLET | ORAL | 0 refills | Status: DC
Start: 2021-11-22 — End: 2022-01-22

## 2021-11-22 MED ORDER — METRONIDAZOLE 500 MG PO TABS *I*
500.0000 mg | ORAL_TABLET | Freq: Two times a day (BID) | ORAL | 0 refills | Status: DC
Start: 2021-11-22 — End: 2022-03-05
  Filled 2021-11-22: qty 14, 7d supply, fill #0

## 2021-11-25 ENCOUNTER — Other Ambulatory Visit: Payer: Self-pay

## 2021-11-28 ENCOUNTER — Other Ambulatory Visit: Payer: Self-pay

## 2021-11-29 ENCOUNTER — Other Ambulatory Visit: Payer: Self-pay

## 2021-12-11 ENCOUNTER — Other Ambulatory Visit: Payer: Self-pay

## 2021-12-18 ENCOUNTER — Ambulatory Visit (HOSPITAL_COMMUNITY)
Admission: RE | Admit: 2021-12-18 | Discharge: 2021-12-18 | Disposition: A | Discharge status: Home / Self Care | Payer: No Typology Code available for payment source | Source: Ambulatory Visit | Attending: Cardiology | Admitting: Cardiology

## 2021-12-18 ENCOUNTER — Encounter (HOSPITAL_COMMUNITY): Payer: Self-pay | Admitting: Cardiology

## 2021-12-18 ENCOUNTER — Ambulatory Visit (INDEPENDENT_AMBULATORY_CARE_PROVIDER_SITE_OTHER)
Admission: RE | Admit: 2021-12-18 | Discharge: 2021-12-18 | Disposition: A | Discharge status: Home / Self Care | Payer: No Typology Code available for payment source | Source: Ambulatory Visit | Attending: Cardiology | Admitting: Cardiology

## 2021-12-18 ENCOUNTER — Other Ambulatory Visit: Payer: Self-pay

## 2021-12-18 VITALS — BP 124/88 | HR 90 | Wt 210.6 lb

## 2021-12-18 DIAGNOSIS — I5022 Chronic systolic (congestive) heart failure: Secondary | ICD-10-CM | POA: Insufficient documentation

## 2021-12-18 DIAGNOSIS — R55 Syncope and collapse: Secondary | ICD-10-CM | POA: Insufficient documentation

## 2021-12-18 DIAGNOSIS — Z8249 Family history of ischemic heart disease and other diseases of the circulatory system: Secondary | ICD-10-CM | POA: Diagnosis not present

## 2021-12-18 DIAGNOSIS — E010 Iodine-deficiency related diffuse (endemic) goiter: Secondary | ICD-10-CM | POA: Diagnosis not present

## 2021-12-18 DIAGNOSIS — Z79899 Other long term (current) drug therapy: Secondary | ICD-10-CM | POA: Insufficient documentation

## 2021-12-18 DIAGNOSIS — Z7984 Long term (current) use of oral hypoglycemic drugs: Secondary | ICD-10-CM | POA: Insufficient documentation

## 2021-12-18 DIAGNOSIS — R002 Palpitations: Secondary | ICD-10-CM | POA: Diagnosis not present

## 2021-12-18 DIAGNOSIS — I429 Cardiomyopathy, unspecified: Secondary | ICD-10-CM | POA: Insufficient documentation

## 2021-12-18 LAB — BASIC METABOLIC PANEL
Anion gap: 4 — ABNORMAL LOW (ref 5–15)
BUN: 12 mg/dL (ref 6–20)
CO2: 26 mmol/L (ref 22–32)
Calcium: 9.1 mg/dL (ref 8.9–10.3)
Chloride: 106 mmol/L (ref 98–111)
Creatinine, Ser: 0.78 mg/dL (ref 0.44–1.00)
GFR, Estimated: >60 mL/min (ref >60)
Glucose, Bld: 105 mg/dL — ABNORMAL HIGH (ref 70–99)
Potassium: 4.4 mmol/L (ref 3.5–5.1)
Sodium: 136 mmol/L (ref 135–145)

## 2021-12-18 LAB — ECHOCARDIOGRAM COMPLETE
AR max vel: 2.86 cm2
AV Peak grad: 7.5 mmHg
Ao pk vel: 1.37 m/s
Area-P 1/2: 3.63 cm2
Calc EF: 38.5 %
S' Lateral: 4.1 cm
Single Plane A2C EF: 40.6 %
Single Plane A4C EF: 36.9 %

## 2021-12-18 LAB — TSH: TSH: 1.031 u[IU]/mL (ref 0.350–4.500)

## 2021-12-18 MED ORDER — ISOSORB DINITRATE-HYDRALAZINE 20-37.5 MG PO TABS: ORAL_TABLET | ORAL | 3 refills | Status: DC

## 2021-12-18 NOTE — Progress Notes (Signed)
Advanced Heart Failure Clinic Note    PCP: Patient, No Pcp Per (Inactive) PCP-Cardiologist: Dr. Shirlee Latch  HPI:  Robin Gallegos is a 31 y.o. female G1P1. Pregnancy in 2020 was complicated by preeclampsia and gestational diabetes. She presented at [redacted] weeks pregnant w/ SOB and dizziness on 08/11/19 and was admitted for preeclampsia. Delivered a healthy girl. Post delivery course was uncomplicated. She was discharged home on nifedipine for further BP management. Nifedipine ultimately discontinued at post-partum f/u by OB.  She presented to the [facility] ED in 10/20 w/ dyspnea and palpitations.  Echo in 10/20 showed reduced EF at 20%. HIV negative. No FH of cardiomyopathy or premature coronary disease.  No baseline coronary risk factors and no h/o chest pain. Slight hs-TnI elevation (18=>21) consistent with CHF. Not c/w SCAD and no clear RWMAs on echo. TSH normal. 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 She ws diuresed w/ IV Lasix w/ symptomatic improvement and improvement in volume. Diuresed to euvolemic state and placed on guideline-directed medical therapy. Started on daily PO lasix 40 mg daily. Pt counseled on the need for contraception while on cardiac meds, particularly Entresto. Discharge wt 191 lb.   Echo in 1/22 showed EF 35-40% with diffuse hypokinesis, normal RV size and systolic function, PASP 22, normal IVC.   Echo was done today and reviewed, EF 40-45%, mild LV dilation, normal RV.   She returns for followup of CHF.  She is working as a Merchandiser, retail at [employer]. She uses contraception and understands the risks of pregnancy.  Weight is down 1 lb.  No significant exertional dyspnea.  No lightheadedness.  No chest pain.    Labs (11/21): K 4.1, creatinine 0.78 Labs (1/22): BNP 43 Labs (2/22): K 4.1, creatinine 0.83 Labs (10/22): K 4.1, creatinine 0.81  PMH: 1. 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   - Echo (10/20): EF 20%.  - Echo  (4/21): EF 20-25%  - Echo (1/22): EF 35-40%, diffuse hypokinesis, normal RV, PASP 22, IVC normal.  - Cardiac MRI (6/22): EF 39%, RV EF 49%, no LGE.  - Echo (1/23): EF 40-45%, mild LV dilation, normal RV 2. Pre-eclampsia 3. Zio patch 12/21 with no significant arrhythmias.    Current Outpatient Medications  Medication Sig Dispense Refill   carvedilol (COREG) 25 MG tablet Take 1 tablet (25 mg total) by mouth 2 (two) times daily with a meal. 180 tablet 3   dapagliflozin propanediol (FARXIGA) 10 MG TABS tablet Take 1 tablet (10 mg total) by mouth daily before breakfast. 30 tablet 11   furosemide (LASIX) 20 MG tablet Take 1 tablet (20 mg total) by mouth as needed (3lb weight gain overnight or 5lb weightgain in a week). 30 tablet 11   isosorbide-hydrALAZINE (BIDIL) 20-37.5 MG tablet Take 1/2 tablet by mouth 3 times daily. 135 tablet 3   sacubitril-valsartan (ENTRESTO) 97-103 MG Take 1 tablet by mouth 2 (two) times daily. 180 tablet 3   spironolactone (ALDACTONE) 25 MG tablet Take 2 tablets (50 mg total) by mouth daily. 90 tablet 2   No current facility-administered medications for this encounter.    Allergies  Allergen Reactions   Penicillins Hives    Did it involve swelling of the face/tongue/throat, SOB, or low BP? Unknown Did it involve sudden or severe rash/hives, skin peeling, or any reaction on the inside of your mouth or nose? Unknown Did you need to seek medical attention at a hospital or doctor's office? Unknown When did it last happen?  pt was a child  If all above answers are NO, may proceed with cephalosporin use.    Cleocin [Clindamycin] Rash      Social History   Socioeconomic History   Marital status: Single    Spouse name: Not on file   Number of children: Not on file   Years of education: Not on file   Highest education level: Not on file  Occupational History   Not on file  Tobacco Use   Smoking status: Never   Smokeless tobacco: Never  Vaping Use    Vaping Use: Never used  Substance and Sexual Activity   Alcohol use: Not Currently   Drug use: Not Currently   Sexual activity: Yes  Other Topics Concern   Not on file  Social History Narrative   Not on file   Social Determinants of Health   Financial Resource Strain: Not on file  Food Insecurity: Not on file  Transportation Needs: Not on file  Physical Activity: Not on file  Stress: Not on file  Social Connections: Not on file  Intimate Partner Violence: Not on file      Family History  Problem Relation Age of Onset   Hypertension Mother    Heart failure Father    Hypertension Sister    Hypertension Sister    Hypertension Sister     Vitals:   12/18/21 1114  BP: 124/88  Pulse: 90  SpO2: 100%  Weight: 95.5 kg (210 lb 9.6 oz)   Wt Readings from Last 3 Encounters:  12/18/21 95.5 kg (210 lb 9.6 oz)  09/17/21 96 kg (211 lb 9.6 oz)  08/20/21 96.3 kg (212 lb 3.2 oz)    PHYSICAL EXAM: General: NAD Neck: No JVD, mild thyromegaly.  Lungs: Clear to auscultation bilaterally with normal respiratory effort. CV: Nondisplaced PMI.  Heart regular S1/S2, no S3/S4, no murmur.  No peripheral edema.  No carotid bruit.  Normal pedal pulses.  Abdomen: Soft, nontender, no hepatosplenomegaly, no distention.  Skin: Intact without lesions or rashes.  Neurologic: Alert and oriented x 3.  Psych: Normal affect. Extremities: No clubbing or cyanosis.  HEENT: Normal.  ASSESSMENT & PLAN:  1. 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 Echo in 10/20 with EF 20% and diffuse hypokinesis.  RFs include African-American, gestational diabetes, and pre-eclampsia. She was HIV negative.  Father has CHF, she is not certain about details.  She does not drink ETOH or use drugs. No chest pain, CAD is unlikely.  TSH has been normal.  Echo in 1/22 showed EF up to 35-40% with normal RV.  Cardiac MRI in 6/22 showed EF 39%, no LGE. Echo in 1/23 with EF 40-45%.  She is not volume overloaded on  exam. NYHA class I-II. - EF is out of ICD range.  - Continue Entresto 97/103 bid. BMET today.  - Continue Farxiga 10 mg daily.  - Continue spironolactone 25 mg daily.   - Continue Coreg 25 mg bid.   - Add Bidil 1/2 tab tid.  - She understands risk of fetal toxicity w/ HF meds and also the risk of worsening HF if she were to get pregnant again.  She is using contraception.  2. Palpitations/Presyncope: 11/2020 Zio Patch showed no significant arrhythmias.  3. Thyromegaly: Noted on exam.  - Check TSH.   Followup in 2 months with APP, increase Bidil then if BP stable.   Marca Ancona, MD 12/18/21

## 2021-12-18 NOTE — Progress Notes (Signed)
°  Echocardiogram 2D Echocardiogram has been performed.  Robin Gallegos 12/18/2021, 10:37 AM

## 2021-12-18 NOTE — Patient Instructions (Addendum)
Labs done today. We will contact you only if your labs are abnormal.  START Bidil 1/2 tablet by mouth 3 times daily.   No other medication changes were made. Please continue all current medications as prescribed.  Your physician recommends that you schedule a follow-up appointment in: 2 months with our NP/PA Clinic here in our office.   If you have any questions or concerns before your next appointment please send [place] a message through [place] or call our office at [phone].    TO LEAVE A MESSAGE FOR THE NURSE SELECT OPTION 2, PLEASE LEAVE A MESSAGE INCLUDING: YOUR NAME DATE OF BIRTH CALL BACK NUMBER REASON FOR CALL**this is important as we prioritize the call backs  YOU WILL RECEIVE A CALL BACK THE SAME DAY AS LONG AS YOU CALL BEFORE 4:00 PM   Do the following things EVERYDAY: Weigh yourself in the morning before breakfast. Write it down and keep it in a log. Take your medicines as prescribed Eat low salt foods--Limit salt (sodium) to 2000 mg per day.  Stay as active as you can everyday Limit all fluids for the day to less than 2 liters   At the [facility], you and your health needs are our priority. As part of our continuing mission to provide you with exceptional heart care, we have created designated Provider Care Teams. These Care Teams include your primary Cardiologist (physician) and Advanced Practice Providers (APPs- Physician Assistants and Nurse Practitioners) who all work together to provide you with the care you need, when you need it.   You may see any of the following providers on your designated Care Team at your next follow up: Dr Arvilla Meres Dr Carron Curie, NP Robbie Lis, [place] Karle Plumber, PharmD   Please be sure to bring in all your medications bottles to every appointment.

## 2022-01-22 ENCOUNTER — Other Ambulatory Visit: Payer: Self-pay

## 2022-01-22 MED ORDER — NORETHIN ACE-ETH ESTRAD-FE 1-20 MG-MCG PO TABS *A*
1.0000 | ORAL_TABLET | Freq: Every day | ORAL | 5 refills | Status: DC
Start: 2022-01-22 — End: 2022-07-24
  Filled 2022-01-22: qty 28, 28d supply, fill #0

## 2022-01-22 MED ORDER — FLUCONAZOLE 150 MG PO TABS *I*
150.0000 mg | ORAL_TABLET | ORAL | 0 refills | Status: DC
Start: 2022-01-22 — End: 2022-01-30
  Filled 2022-01-22: qty 2, 6d supply, fill #0

## 2022-01-23 ENCOUNTER — Other Ambulatory Visit: Payer: Self-pay

## 2022-01-29 ENCOUNTER — Other Ambulatory Visit
Admission: RE | Admit: 2022-01-29 | Discharge: 2022-01-29 | Disposition: A | Discharge status: Home / Self Care | Payer: MEDICAID | Source: Ambulatory Visit | Attending: Physician Assistant | Admitting: Physician Assistant

## 2022-01-29 DIAGNOSIS — R300 Dysuria: Secondary | ICD-10-CM | POA: Insufficient documentation

## 2022-01-30 ENCOUNTER — Other Ambulatory Visit: Payer: Self-pay

## 2022-01-30 LAB — TRICHOMONAS NAAT (PCR): Trichomonas DNA amplification: 0

## 2022-01-30 LAB — CHLAMYDIA NAAT (PCR): Chlamydia Plasmid DNA Amplification: 0

## 2022-01-30 LAB — N. GONORRHOEAE NAAT (PCR): N. gonorrhoeae DNA Amplification: 0

## 2022-01-30 MED ORDER — FLUCONAZOLE 150 MG PO TABS *I*
150.0000 mg | ORAL_TABLET | ORAL | 0 refills | Status: DC
Start: 2022-01-29 — End: 2022-03-05
  Filled 2022-01-30: qty 1, 1d supply, fill #0

## 2022-01-30 MED ORDER — NITROFURANTOIN MONOHYD MACRO 100 MG PO CAPS *I*
100.0000 mg | ORAL_CAPSULE | Freq: Two times a day (BID) | ORAL | 0 refills | Status: AC
Start: 2022-01-29 — End: 2022-02-10
  Filled 2022-01-30: qty 20, 10d supply, fill #0

## 2022-01-31 ENCOUNTER — Other Ambulatory Visit: Payer: Self-pay

## 2022-02-01 LAB — AEROBIC CULTURE

## 2022-02-14 NOTE — Progress Notes (Signed)
?Advanced Heart Failure Clinic Note  ? ? ?PCP: Patient, No Pcp Per (Inactive) ?HF Cardiologist: Dr. Aundra Dubin ? ?HPI:   ?[name] is a 31 y.o. female G1P1. Pregnancy in [id number] was complicated by preeclampsia and gestational diabetes. She presented at [redacted] weeks pregnant w/ SOB and dizziness on 08/11/19 and was admitted for preeclampsia. Delivered a healthy girl. Post delivery course was uncomplicated. She was discharged home on nifedipine for further BP management. Nifedipine ultimately discontinued at post-partum f/u by OB.  She presented to the [facility] ED in 10/20 w/ dyspnea and palpitations.  Echo in 10/20 showed reduced EF at 20%. HIV negative. No FH of cardiomyopathy or premature coronary disease.  No baseline coronary risk factors and no h/o chest pain. Slight hs-TnI elevation (18=>21) consistent with CHF. Not c/w SCAD and no clear RWMAs on echo. TSH normal. 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 She ws diuresed w/ IV Lasix w/ symptomatic improvement and improvement in volume. Diuresed to euvolemic state and placed on guideline-directed medical therapy. Started on daily PO lasix 40 mg daily. Pt counseled on the need for contraception while on cardiac meds, particularly Entresto. Discharge wt 191 lb.  ? ?Echo in 1/22 showed EF 35-40% with diffuse hypokinesis, normal RV size and systolic function, PASP 22, normal IVC.  ? ?Echo 1/23 EF 40-45%, mild LV dilation, normal RV.  ? ?Today she returns for HF follow up. Overall feeling fine. She works at [employer] as a Librarian, academic. Has occasional palpitations but not bothersome. She is not SOB with work duties, stairs or walking on flat ground. Denies abnormal bleeding, CP, dizziness, edema, or PND/Orthopnea. Appetite ok. No fever or chills. Weight at home 203 pounds. Taking all medications. BP check at home 120/70. Has not had to take PRN Lasix. Has 58 year old daughter, contracepting with condoms.   ? ?ECG (personally reviewed): NSR 81 bpm ? ?Labs (11/21): K 4.1, creatinine  0.78 ?Labs (1/22): BNP 43 ?Labs (2/22): K 4.1, creatinine 0.83 ?Labs (10/22): K 4.1, creatinine 0.81 ?Labs (1/23): K 4.4, creatinine 0.78 ? ?PMH: ?1. 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   ?- Echo (10/20): EF 20%.  ?- Echo (4/21): EF 20-25%  ?- Echo (1/22): EF 35-40%, diffuse hypokinesis, normal RV, PASP 22, IVC normal.  ?- Cardiac MRI (6/22): EF 39%, RV EF 49%, no LGE.  ?- Echo (1/23): EF 40-45%, mild LV dilation, normal RV ?2. Pre-eclampsia ?3. Zio patch 12/21 with no significant arrhythmias.  ? ?Current Outpatient Medications  ?Medication Sig Dispense Refill  ? carvedilol (COREG) 25 MG tablet Take 1 tablet (25 mg total) by mouth 2 (two) times daily with a meal. 180 tablet 3  ? dapagliflozin propanediol (FARXIGA) 10 MG TABS tablet Take 1 tablet (10 mg total) by mouth daily before breakfast. 30 tablet 11  ? furosemide (LASIX) 20 MG tablet Take 1 tablet (20 mg total) by mouth as needed (3lb weight gain overnight or 5lb weightgain in a week). 30 tablet 11  ? isosorbide-hydrALAZINE (BIDIL) 20-37.5 MG tablet Take 1/2 tablet by mouth 3 times daily. 135 tablet 3  ? sacubitril-valsartan (ENTRESTO) 97-103 MG Take 1 tablet by mouth 2 (two) times daily. 180 tablet 3  ? spironolactone (ALDACTONE) 25 MG tablet Take 2 tablets (50 mg total) by mouth daily. 90 tablet 2  ? ?No current facility-administered medications for this encounter.  ? ? ?Allergies  ?Allergen Reactions  ? Penicillins Hives  ?  Did it involve swelling of the face/tongue/throat, SOB, or  low BP? Unknown ?Did it involve sudden or severe rash/hives, skin peeling, or any reaction on the inside of your mouth or nose? Unknown ?Did you need to seek medical attention at a hospital or doctor's office? Unknown ?When did it last happen?     pt was a child  ?If all above answers are ?NO?, may proceed with cephalosporin use. ?  ? Cleocin [Clindamycin] Rash  ? ?Social History  ? ?Socioeconomic History  ?  Marital status: Single  ?  Spouse name: Not on file  ? Number of children: Not on file  ? Years of education: Not on file  ? Highest education level: Not on file  ?Occupational History  ? Not on file  ?Tobacco Use  ? Smoking status: Never  ? Smokeless tobacco: Never  ?Vaping Use  ? Vaping Use: Never used  ?Substance and Sexual Activity  ? Alcohol use: Not Currently  ? Drug use: Not Currently  ? Sexual activity: Yes  ?Other Topics Concern  ? Not on file  ?Social History Narrative  ? Not on file  ? ?Social Determinants of Health  ? ?Financial Resource Strain: Not on file  ?Food Insecurity: Not on file  ?Transportation Needs: Not on file  ?Physical Activity: Not on file  ?Stress: Not on file  ?Social Connections: Not on file  ?Intimate Partner Violence: Not on file  ? ?Family History  ?Problem Relation Age of Onset  ? Hypertension Mother   ? Heart failure Father   ? Hypertension Sister   ? Hypertension Sister   ? Hypertension Sister   ? ?BP 122/84   Pulse 93   Wt 93.9 kg (207 lb)   SpO2 98%   BMI 35.53 kg/m?  ? ?Wt Readings from Last 3 Encounters:  ?02/17/22 93.9 kg (207 lb)  ?12/18/21 95.5 kg (210 lb 9.6 oz)  ?09/17/21 96 kg (211 lb 9.6 oz)  ? ?PHYSICAL EXAM: ?General:  NAD. No resp difficulty ?HEENT: Normal ?Neck: Supple. No JVD. Carotids 2+ bilat; no bruits. No lymphadenopathy. Mild thryomegaly appreciated. ?Cor: PMI nondisplaced. Regular rate & rhythm. No rubs, gallops or murmurs. ?Lungs: Clear ?Abdomen: Obese, nontender, nondistended. No hepatosplenomegaly. No bruits or masses. Good bowel sounds. ?Extremities: No cyanosis, clubbing, rash, edema ?Neuro: Alert & oriented x 3, cranial nerves grossly intact. Moves all 4 extremities w/o difficulty. Affect pleasant. ? ?ASSESSMENT & PLAN: ?1. 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 Echo in 10/20 with EF 20% and diffuse hypokinesis.  RFs include African-American, gestational diabetes, and pre-eclampsia. She was HIV negative.  Father has CHF, she  is not certain about details.  She does not drink ETOH or use drugs. No chest pain, CAD is unlikely.  TSH has been normal.  Echo in 1/22 showed EF up to 35-40% with normal RV.  Cardiac MRI in 6/22 showed EF 39%, no LGE. Echo in 1/23 with EF 40-45%.  She is not volume overloaded on exam. NYHA class I-II. ?- EF is out of ICD range.  ?- Increase Bidil to 1 tab tid. ?- Continue Entresto 97/103 bid. BMET today.  ?- Continue Farxiga 10 mg daily.  ?- Continue spironolactone 50 mg daily.   ?- Continue Coreg 25 mg bid.   ?- She understands risk of fetal toxicity w/ HF meds and also the risk of worsening HF if she were to get pregnant again.  She is using contraception.  ?2. Palpitations/Presyncope: 11/2020 Zio Patch showed no significant arrhythmias.  ?3. Thyromegaly: Noted on exam.  ?- TSH ok  1/23. ? ?Followup in 3-4 months with Dr. Aundra Dubin ? ?Robin Bihari, FNP ?02/17/22 ?

## 2022-02-17 ENCOUNTER — Other Ambulatory Visit (HOSPITAL_COMMUNITY): Payer: Self-pay | Admitting: Cardiology

## 2022-02-17 ENCOUNTER — Ambulatory Visit (HOSPITAL_COMMUNITY)
Admission: RE | Admit: 2022-02-17 | Discharge: 2022-02-17 | Disposition: A | Discharge status: Home / Self Care | Payer: No Typology Code available for payment source | Source: Ambulatory Visit | Attending: Family Medicine | Admitting: Family Medicine

## 2022-02-17 ENCOUNTER — Other Ambulatory Visit: Payer: Self-pay

## 2022-02-17 ENCOUNTER — Other Ambulatory Visit (HOSPITAL_COMMUNITY): Payer: Self-pay | Admitting: Family Medicine

## 2022-02-17 ENCOUNTER — Encounter (HOSPITAL_COMMUNITY): Payer: Self-pay

## 2022-02-17 VITALS — BP 122/84 | HR 93 | Wt 207.0 lb

## 2022-02-17 DIAGNOSIS — Z79899 Other long term (current) drug therapy: Secondary | ICD-10-CM | POA: Insufficient documentation

## 2022-02-17 DIAGNOSIS — Z8249 Family history of ischemic heart disease and other diseases of the circulatory system: Secondary | ICD-10-CM | POA: Insufficient documentation

## 2022-02-17 DIAGNOSIS — E010 Iodine-deficiency related diffuse (endemic) goiter: Secondary | ICD-10-CM | POA: Insufficient documentation

## 2022-02-17 DIAGNOSIS — I5022 Chronic systolic (congestive) heart failure: Secondary | ICD-10-CM | POA: Insufficient documentation

## 2022-02-17 DIAGNOSIS — R002 Palpitations: Secondary | ICD-10-CM

## 2022-02-17 LAB — BASIC METABOLIC PANEL
Anion gap: 6 (ref 5–15)
BUN: 11 mg/dL (ref 6–20)
CO2: 24 mmol/L (ref 22–32)
Calcium: 9 mg/dL (ref 8.9–10.3)
Chloride: 107 mmol/L (ref 98–111)
Creatinine, Ser: 0.79 mg/dL (ref 0.44–1.00)
GFR, Estimated: >60 mL/min (ref >60)
Glucose, Bld: 107 mg/dL — ABNORMAL HIGH (ref 70–99)
Potassium: 3.8 mmol/L (ref 3.5–5.1)
Sodium: 137 mmol/L (ref 135–145)

## 2022-02-17 MED ORDER — ISOSORB DINITRATE-HYDRALAZINE 20-37.5 MG PO TABS: 1.0000 | ORAL_TABLET | Freq: Three times a day (TID) | ORAL | 4 refills | Status: DC

## 2022-02-17 NOTE — Patient Instructions (Addendum)
Thank you for coming in today ? ?EKG was done today ? ?Labs were done today, if any labs are abnormal the clinic will call you ?No new is good news ? ?INCREASE Bidil to 20/37.5 mg 1 tablet three times daily  ? ?Your physician recommends that you schedule a follow-up appointment in:  ?3-4 months with Dr.Mclean ? ?At the [facility], you and your health needs are our priority. As part of our continuing mission to provide you with exceptional heart care, we have created designated Provider Care Teams. These Care Teams include your primary Cardiologist (physician) and Advanced Practice Providers (APPs- Physician Assistants and Nurse Practitioners) who all work together to provide you with the care you need, when you need it.  ? ?You may see any of the following providers on your designated Care Team at your next follow up: ?Dr Arvilla Meres ?Dr Marca Ancona ?Tonye Becket, NP ?Robbie Lis, PA ?Jessica Milford,NP ?Anna Genre, PA ?Karle Plumber, PharmD ? ? ?Please be sure to bring in all your medications bottles to every appointment.  ? ?If you have any questions or concerns before your next appointment please send [place] a message through [place] or call our office at [phone].   ? ?TO LEAVE A MESSAGE FOR THE NURSE SELECT OPTION 2, PLEASE LEAVE A MESSAGE INCLUDING: ?YOUR NAME ?DATE OF BIRTH ?CALL BACK NUMBER ?REASON FOR CALL**this is important as we prioritize the call backs ? ?YOU WILL RECEIVE A CALL BACK THE SAME DAY AS LONG AS YOU CALL BEFORE 4:00 PM ? ?

## 2022-02-26 ENCOUNTER — Other Ambulatory Visit: Payer: Self-pay

## 2022-02-26 ENCOUNTER — Encounter (HOSPITAL_COMMUNITY): Payer: Self-pay

## 2022-02-26 ENCOUNTER — Emergency Department (HOSPITAL_COMMUNITY): Payer: No Typology Code available for payment source

## 2022-02-26 ENCOUNTER — Emergency Department (HOSPITAL_COMMUNITY)
Admission: EM | Admit: 2022-02-26 | Discharge: 2022-02-26 | Disposition: A | Discharge status: Home / Self Care | Payer: No Typology Code available for payment source | Attending: Emergency Medicine | Admitting: Emergency Medicine

## 2022-02-26 DIAGNOSIS — T465X5A Adverse effect of other antihypertensive drugs, initial encounter: Secondary | ICD-10-CM | POA: Insufficient documentation

## 2022-02-26 DIAGNOSIS — R710 Precipitous drop in hematocrit: Secondary | ICD-10-CM | POA: Insufficient documentation

## 2022-02-26 DIAGNOSIS — R519 Headache, unspecified: Secondary | ICD-10-CM | POA: Diagnosis present

## 2022-02-26 DIAGNOSIS — R42 Dizziness and giddiness: Secondary | ICD-10-CM | POA: Insufficient documentation

## 2022-02-26 DIAGNOSIS — I509 Heart failure, unspecified: Secondary | ICD-10-CM | POA: Insufficient documentation

## 2022-02-26 DIAGNOSIS — R0602 Shortness of breath: Secondary | ICD-10-CM | POA: Diagnosis not present

## 2022-02-26 DIAGNOSIS — T50905A Adverse effect of unspecified drugs, medicaments and biological substances, initial encounter: Secondary | ICD-10-CM

## 2022-02-26 LAB — CBC WITH DIFFERENTIAL/PLATELET
Abs Immature Granulocytes: 0.03 10*3/uL (ref 0.00–0.07)
Basophils Absolute: 0 10*3/uL (ref 0.0–0.1)
Basophils Relative: 1 %
Eosinophils Absolute: 0.1 10*3/uL (ref 0.0–0.5)
Eosinophils Relative: 1 %
HCT: 34.3 % — ABNORMAL LOW (ref 36.0–46.0)
Hemoglobin: 10.8 g/dL — ABNORMAL LOW (ref 12.0–15.0)
Immature Granulocytes: 0 %
Lymphocytes Relative: 26 %
Lymphs Abs: 2 10*3/uL (ref 0.7–4.0)
MCH: 25.5 pg — ABNORMAL LOW (ref 26.0–34.0)
MCHC: 31.5 g/dL (ref 30.0–36.0)
MCV: 81.1 fL (ref 80.0–100.0)
Monocytes Absolute: 0.3 10*3/uL (ref 0.1–1.0)
Monocytes Relative: 4 %
Neutro Abs: 5.1 10*3/uL (ref 1.7–7.7)
Neutrophils Relative %: 68 %
Platelets: 304 10*3/uL (ref 150–400)
RBC: 4.23 MIL/uL (ref 3.87–5.11)
RDW: 14.7 % (ref 11.5–15.5)
WBC: 7.5 10*3/uL (ref 4.0–10.5)
nRBC: 0 % (ref 0.0–0.2)

## 2022-02-26 LAB — COMPREHENSIVE METABOLIC PANEL
ALT: 14 U/L (ref 0–44)
AST: 17 U/L (ref 15–41)
Albumin: 3.2 g/dL — ABNORMAL LOW (ref 3.5–5.0)
Alkaline Phosphatase: 33 U/L — ABNORMAL LOW (ref 38–126)
Anion gap: 8 (ref 5–15)
BUN: 11 mg/dL (ref 6–20)
CO2: 24 mmol/L (ref 22–32)
Calcium: 8.8 mg/dL — ABNORMAL LOW (ref 8.9–10.3)
Chloride: 105 mmol/L (ref 98–111)
Creatinine, Ser: 0.92 mg/dL (ref 0.44–1.00)
GFR, Estimated: >60 mL/min (ref >60)
Glucose, Bld: 120 mg/dL — ABNORMAL HIGH (ref 70–99)
Potassium: 4.2 mmol/L (ref 3.5–5.1)
Sodium: 137 mmol/L (ref 135–145)
Total Bilirubin: 0.4 mg/dL (ref 0.3–1.2)
Total Protein: 6.2 g/dL — ABNORMAL LOW (ref 6.5–8.1)

## 2022-02-26 LAB — BRAIN NATRIURETIC PEPTIDE: B Natriuretic Peptide: 27.8 pg/mL (ref 0.0–100.0)

## 2022-02-26 MED ORDER — METOCLOPRAMIDE HCL 5 MG/ML IJ SOLN
10.0000 mg | Freq: Once | INTRAMUSCULAR | Status: DC
  Filled 2022-02-26: qty 2

## 2022-02-26 MED ORDER — MAGNESIUM SULFATE 2 GM/50ML IV SOLN
2.0000 g | Freq: Once | INTRAVENOUS | Status: AC
  Administered 2022-02-26: 2 g via INTRAVENOUS
  Filled 2022-02-26: qty 50

## 2022-02-26 MED ORDER — ACETAMINOPHEN 500 MG PO TABS
1000.0000 mg | ORAL_TABLET | Freq: Once | ORAL | Status: AC
  Administered 2022-02-26: 1000 mg via ORAL
  Filled 2022-02-26: qty 2

## 2022-02-26 NOTE — Discharge Instructions (Signed)
The headaches you are experiencing today are most likely a result of the medication you recently started.  If after discontinuing this medication you continue to have headaches you should have an MRI done to further evaluate your brain.  Your labs today are normal without evidence of your congestive heart failure being worse. ?

## 2022-02-26 NOTE — ED Provider Notes (Signed)
?[name] EMERGENCY DEPARTMENT ?Provider Note ? ? ?CSN: [id number] ?Arrival date & time: 02/26/22  0701 ? ?  ? ?History ? ?Chief Complaint  ?Patient presents with  ? Headache  ? Shortness of Breath  ? ? ?Robin Gallegos is a 31 y.o. female. ? 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  She reports that it started off as a migraine on the right side of her head behind her eye and into her head.  Then on Monday it moved to the left side and then yesterday it was throughout her entire head and down in her neck.  The headache would get a little bit better with BC powder or Tylenol but then it would come back.  When she woke up this morning she was still having a headache and today's headache is behind both eyes but only on the right side of her neck and back of her head.  She decided to go to work but reports when she got to work the headache became much worse and she started feeling dizzy which she describes as off-balance when she was standing she felt like she might fall down.  She then started noticing that she was short of breath with any exertion and she was urinating a lot.  She did take a Lasix this morning but reports that she has been following her weight regularly and her weight has been stable.  She is compliant with all of her medications.  She denies any orthopnea over the last few days and has been sleeping well.  Her shortness of breath and dizziness all improved with lying down.  She has not noticed any swelling in her legs.  She denies any chest pain.  No cough, congestion or fever.  No blurry vision, unilateral numbness or weakness.  She denies significant history for recurrent headaches. ? ?The history is provided  by the patient and medical records.  ?Headache ?Shortness of Breath ?Associated symptoms: headaches   ? ?  ? ?Home Medications ?Prior to Admission medications   ?Medication Sig Start Date End Date Taking? Authorizing Provider  ?carvedilol (COREG) 25 MG tablet Take 1 tablet (25 mg total) by mouth 2 (two) times daily with a meal. 04/16/21   Laurey Morale, MD  ?dapagliflozin propanediol (FARXIGA) 10 MG TABS tablet Take 1 tablet (10 mg total) by mouth daily before breakfast. 09/17/21   Milford, Anderson Malta, FNP  ?furosemide (LASIX) 20 MG tablet Take 1 tablet (20 mg total) by mouth as needed (3lb weight gain overnight or 5lb weightgain in a week). 09/17/21 09/17/22  Jacklynn Ganong, FNP  ?isosorbide-hydrALAZINE (BIDIL) 20-37.5 MG tablet Take 1 tablet by mouth 3 (three) times daily. Take 1/2 tablet by mouth 3 times daily. 02/17/22   Jacklynn Ganong, FNP  ?sacubitril-valsartan (ENTRESTO) 97-103 MG Take 1 tablet by mouth 2 (two) times daily. 01/14/21   Laurey Morale, MD  ?spironolactone (ALDACTONE) 25 MG tablet Take 2 tablets (50 mg total) by mouth daily. 08/20/21   Jacklynn Ganong, FNP  ?   ? ?Allergies    ?Penicillins and Cleocin [clindamycin]   ? ?Review of Systems   ?Review of Systems  ?Respiratory:  Positive for shortness of breath.   ?  Neurological:  Positive for headaches.  ? ?Physical Exam ?Updated Vital Signs ?BP 105/77   Pulse 79   Temp 98 ?F (36.7 ?C)   Resp 18   Ht 5\' 4"  (1.626 m)   Wt 95.3 kg   LMP 02/19/2022 (Approximate)   SpO2 100%   BMI 36.05 kg/m?  ?Physical Exam ?Vitals and nursing note reviewed.  ?Constitutional:   ?   General: She is not in acute distress. ?   Appearance: She is well-developed.  ?HENT:  ?   Head: Normocephalic and atraumatic.  ?Eyes:  ?   Extraocular Movements: Extraocular movements intact.  ?   Conjunctiva/sclera: Conjunctivae normal.  ?   Pupils: Pupils are equal, round, and reactive to light.  ?Neck:  ?   Comments: No meningeal signs ?Cardiovascular:  ?   Rate and Rhythm:  Normal rate and regular rhythm.  ?   Heart sounds: Normal heart sounds. No murmur heard. ?  No friction rub.  ?Pulmonary:  ?   Effort: Pulmonary effort is normal.  ?   Breath sounds: Normal breath sounds. No wheezing or rales.  ?Abdominal:  ?   General: Bowel sounds are normal. There is no distension.  ?   Palpations: Abdomen is soft.  ?   Tenderness: There is no abdominal tenderness. There is no guarding or rebound.  ?Musculoskeletal:     ?   General: No tenderness. Normal range of motion.  ?   Cervical back: Normal range of motion and neck supple. No tenderness.  ?   Right lower leg: No edema.  ?   Left lower leg: No edema.  ?   Comments: No edema  ?Skin: ?   General: Skin is warm and dry.  ?   Findings: No rash.  ?Neurological:  ?   Mental Status: She is alert and oriented to person, place, and time. Mental status is at baseline.  ?   Cranial Nerves: No cranial nerve deficit.  ?   Sensory: No sensory deficit.  ?   Motor: No weakness.  ?   Gait: Gait normal.  ?Psychiatric:     ?   Mood and Affect: Mood normal.     ?   Behavior: Behavior normal.  ? ? ?ED Results / Procedures / Treatments   ?Labs ?(all labs ordered are listed, but only abnormal results are displayed) ?Labs Reviewed  ?CBC WITH DIFFERENTIAL/PLATELET - Abnormal; Notable for the following components:  ?    Result Value  ? Hemoglobin 10.8 (*)   ? HCT 34.3 (*)   ? MCH 25.5 (*)   ? All other components within normal limits  ?COMPREHENSIVE METABOLIC PANEL - Abnormal; Notable for the following components:  ? Glucose, Bld 120 (*)   ? Calcium 8.8 (*)   ? Total Protein 6.2 (*)   ? Albumin 3.2 (*)   ? Alkaline Phosphatase 33 (*)   ? All other components within normal limits  ?BRAIN NATRIURETIC PEPTIDE  ? ? ?EKG ?EKG Interpretation ? ?Date/Time:  Wednesday February 26 2022 07:18:00 EDT ?Ventricular Rate:  83 ?PR Interval:  143 ?QRS Duration: 112 ?QT Interval:  422 ?QTC Calculation: 496 ?R Axis:   21 ?Text Interpretation: Sinus rhythm Borderline intraventricular  conduction delay Abnormal T, consider ischemia, diffuse leads new  Prolonged QT interval Confirmed by [id number] (Gwyneth Sprout) on 02/26/2022 7:22:40 AM ? ?Radiology ?CT Head Wo Contrast ? ?Result Date: 02/26/2022 ?CLINICAL DATA:  Headache, sudden, severe EXAM: CT HEAD WITHOUT CONTRAST TECHNIQUE: Contiguous axial images were  obtained from the base of the skull through the vertex without intravenous contrast. RADIATION DOSE REDUCTION: This exam was performed according to the departmental dose-optimization program which includes automated exposure control, adjustment of the mA and/or kV according to patient size and/or use of iterative reconstruction technique. COMPARISON:  None. FINDINGS: Brain: No evidence of acute infarction, hemorrhage, hydrocephalus, extra-axial collection or mass lesion/mass effect. Vascular: No hyperdense vessel identified. Skull: No acute fracture. Sinuses/Orbits: Clear sinuses.  No acute orbital findings. Other: No mastoid effusions IMPRESSION: 1. 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 An MRI could better evaluate if clinically warranted. 2. Otherwise, no evidence of acute intracranial abnormality. Electronically Signed   By: Feliberto Harts M.D.   On: 02/26/2022 10:29  ? ?DG Chest Port 1 View ? ?Result Date: 02/26/2022 ?CLINICAL DATA:  Shortness of breath, headache, CHF EXAM: PORTABLE CHEST 1 VIEW COMPARISON:  Portable exam 0756 hours compared to 10/04/2019 FINDINGS: Normal heart size, mediastinal contours, and pulmonary vascularity. Lungs clear. No pulmonary infiltrate, pleural effusion, or pneumothorax. Pronounced biconvex thoracic scoliosis. IMPRESSION: No acute abnormalities. Electronically Signed   By: Ulyses Southward M.D.   On: 02/26/2022 08:07   ? ?Procedures ?Procedures  ? ? ?Medications Ordered in ED ?Medications  ?metoCLOPramide (REGLAN) injection 10 mg (0 mg Intravenous  Hold 02/26/22 0810)  ?acetaminophen (TYLENOL) tablet 1,000 mg (1,000 mg Oral Given 02/26/22 0809)  ?magnesium sulfate IVPB 2 g 50 mL (2 g Intravenous New Bag/Given 02/26/22 1030)  ? ? ?ED Course/ Medical Decision [facility]

## 2022-02-26 NOTE — ED Triage Notes (Signed)
Pt reports HA onset Sunday, attempted to take medication for HA without relief. Reports symptoms gradually worsened. Onset dizziness, SHOB, and urinary frequency this am. Reports she was dx with HF in October 2020 and symptoms are similar when she was originally diagnosed.  ?

## 2022-02-26 NOTE — ED Notes (Signed)
Patient transported to CT 

## 2022-02-27 ENCOUNTER — Telehealth: Payer: Self-pay

## 2022-02-27 NOTE — Telephone Encounter (Signed)
Transition Care Management Unsuccessful Follow-up Telephone Call ? ?Date of discharge and from where:  02/26/2022-[place]  ? ?Attempts:  1st Attempt ? ?Reason for unsuccessful TCM follow-up call:  Left voice message ? ?  ?

## 2022-02-28 NOTE — Telephone Encounter (Signed)
Transition Care Management Unsuccessful Follow-up Telephone Call ? ?Date of discharge and from where:  02/26/2022-[place] ? ?Attempts:  2nd Attempt ? ?Reason for unsuccessful TCM follow-up call:  Left voice message ? ?  ?

## 2022-03-04 NOTE — Telephone Encounter (Signed)
Transition Care Management Unsuccessful Follow-up Telephone Call ? ?Date of discharge and from where:  02/26/2022-[place]  ? ?Attempts:  3rd Attempt ? ?Reason for unsuccessful TCM follow-up call:  Left voice message ? ?  ?

## 2022-03-05 ENCOUNTER — Other Ambulatory Visit
Admission: RE | Admit: 2022-03-05 | Discharge: 2022-03-05 | Disposition: A | Discharge status: Home / Self Care | Payer: MEDICAID | Source: Ambulatory Visit | Attending: Medical | Admitting: Medical

## 2022-03-05 ENCOUNTER — Other Ambulatory Visit: Payer: Self-pay

## 2022-03-05 DIAGNOSIS — Z7251 High risk heterosexual behavior: Secondary | ICD-10-CM | POA: Insufficient documentation

## 2022-03-05 DIAGNOSIS — Z118 Encounter for screening for other infectious and parasitic diseases: Secondary | ICD-10-CM | POA: Insufficient documentation

## 2022-03-05 DIAGNOSIS — M545 Low back pain, unspecified: Secondary | ICD-10-CM | POA: Insufficient documentation

## 2022-03-05 DIAGNOSIS — Z113 Encounter for screening for infections with a predominantly sexual mode of transmission: Secondary | ICD-10-CM | POA: Insufficient documentation

## 2022-03-05 MED ORDER — FLUCONAZOLE 150 MG PO TABS *I*
150.0000 mg | ORAL_TABLET | ORAL | 0 refills | Status: DC
Start: 2022-03-05 — End: 2022-04-28
  Filled 2022-03-05: qty 2, 6d supply, fill #0

## 2022-03-05 MED ORDER — METRONIDAZOLE 0.75 % VA GEL *I*
1.0000 | Freq: Every evening | VAGINAL | 0 refills | Status: DC
Start: 2022-03-05 — End: 2023-12-23
  Filled 2022-03-05: qty 70, 5d supply, fill #0

## 2022-03-07 LAB — N. GONORRHOEAE NAAT (PCR): N. gonorrhoeae DNA Amplification: 0

## 2022-03-07 LAB — TRICHOMONAS NAAT (PCR): Trichomonas DNA amplification: 0

## 2022-03-07 LAB — CHLAMYDIA NAAT (PCR): Chlamydia Plasmid DNA Amplification: 0

## 2022-03-07 LAB — AEROBIC CULTURE: Aerobic Culture: 0

## 2022-04-16 ENCOUNTER — Ambulatory Visit (HOSPITAL_COMMUNITY)
Admission: RE | Admit: 2022-04-16 | Discharge: 2022-04-16 | Disposition: A | Discharge status: Home / Self Care | Payer: No Typology Code available for payment source | Source: Ambulatory Visit | Attending: Family Medicine | Admitting: Family Medicine

## 2022-04-16 ENCOUNTER — Ambulatory Visit: Payer: No Typology Code available for payment source

## 2022-04-16 VITALS — BP 141/98 | HR 122 | Temp 101.2°F | Resp 16

## 2022-04-16 DIAGNOSIS — R509 Fever, unspecified: Secondary | ICD-10-CM | POA: Diagnosis present

## 2022-04-16 DIAGNOSIS — J029 Acute pharyngitis, unspecified: Secondary | ICD-10-CM | POA: Diagnosis present

## 2022-04-16 LAB — POCT RAPID STREP A, ED / UC: Streptococcus, Group A Screen (Direct): NEGATIVE

## 2022-04-16 MED ORDER — AZITHROMYCIN 250 MG PO TABS: 250.0000 mg | ORAL_TABLET | Freq: Every day | ORAL | 0 refills | Status: DC

## 2022-04-16 MED ORDER — ACETAMINOPHEN 325 MG PO TABS
650.0000 mg | ORAL_TABLET | Freq: Once | ORAL | Status: AC
  Administered 2022-04-16: 650 mg via ORAL

## 2022-04-16 MED ORDER — ACETAMINOPHEN 325 MG PO TABS
ORAL_TABLET | ORAL | Status: AC
  Filled 2022-04-16: qty 2

## 2022-04-16 NOTE — ED Triage Notes (Signed)
Patient reports sore throat pain for 2 days.  Patient reports a slight cough.  Patient reports chills and body aches.  Patient feels like throat is swollen.  Denies breathing issues. Patient reports swallowing is very painful.   ? ?Child recently had a cold ?

## 2022-04-16 NOTE — Discharge Instructions (Signed)
You may use over the counter ibuprofen or acetaminophen as needed.  °For a sore throat, over the counter products such as [employer] Peroxyl Mouth Sore Rinse or Chloraseptic Sore Throat Spray may provide some temporary relief. ° ° ° ° °

## 2022-04-16 NOTE — ED Provider Notes (Signed)
?[facility] CARE CENTER ? ? ?[id number] ?04/16/22 Arrival Time: [phone] ? ?ASSESSMENT & PLAN: ? ?1. Sore throat   ?2. Fever, unspecified fever cause   ? ?No signs of peritonsillar abscess. Discussed. ?Rapid strep negative. Culture sent. ? ?Given fever and throat exam, will tx empirically for strep. ?Begin: ?Meds ordered this encounter  ?Medications  ? acetaminophen (TYLENOL) tablet 650 mg  ? azithromycin (ZITHROMAX) 250 MG tablet  ?  Sig: Take 1 tablet (250 mg total) by mouth daily. Take first 2 tablets together, then 1 every day until finished.  ?  Dispense:  6 tablet  ?  Refill:  0  ? ? ?Results for orders placed or performed during the hospital encounter of 04/16/22  ?POCT Rapid Strep A  ?Result Value Ref Range  ? Streptococcus, Group A Screen (Direct) NEGATIVE NEGATIVE  ? ?Labs Reviewed  ?CULTURE, GROUP A STREP [name])  ?POCT RAPID STREP A, ED / UC  ? ? ?OTC analgesics and throat care as needed. ?Work note provided. ? ?Instructed to finish course of antibiotics. Will follow up if not showing significant improvement over the next 24-48 hours. ? ? ? ?Discharge Instructions   ? ?  ?You may use over the counter ibuprofen or acetaminophen as needed.  ?For a sore throat, over the counter products such as [employer] Peroxyl Mouth Sore Rinse or Chloraseptic Sore Throat Spray may provide some temporary relief. ? ? ? ? ? ? ?Reviewed expectations re: course of current medical issues. Questions answered. ?Outlined signs and symptoms indicating need for more acute intervention. ?Patient verbalized understanding. ?After Visit Summary given. ? ? ?SUBJECTIVE: ? ?Robin Gallegos is a 31 y.o. female who reports a sore throat. Onset abrupt beginning  2 d ago . Symptoms have gradually worsened since beginning; without voice changes. No respiratory symptoms. Normal PO intake but reports discomfort with swallowing. No specific alleviating factors. Fever: believed to be present, temp not taken. No neck pain or swelling. No associated nausea,  vomiting, or abdominal pain. Known sick contacts: child with URI. Recent travel: none. ?No tx PTA. ? ?OBJECTIVE: ? ?Vitals:  ? 04/16/22 1005  ?BP: (!) 141/98  ?Pulse: (!) 122  ?Resp: 16  ?Temp: (!) 101.2 ?F (38.4 ?C)  ?TempSrc: Oral  ?SpO2: 100%  ?  ?Fever and tachycardia noted. ? ?General appearance: alert; no distress ?HEENT: throat with significant erythema and with exudative tonsillar hypertrophy; uvula is midline ?Neck: supple with FROM; small bilateral LAD ?Lungs: speaks full sentences without difficulty; unlabored ?Abd: soft; non-tender ?Skin: reveals no rash; warm and dry ?Psychological: alert and cooperative; normal mood and affect ? ?Allergies  ?Allergen Reactions  ? Penicillins Hives  ?  Did it involve swelling of the face/tongue/throat, SOB, or low BP? Unknown ?Did it involve sudden or severe rash/hives, skin peeling, or any reaction on the inside of your mouth or nose? Unknown ?Did you need to seek medical attention at a hospital or doctor's office? Unknown ?When did it last happen?     pt was a child  ?If all above answers are ?NO?, may proceed with cephalosporin use. ?  ? Cleocin [Clindamycin] Rash  ? ? ?Past Medical History:  ?Diagnosis Date  ? CHF (congestive heart failure) (HCC)   ? Pre-eclampsia   ? ?Social History  ? ?Socioeconomic History  ? Marital status: Single  ?  Spouse name: Not on file  ? Number of children: Not on file  ? Years of education: Not on file  ? Highest education level: Not on file  ?  Occupational History  ? Not on file  ?Tobacco Use  ? Smoking status: Never  ? Smokeless tobacco: Never  ?Vaping Use  ? Vaping Use: Never used  ?Substance and Sexual Activity  ? Alcohol use: Not Currently  ? Drug use: Not Currently  ? Sexual activity: Yes  ?Other Topics Concern  ? Not on file  ?Social History Narrative  ? Not on file  ? ?Social Determinants of Health  ? ?Financial Resource Strain: Not on file  ?Food Insecurity: Not on file  ?Transportation Needs: Not on file  ?Physical Activity:  Not on file  ?Stress: Not on file  ?Social Connections: Not on file  ?Intimate Partner Violence: Not on file  ? ?Family History  ?Problem Relation Age of Onset  ? Hypertension Mother   ? Heart failure Father   ? Hypertension Sister   ? Hypertension Sister   ? Hypertension Sister   ? ? ? ? ? ? ? ?  ?Mardella Layman, MD ?04/16/22 1053 ? ?

## 2022-04-16 NOTE — ED Triage Notes (Signed)
C/o sore throat x 2 days.

## 2022-04-17 LAB — CULTURE, GROUP A STREP (THRC)

## 2022-04-28 ENCOUNTER — Other Ambulatory Visit: Payer: Self-pay

## 2022-04-28 ENCOUNTER — Other Ambulatory Visit
Admission: RE | Admit: 2022-04-28 | Discharge: 2022-04-28 | Disposition: A | Discharge status: Home / Self Care | Payer: MEDICAID | Source: Ambulatory Visit | Attending: Medical | Admitting: Medical

## 2022-04-28 ENCOUNTER — Other Ambulatory Visit
Admission: RE | Admit: 2022-04-28 | Discharge: 2022-04-28 | Disposition: A | Discharge status: Home / Self Care | Payer: MEDICAID | Source: Ambulatory Visit

## 2022-04-28 DIAGNOSIS — Z118 Encounter for screening for other infectious and parasitic diseases: Secondary | ICD-10-CM | POA: Insufficient documentation

## 2022-04-28 DIAGNOSIS — Z113 Encounter for screening for infections with a predominantly sexual mode of transmission: Secondary | ICD-10-CM | POA: Insufficient documentation

## 2022-04-28 DIAGNOSIS — Z7251 High risk heterosexual behavior: Secondary | ICD-10-CM | POA: Insufficient documentation

## 2022-04-28 LAB — HEPATITIS B & C PROF
HBV Core Ab: NEGATIVE
HBV S Ab Quant: 1.08 m[IU]/mL
HBV S Ab: NEGATIVE
HBV S Ag: NEGATIVE
Hep C Ab: NEGATIVE

## 2022-04-28 LAB — SYPHILIS SCREEN
Syphilis Screen: NEGATIVE
Syphilis Status: NONREACTIVE

## 2022-04-28 LAB — HIV-1/2  ANTIGEN/ANTIBODY SCREEN WITH CONFIRMATION: HIV 1&2 ANTIGEN/ANTIBODY: NONREACTIVE

## 2022-04-28 MED ORDER — FLUCONAZOLE 150 MG PO TABS *I*
150.0000 mg | ORAL_TABLET | ORAL | 0 refills | Status: DC
Start: 2022-04-28 — End: 2022-05-08
  Filled 2022-04-28: qty 3, 9d supply, fill #0

## 2022-04-29 LAB — TRICHOMONAS NAAT (PCR): Trichomonas NAAT (PCR): NEGATIVE

## 2022-04-29 LAB — CHLAMYDIA NAAT (PCR): Chlamydia NAAT (PCR): NEGATIVE

## 2022-04-29 LAB — N. GONORRHOEAE NAAT (PCR): N. gonorrhoeae NAAT (PCR): NEGATIVE

## 2022-04-30 ENCOUNTER — Other Ambulatory Visit: Payer: Self-pay

## 2022-05-06 ENCOUNTER — Other Ambulatory Visit: Payer: Self-pay

## 2022-05-06 ENCOUNTER — Other Ambulatory Visit
Admission: RE | Admit: 2022-05-06 | Discharge: 2022-05-06 | Disposition: A | Discharge status: Home / Self Care | Payer: MEDICAID | Source: Ambulatory Visit | Attending: Medical | Admitting: Medical

## 2022-05-06 DIAGNOSIS — Z118 Encounter for screening for other infectious and parasitic diseases: Secondary | ICD-10-CM | POA: Insufficient documentation

## 2022-05-06 DIAGNOSIS — Z7251 High risk heterosexual behavior: Secondary | ICD-10-CM | POA: Insufficient documentation

## 2022-05-06 DIAGNOSIS — Z113 Encounter for screening for infections with a predominantly sexual mode of transmission: Secondary | ICD-10-CM | POA: Insufficient documentation

## 2022-05-06 MED ORDER — METRONIDAZOLE 500 MG PO TABS *I*
500.0000 mg | ORAL_TABLET | Freq: Two times a day (BID) | ORAL | 0 refills | Status: AC
Start: 2022-05-06 — End: 2022-05-13
  Filled 2022-05-06: qty 14, 7d supply, fill #0

## 2022-05-07 LAB — N. GONORRHOEAE NAAT (PCR): N. gonorrhoeae NAAT (PCR): NEGATIVE

## 2022-05-07 LAB — CHLAMYDIA NAAT (PCR): Chlamydia NAAT (PCR): NEGATIVE

## 2022-05-07 LAB — TRICHOMONAS NAAT (PCR): Trichomonas NAAT (PCR): NEGATIVE

## 2022-05-08 ENCOUNTER — Other Ambulatory Visit: Payer: Self-pay

## 2022-05-08 MED ORDER — FLUCONAZOLE 150 MG PO TABS *I*
150.0000 mg | ORAL_TABLET | ORAL | 0 refills | Status: DC
Start: 2022-05-06 — End: 2022-09-18
  Filled 2022-05-08: qty 2, 6d supply, fill #0

## 2022-05-09 ENCOUNTER — Other Ambulatory Visit (HOSPITAL_COMMUNITY): Payer: Self-pay

## 2022-05-09 MED ORDER — ISOSORB DINITRATE-HYDRALAZINE 20-37.5 MG PO TABS: 1.0000 | ORAL_TABLET | Freq: Three times a day (TID) | ORAL | 4 refills | Status: DC

## 2022-05-14 ENCOUNTER — Other Ambulatory Visit (HOSPITAL_COMMUNITY): Payer: Self-pay | Admitting: Family Medicine

## 2022-05-14 ENCOUNTER — Other Ambulatory Visit (HOSPITAL_COMMUNITY): Payer: Self-pay | Admitting: Cardiology

## 2022-05-14 DIAGNOSIS — I5022 Chronic systolic (congestive) heart failure: Secondary | ICD-10-CM

## 2022-05-21 ENCOUNTER — Other Ambulatory Visit: Payer: Self-pay

## 2022-06-16 ENCOUNTER — Encounter (HOSPITAL_COMMUNITY): Payer: Self-pay | Admitting: Cardiology

## 2022-06-16 ENCOUNTER — Ambulatory Visit (HOSPITAL_COMMUNITY)
Admission: RE | Admit: 2022-06-16 | Discharge: 2022-06-16 | Disposition: A | Discharge status: Home / Self Care | Payer: No Typology Code available for payment source | Source: Ambulatory Visit | Attending: Cardiology | Admitting: Cardiology

## 2022-06-16 ENCOUNTER — Encounter (HOSPITAL_COMMUNITY): Payer: No Typology Code available for payment source | Admitting: Cardiology

## 2022-06-16 VITALS — BP 140/90 | HR 84 | Wt 206.4 lb

## 2022-06-16 DIAGNOSIS — Z8759 Personal history of other complications of pregnancy, childbirth and the puerperium: Secondary | ICD-10-CM | POA: Diagnosis not present

## 2022-06-16 DIAGNOSIS — I5022 Chronic systolic (congestive) heart failure: Secondary | ICD-10-CM | POA: Diagnosis not present

## 2022-06-16 DIAGNOSIS — O903 Peripartum cardiomyopathy: Secondary | ICD-10-CM

## 2022-06-16 DIAGNOSIS — Z8249 Family history of ischemic heart disease and other diseases of the circulatory system: Secondary | ICD-10-CM | POA: Diagnosis not present

## 2022-06-16 DIAGNOSIS — Z880 Allergy status to penicillin: Secondary | ICD-10-CM | POA: Insufficient documentation

## 2022-06-16 DIAGNOSIS — Z881 Allergy status to other antibiotic agents status: Secondary | ICD-10-CM | POA: Diagnosis not present

## 2022-06-16 DIAGNOSIS — Z79899 Other long term (current) drug therapy: Secondary | ICD-10-CM | POA: Diagnosis not present

## 2022-06-16 DIAGNOSIS — R55 Syncope and collapse: Secondary | ICD-10-CM | POA: Insufficient documentation

## 2022-06-16 LAB — BASIC METABOLIC PANEL
Anion gap: 4 — ABNORMAL LOW (ref 5–15)
BUN: 9 mg/dL (ref 6–20)
CO2: 26 mmol/L (ref 22–32)
Calcium: 9.4 mg/dL (ref 8.9–10.3)
Chloride: 108 mmol/L (ref 98–111)
Creatinine, Ser: 0.79 mg/dL (ref 0.44–1.00)
GFR, Estimated: >60 mL/min (ref >60)
Glucose, Bld: 101 mg/dL — ABNORMAL HIGH (ref 70–99)
Potassium: 4.4 mmol/L (ref 3.5–5.1)
Sodium: 138 mmol/L (ref 135–145)

## 2022-06-16 LAB — TSH: TSH: 1.321 u[IU]/mL (ref 0.350–4.500)

## 2022-06-16 MED ORDER — ISOSORB DINITRATE-HYDRALAZINE 20-37.5 MG PO TABS
2.0000 | ORAL_TABLET | Freq: Three times a day (TID) | ORAL | 5 refills | Status: DC
Start: 2022-06-16 — End: 2023-10-06

## 2022-06-16 NOTE — Progress Notes (Signed)
[facility] Note    PCP: Patient, No Pcp Per PCP-Cardiologist: Dr. Shirlee Latch  HPI:  Robin Gallegos is a 31 y.o. female G1P1. Pregnancy in 2020 was complicated by preeclampsia and gestational diabetes. She presented at [redacted] weeks pregnant w/ SOB and dizziness on 08/11/19 and was admitted for preeclampsia. Delivered a healthy girl. Post delivery course was uncomplicated. She was discharged home on nifedipine for further BP management. Nifedipine ultimately discontinued at post-partum f/u by OB.  She presented to the [name] ED in 10/20 w/ dyspnea and palpitations.  Echo in 10/20 showed reduced EF at 20%. HIV negative. No FH of cardiomyopathy or premature coronary disease.  No baseline coronary risk factors and no h/o chest pain. Slight hs-TnI elevation (18=>21) consistent with CHF. Not c/w SCAD and no clear RWMAs on echo. TSH normal. 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 She ws diuresed w/ IV Lasix w/ symptomatic improvement and improvement in volume. Diuresed to euvolemic state and placed on guideline-directed medical therapy. Started on daily PO lasix 40 mg daily. Pt counseled on the need for contraception while on cardiac meds, particularly Entresto. Discharge wt 191 lb.   Echo in 1/22 showed EF 35-40% with diffuse hypokinesis, normal RV size and systolic function, PASP 22, normal IVC.   Echo in 1/23 showed EF 40-45%, mild LV dilation, normal RV.   She returns for followup of CHF.  She is working as a Production designer, theatre/television/film at [employer]. She uses contraception and understands the risks of pregnancy.  Weight is down 1 lb.  No significant exertional dyspnea.  She has not had to use Lasix.  BP mildly elevated at times.  No orthopnea/PND.  No chest pain.   Labs (11/21): K 4.1, creatinine 0.78 Labs (1/22): BNP 43 Labs (2/22): K 4.1, creatinine 0.83 Labs (10/22): K 4.1, creatinine 0.81 Labs (3/23): BNP 28, K 4.2, creatinine 0.92  PMH: 1. 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   - Echo (10/20): EF 20%.  - Echo (4/21): EF 20-25%  - Echo (1/22): EF 35-40%, diffuse hypokinesis, normal RV, PASP 22, IVC normal.  - Cardiac MRI (6/22): EF 39%, RV EF 49%, no LGE.  - Echo (1/23): EF 40-45%, mild LV dilation, normal RV 2. Pre-eclampsia 3. Zio patch 12/21 with no significant arrhythmias.    Current Outpatient Medications  Medication Sig Dispense Refill   carvedilol (COREG) 25 MG tablet TAKE 1 TABLET (25 MG TOTAL) BY MOUTH 2 (TWO) TIMES DAILY WITH A MEAL. 180 tablet 3   dapagliflozin propanediol (FARXIGA) 10 MG TABS tablet Take 1 tablet (10 mg total) by mouth daily before breakfast. 30 tablet 11   furosemide (LASIX) 20 MG tablet Take 1 tablet (20 mg total) by mouth as needed (3lb weight gain overnight or 5lb weightgain in a week). 30 tablet 11   sacubitril-valsartan (ENTRESTO) 97-103 MG Take 1 tablet by mouth 2 (two) times daily. 180 tablet 3   spironolactone (ALDACTONE) 25 MG tablet TAKE 2 TABLETS BY MOUTH EVERY DAY 180 tablet 3   isosorbide-hydrALAZINE (BIDIL) 20-37.5 MG tablet Take 2 tablets by mouth 3 (three) times daily. 180 tablet 5   No current facility-administered medications for this encounter.    Allergies  Allergen Reactions   Penicillins Hives    Did it involve swelling of the face/tongue/throat, SOB, or low BP? Unknown Did it involve sudden or severe rash/hives, skin peeling, or any reaction on the inside of your mouth or nose? Unknown Did you need to seek  medical attention at a hospital or doctor's office? Unknown When did it last happen?     pt was a child  If all above answers are "NO", may proceed with cephalosporin use.    Cleocin [Clindamycin] Rash      Social History   Socioeconomic History   Marital status: Single    Spouse name: Not on file   Number of children: Not on file   Years of education: Not on file   Highest education level: Not on file  Occupational History   Not on file  Tobacco Use    Smoking status: Never   Smokeless tobacco: Never  Vaping Use   Vaping Use: Never used  Substance and Sexual Activity   Alcohol use: Not Currently   Drug use: Not Currently   Sexual activity: Yes  Other Topics Concern   Not on file  Social History Narrative   Not on file   Social Determinants of Health   Financial Resource Strain: Not on file  Food Insecurity: Not on file  Transportation Needs: Not on file  Physical Activity: Not on file  Stress: Not on file  Social Connections: Not on file  Intimate Partner Violence: Not on file      Family History  Problem Relation Age of Onset   Hypertension Mother    Heart failure Father    Hypertension Sister    Hypertension Sister    Hypertension Sister     Vitals:   06/16/22 1101  BP: 140/90  Pulse: 84  SpO2: 99%  Weight: 93.6 kg (206 lb 6.4 oz)   Wt Readings from Last 3 Encounters:  06/16/22 93.6 kg (206 lb 6.4 oz)  02/26/22 95.3 kg (210 lb)  02/17/22 93.9 kg (207 lb)    PHYSICAL EXAM: General: NAD Neck: No JVD, Mildly enlarged thyroid.   Lungs: Clear to auscultation bilaterally with normal respiratory effort. CV: Nondisplaced PMI.  Heart regular S1/S2, no S3/S4, no murmur.  No peripheral edema.  No carotid bruit.  Normal pedal pulses.  Abdomen: Soft, nontender, no hepatosplenomegaly, no distention.  Skin: Intact without lesions or rashes.  Neurologic: Alert and oriented x 3.  Psych: Normal affect. Extremities: No clubbing or cyanosis.  HEENT: Normal.   ASSESSMENT & PLAN:  1. 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 Echo in 10/20 with EF 20% and diffuse hypokinesis.  RFs include African-American, gestational diabetes, and pre-eclampsia. She was HIV negative.  Father has CHF, she is not certain about details.  She does not drink ETOH or use drugs. No chest pain, CAD is unlikely.  TSH has been normal.  Echo in 1/22 showed EF up to 35-40% with normal RV.  Cardiac MRI in 6/22 showed EF 39%, no LGE.  Echo in 1/23 with EF 40-45%.  She is not volume overloaded on exam. NYHA class I. - EF is out of ICD range.  - Continue Entresto 97/103 bid. BMET today.  - Continue Farxiga 10 mg daily.  - Continue spironolactone 25 mg daily.   - Continue Coreg 25 mg bid.   - Increase Bidil to 2 tabs tid.  - She understands risk of fetal toxicity w/ HF meds and also the risk of worsening HF if she were to get pregnant again.  She is using contraception.  - Repeat echo in 4 months at followup.  2. Palpitations/Presyncope: 11/2020 Zio Patch showed no significant arrhythmias.  3. Thyromegaly: Noted on exam.  - Check TSH.   Followup in 4 months with echo  Marca Ancona, MD 06/16/22

## 2022-06-16 NOTE — Patient Instructions (Signed)
Labs done today. We will contact you only if your labs are abnormal.  INCREASE Bidil to 2 tablets by mouth 3 times daily.   No other medication changes were made. Please continue all current medications as prescribed.  Your physician recommends that you schedule a follow-up appointment in: 4 months with an echo prior to your appointment. Please contact our office in October to schedule a November appointment.   Your physician has requested that you have an echocardiogram. Echocardiography is a painless test that uses sound waves to create images of your heart. It provides your doctor with information about the size and shape of your heart and how well your heart's chambers and valves are working. This procedure takes approximately one hour. There are no restrictions for this procedure.  If you have any questions or concerns before your next appointment please send [place] a message through [place] or call our office at [phone].    TO LEAVE A MESSAGE FOR THE NURSE SELECT OPTION 2, PLEASE LEAVE A MESSAGE INCLUDING: YOUR NAME DATE OF BIRTH CALL BACK NUMBER REASON FOR CALL**this is important as we prioritize the call backs  YOU WILL RECEIVE A CALL BACK THE SAME DAY AS LONG AS YOU CALL BEFORE 4:00 PM   Do the following things EVERYDAY: Weigh yourself in the morning before breakfast. Write it down and keep it in a log. Take your medicines as prescribed Eat low salt foods--Limit salt (sodium) to 2000 mg per day.  Stay as active as you can everyday Limit all fluids for the day to less than 2 liters   At the [facility], you and your health needs are our priority. As part of our continuing mission to provide you with exceptional heart care, we have created designated Provider Care Teams. These Care Teams include your primary Cardiologist (physician) and Advanced Practice Providers (APPs- Physician Assistants and Nurse Practitioners) who all work together to provide you with the  care you need, when you need it.   You may see any of the following providers on your designated Care Team at your next follow up: Dr Arvilla Meres Dr Carron Curie, NP Robbie Lis, [place] Karle Plumber, PharmD   Please be sure to bring in all your medications bottles to every appointment.

## 2022-06-24 ENCOUNTER — Other Ambulatory Visit: Payer: Self-pay

## 2022-06-24 ENCOUNTER — Other Ambulatory Visit
Admission: RE | Admit: 2022-06-24 | Discharge: 2022-06-24 | Disposition: A | Discharge status: Home / Self Care | Payer: MEDICAID | Source: Ambulatory Visit | Attending: Medical | Admitting: Medical

## 2022-06-24 DIAGNOSIS — Z118 Encounter for screening for other infectious and parasitic diseases: Secondary | ICD-10-CM | POA: Insufficient documentation

## 2022-06-24 DIAGNOSIS — Z113 Encounter for screening for infections with a predominantly sexual mode of transmission: Secondary | ICD-10-CM | POA: Insufficient documentation

## 2022-06-24 DIAGNOSIS — Z7251 High risk heterosexual behavior: Secondary | ICD-10-CM | POA: Insufficient documentation

## 2022-06-24 DIAGNOSIS — M545 Low back pain, unspecified: Secondary | ICD-10-CM | POA: Insufficient documentation

## 2022-06-24 MED ORDER — FLUCONAZOLE 150 MG PO TABS *I*
ORAL_TABLET | ORAL | 0 refills | Status: DC
Start: 2022-06-24 — End: 2022-09-18
  Filled 2022-06-24: qty 2, 6d supply, fill #0

## 2022-06-24 MED ORDER — METRONIDAZOLE 500 MG PO TABS *I*
500.0000 mg | ORAL_TABLET | Freq: Two times a day (BID) | ORAL | 0 refills | Status: AC
Start: 2022-06-24 — End: 2022-07-04
  Filled 2022-06-24: qty 14, 7d supply, fill #0

## 2022-06-25 LAB — N. GONORRHOEAE NAAT (PCR): N. gonorrhoeae NAAT (PCR): NEGATIVE

## 2022-06-25 LAB — TRICHOMONAS NAAT (PCR): Trichomonas NAAT (PCR): NEGATIVE

## 2022-06-25 LAB — CHLAMYDIA NAAT (PCR): Chlamydia NAAT (PCR): NEGATIVE

## 2022-06-26 ENCOUNTER — Other Ambulatory Visit: Payer: Self-pay

## 2022-06-26 LAB — AEROBIC CULTURE

## 2022-06-27 ENCOUNTER — Other Ambulatory Visit: Payer: Self-pay

## 2022-06-27 MED ORDER — NITROFURANTOIN MONOHYD MACRO 100 MG PO CAPS *I*
100.0000 mg | ORAL_CAPSULE | Freq: Two times a day (BID) | ORAL | 0 refills | Status: AC
Start: 2022-06-26 — End: 2022-07-02
  Filled 2022-06-27: qty 10, 5d supply, fill #0

## 2022-07-16 ENCOUNTER — Other Ambulatory Visit: Payer: Self-pay

## 2022-07-16 ENCOUNTER — Other Ambulatory Visit: Payer: Self-pay | Admitting: Medical

## 2022-07-16 DIAGNOSIS — Z3490 Encounter for supervision of normal pregnancy, unspecified, unspecified trimester: Secondary | ICD-10-CM

## 2022-07-16 MED ORDER — CLASSIC PRENATAL 28-0.8 MG PO TABS *I*
1.0000 | ORAL_TABLET | Freq: Every day | ORAL | 5 refills | Status: DC
Start: 2022-07-16 — End: 2023-12-15
  Filled 2022-07-16: qty 30, 30d supply, fill #0

## 2022-07-17 ENCOUNTER — Encounter: Payer: Self-pay | Admitting: Gastroenterology

## 2022-07-17 ENCOUNTER — Ambulatory Visit: Payer: PRIVATE HEALTH INSURANCE | Attending: Medical

## 2022-07-17 DIAGNOSIS — O355XX0 Maternal care for (suspected) damage to fetus by drugs, not applicable or unspecified: Secondary | ICD-10-CM

## 2022-07-17 DIAGNOSIS — Z3490 Encounter for supervision of normal pregnancy, unspecified, unspecified trimester: Secondary | ICD-10-CM | POA: Insufficient documentation

## 2022-07-17 DIAGNOSIS — Z6832 Body mass index (BMI) 32.0-32.9, adult: Secondary | ICD-10-CM

## 2022-07-17 DIAGNOSIS — O34219 Maternal care for unspecified type scar from previous cesarean delivery: Secondary | ICD-10-CM

## 2022-07-17 DIAGNOSIS — Z3687 Encounter for antenatal screening for uncertain dates: Secondary | ICD-10-CM

## 2022-07-17 DIAGNOSIS — O26851 Spotting complicating pregnancy, first trimester: Secondary | ICD-10-CM

## 2022-07-17 DIAGNOSIS — O99211 Obesity complicating pregnancy, first trimester: Secondary | ICD-10-CM

## 2022-07-17 DIAGNOSIS — O2621 Pregnancy care for patient with recurrent pregnancy loss, first trimester: Secondary | ICD-10-CM

## 2022-07-17 DIAGNOSIS — Z3A01 Less than 8 weeks gestation of pregnancy: Secondary | ICD-10-CM

## 2022-07-24 ENCOUNTER — Other Ambulatory Visit: Payer: Self-pay

## 2022-07-24 MED ORDER — XULANE 150-35 MCG/24HR TD PTWK
MEDICATED_PATCH | TRANSDERMAL | 3 refills | Status: DC
Start: 2022-07-24 — End: 2022-12-04
  Filled 2022-07-24: qty 3, 21d supply, fill #0
  Filled 2022-07-31: qty 3, 28d supply, fill #0

## 2022-07-25 ENCOUNTER — Other Ambulatory Visit: Payer: Self-pay

## 2022-07-31 ENCOUNTER — Other Ambulatory Visit: Payer: Self-pay | Admitting: Medical

## 2022-07-31 ENCOUNTER — Other Ambulatory Visit: Payer: Self-pay

## 2022-07-31 DIAGNOSIS — Z3490 Encounter for supervision of normal pregnancy, unspecified, unspecified trimester: Secondary | ICD-10-CM

## 2022-08-04 ENCOUNTER — Other Ambulatory Visit: Payer: Self-pay

## 2022-09-17 ENCOUNTER — Other Ambulatory Visit
Admission: RE | Admit: 2022-09-17 | Discharge: 2022-09-17 | Disposition: A | Discharge status: Home / Self Care | Payer: MEDICAID | Source: Ambulatory Visit | Attending: Medical | Admitting: Medical

## 2022-09-17 DIAGNOSIS — Z7251 High risk heterosexual behavior: Secondary | ICD-10-CM | POA: Insufficient documentation

## 2022-09-17 DIAGNOSIS — Z113 Encounter for screening for infections with a predominantly sexual mode of transmission: Secondary | ICD-10-CM | POA: Insufficient documentation

## 2022-09-17 DIAGNOSIS — Z118 Encounter for screening for other infectious and parasitic diseases: Secondary | ICD-10-CM | POA: Insufficient documentation

## 2022-09-18 ENCOUNTER — Other Ambulatory Visit: Payer: Self-pay

## 2022-09-18 LAB — CHLAMYDIA NAAT (PCR): Chlamydia NAAT (PCR): NEGATIVE

## 2022-09-18 LAB — N. GONORRHOEAE NAAT (PCR): N. gonorrhoeae NAAT (PCR): NEGATIVE

## 2022-09-18 LAB — TRICHOMONAS NAAT (PCR): Trichomonas NAAT (PCR): NEGATIVE

## 2022-09-18 MED ORDER — FLUCONAZOLE 150 MG PO TABS *I*
150.0000 mg | ORAL_TABLET | ORAL | 0 refills | Status: DC
Start: 2022-09-17 — End: 2022-10-30
  Filled 2022-09-18: qty 2, 6d supply, fill #0

## 2022-09-24 ENCOUNTER — Other Ambulatory Visit: Payer: Self-pay

## 2022-10-30 ENCOUNTER — Other Ambulatory Visit: Payer: Self-pay

## 2022-10-30 MED ORDER — FLUCONAZOLE 150 MG PO TABS *I*
150.0000 mg | ORAL_TABLET | ORAL | 0 refills | Status: DC
Start: 2022-10-30 — End: 2023-04-28
  Filled 2022-10-30: qty 2, 14d supply, fill #0

## 2022-10-30 MED ORDER — METRONIDAZOLE 0.75 % VA GEL *I*
1.0000 | Freq: Every day | VAGINAL | 0 refills | Status: AC
Start: 2022-10-30 — End: 2022-11-04
  Filled 2022-10-30: qty 70, 5d supply, fill #0

## 2022-12-04 ENCOUNTER — Other Ambulatory Visit: Payer: Self-pay

## 2022-12-04 MED ORDER — NORETHIN ACE-ETH ESTRAD-FE 1-20 MG-MCG PO TABS *A*
1.0000 | ORAL_TABLET | Freq: Every day | ORAL | 2 refills | Status: DC
Start: 2022-12-04 — End: 2023-12-23
  Filled 2022-12-04: qty 84, 84d supply, fill #0
  Filled 2023-04-27: qty 28, 28d supply, fill #1

## 2022-12-05 ENCOUNTER — Other Ambulatory Visit: Payer: Self-pay

## 2023-03-11 ENCOUNTER — Other Ambulatory Visit
Admission: RE | Admit: 2023-03-11 | Discharge: 2023-03-11 | Disposition: A | Discharge status: Home / Self Care | Payer: MEDICAID | Source: Ambulatory Visit | Attending: Family Medicine | Admitting: Family Medicine

## 2023-03-11 DIAGNOSIS — N760 Acute vaginitis: Secondary | ICD-10-CM | POA: Insufficient documentation

## 2023-03-11 DIAGNOSIS — Z7251 High risk heterosexual behavior: Secondary | ICD-10-CM | POA: Insufficient documentation

## 2023-03-11 DIAGNOSIS — Z711 Person with feared health complaint in whom no diagnosis is made: Secondary | ICD-10-CM | POA: Insufficient documentation

## 2023-03-11 DIAGNOSIS — Z0489 Encounter for examination and observation for other specified reasons: Secondary | ICD-10-CM | POA: Insufficient documentation

## 2023-03-12 ENCOUNTER — Other Ambulatory Visit: Payer: Self-pay

## 2023-03-12 ENCOUNTER — Other Ambulatory Visit
Admission: RE | Admit: 2023-03-12 | Discharge: 2023-03-12 | Disposition: A | Discharge status: Home / Self Care | Payer: MEDICAID | Source: Ambulatory Visit | Attending: Family Medicine | Admitting: Family Medicine

## 2023-03-12 DIAGNOSIS — Z0489 Encounter for examination and observation for other specified reasons: Secondary | ICD-10-CM | POA: Insufficient documentation

## 2023-03-12 DIAGNOSIS — Z711 Person with feared health complaint in whom no diagnosis is made: Secondary | ICD-10-CM | POA: Insufficient documentation

## 2023-03-12 DIAGNOSIS — Z7251 High risk heterosexual behavior: Secondary | ICD-10-CM | POA: Insufficient documentation

## 2023-03-12 LAB — HEPATITIS A,B,C,PANEL
HBV Core Ab: NEGATIVE
HBV S Ab Quant: 1.02 m[IU]/mL
HBV S Ab: NEGATIVE
HBV S Ag: NEGATIVE
Hep C Ab: NEGATIVE
Hepatitis A IGG: POSITIVE

## 2023-03-12 LAB — TRICHOMONAS NAAT (PCR): Trichomonas NAAT (PCR): NEGATIVE

## 2023-03-12 LAB — SYPHILIS SCREEN
Syphilis Screen: NEGATIVE
Syphilis Status: NONREACTIVE

## 2023-03-12 LAB — VAGINITIS SCREEN: DNA PROBE: Vaginitis Screen:DNA Probe: 0

## 2023-03-12 LAB — HSV 1 ANTIBODY, IGG: HSV 1 IgG: >8 AI

## 2023-03-12 LAB — HSV 2 ANTIBODY, IGG: HSV 2 IgG: >8 AI

## 2023-03-12 LAB — N. GONORRHOEAE NAAT (PCR): N. gonorrhoeae NAAT (PCR): NEGATIVE

## 2023-03-12 LAB — CHLAMYDIA NAAT (PCR): Chlamydia NAAT (PCR): NEGATIVE

## 2023-03-12 MED ORDER — METRONIDAZOLE 500 MG PO TABS *I*
500.0000 mg | ORAL_TABLET | Freq: Two times a day (BID) | ORAL | 0 refills | Status: AC
Start: 2023-03-11 — End: 2023-03-19
  Filled 2023-03-12: qty 14, 7d supply, fill #0

## 2023-03-12 MED ORDER — FLUCONAZOLE 150 MG PO TABS *I*
150.0000 mg | ORAL_TABLET | ORAL | 0 refills | Status: DC
Start: 2023-03-11 — End: 2023-04-28
  Filled 2023-03-12: qty 2, 6d supply, fill #0

## 2023-04-27 ENCOUNTER — Other Ambulatory Visit: Payer: Self-pay

## 2023-04-28 ENCOUNTER — Other Ambulatory Visit: Payer: Self-pay

## 2023-04-28 MED ORDER — FLUCONAZOLE 150 MG PO TABS *I*
150.0000 mg | ORAL_TABLET | ORAL | 0 refills | Status: DC
Start: 2023-04-28 — End: 2023-12-23
  Filled 2023-04-28 – 2023-05-08 (×2): qty 2, 6d supply, fill #0

## 2023-05-06 ENCOUNTER — Other Ambulatory Visit: Payer: Self-pay

## 2023-05-08 ENCOUNTER — Other Ambulatory Visit: Payer: Self-pay

## 2023-05-15 ENCOUNTER — Other Ambulatory Visit: Payer: Self-pay

## 2023-05-21 ENCOUNTER — Other Ambulatory Visit: Payer: Self-pay | Admitting: Podiatry

## 2023-05-21 ENCOUNTER — Ambulatory Visit (INDEPENDENT_AMBULATORY_CARE_PROVIDER_SITE_OTHER): Payer: BLUE CROSS/BLUE SHIELD

## 2023-05-21 ENCOUNTER — Ambulatory Visit: Payer: BLUE CROSS/BLUE SHIELD | Admitting: Podiatry

## 2023-05-21 ENCOUNTER — Encounter: Payer: Self-pay | Admitting: Podiatry

## 2023-05-21 DIAGNOSIS — M722 Plantar fascial fibromatosis: Secondary | ICD-10-CM

## 2023-05-21 DIAGNOSIS — M778 Other enthesopathies, not elsewhere classified: Secondary | ICD-10-CM | POA: Diagnosis not present

## 2023-05-21 DIAGNOSIS — I10 Essential (primary) hypertension: Secondary | ICD-10-CM | POA: Insufficient documentation

## 2023-05-21 MED ORDER — TRIAMCINOLONE ACETONIDE 40 MG/ML IJ SUSP
40.0000 mg | Freq: Once | INTRAMUSCULAR | Status: AC
  Administered 2023-05-21: 40 mg

## 2023-05-21 MED ORDER — MELOXICAM 15 MG PO TABS: 15.0000 mg | ORAL_TABLET | Freq: Every day | ORAL | 3 refills | Status: DC

## 2023-05-21 MED ORDER — METHYLPREDNISOLONE 4 MG PO TBPK: ORAL_TABLET | ORAL | 0 refills | Status: DC

## 2023-05-21 NOTE — Progress Notes (Signed)
Subjective:  Patient ID: Robin Gallegos, female    DOB: 1990-12-20,  MRN: [id number] HPI Chief Complaint  Patient presents with   Foot Pain    Anterior ankle and lateral foot bilateral/achilles right - sharp, sudden pains when walking or standing x 2 months, extreme pain at end of day after work, tried Ibuprofen    32 y.o. female presents with the above complaint.   ROS denies fever chills nausea vomit muscle aches pains calf pain back pain chest pain shortness of breath.  Past Medical History:  Diagnosis Date   CHF (congestive heart failure) (HCC)    Pre-eclampsia    No past surgical history on file.  Current Outpatient Medications:    JUNEL FE 1/20 1-20 MG-MCG tablet, Take 1 tablet by mouth daily., Disp: , Rfl:    meloxicam (MOBIC) 15 MG tablet, Take 1 tablet (15 mg total) by mouth daily., Disp: 30 tablet, Rfl: 3   methylPREDNISolone (MEDROL DOSEPAK) 4 MG TBPK tablet, 6 day dose pack - take as directed, Disp: 21 tablet, Rfl: 0   carvedilol (COREG) 25 MG tablet, TAKE 1 TABLET (25 MG TOTAL) BY MOUTH 2 (TWO) TIMES DAILY WITH A MEAL., Disp: 180 tablet, Rfl: 3   dapagliflozin propanediol (FARXIGA) 10 MG TABS tablet, Take 1 tablet (10 mg total) by mouth daily before breakfast., Disp: 30 tablet, Rfl: 11   furosemide (LASIX) 20 MG tablet, Take 1 tablet (20 mg total) by mouth as needed (3lb weight gain overnight or 5lb weightgain in a week)., Disp: 30 tablet, Rfl: 11   isosorbide-hydrALAZINE (BIDIL) 20-37.5 MG tablet, Take 2 tablets by mouth 3 (three) times daily., Disp: 180 tablet, Rfl: 5   sacubitril-valsartan (ENTRESTO) 97-103 MG, Take 1 tablet by mouth 2 (two) times daily., Disp: 180 tablet, Rfl: 3   spironolactone (ALDACTONE) 25 MG tablet, TAKE 2 TABLETS BY MOUTH EVERY DAY, Disp: 180 tablet, Rfl: 3  Allergies  Allergen Reactions   Penicillins Hives    Did it involve swelling of the face/tongue/throat, SOB, or low BP? Unknown Did it involve sudden or severe rash/hives, skin peeling,  or any reaction on the inside of your mouth or nose? Unknown Did you need to seek medical attention at a hospital or doctor's office? Unknown When did it last happen?     pt was a child  If all above answers are "NO", may proceed with cephalosporin use.    Cleocin [Clindamycin] Rash   Review of Systems Objective:  There were no vitals filed for this visit.  General: Well developed, nourished, in no acute distress, alert and oriented x3   Dermatological: Skin is warm, dry and supple bilateral. Nails x 10 are well maintained; remaining integument appears unremarkable at this time. There are no open sores, no preulcerative lesions, no rash or signs of infection present.  Vascular: Dorsalis Pedis artery and Posterior Tibial artery pedal pulses are 2/4 bilateral with immedate capillary fill time. Pedal hair growth present. No varicosities and no lower extremity edema present bilateral.   Neruologic: Grossly intact via light touch bilateral. Vibratory intact via tuning fork bilateral. Protective threshold with Semmes Wienstein monofilament intact to all pedal sites bilateral. Patellar and Achilles deep tendon reflexes 2+ bilateral. No Babinski or clonus noted bilateral.   Musculoskeletal: No gross boney pedal deformities bilateral. No pain, crepitus, or limitation noted with foot and ankle range of motion bilateral. Muscular strength 5/5 in all groups tested bilateral.  Pain on palpation medial calcaneal tubercles bilateral.  She has no pain on  palpation of the Achilles.  Mild tenderness on palpation posterior tibial tendons bilateral no pain on inversion against resistance.  The anterior ankle and the dorsum of the foot mildly tender on palpation.  Gait: Unassisted, Nonantalgic.    Radiographs:  Radiographs taken today do not demonstrate any type of osseous abnormalities only soft tissue increase in density plantar calcaneal insertion site right over left.  Other soft tissue margins appear to be  normal.  Assessment & Plan:   Assessment: Planter fasciitis with compensatory syndrome bilateral.  Plan: Discussed etiology pathology conservative surgical therapies at this point I will start her on methylprednisolone to be followed by meloxicam.  She will watch her blood pressures on a daily basis with this.  Also injected the bilateral heels and placed him plantar fascia braces bilateral and in discussed appropriate shoe gear stretching exercise ice therapy sugar modifications were going to get her scheduled to have orthotics made.     [place]serrat Shek T. Dav[place], [place]

## 2023-06-04 ENCOUNTER — Telehealth (HOSPITAL_COMMUNITY): Payer: Self-pay

## 2023-06-04 NOTE — Telephone Encounter (Signed)
My chart message sent in relation to [facility] paper work

## 2023-07-07 ENCOUNTER — Ambulatory Visit: Payer: BLUE CROSS/BLUE SHIELD | Admitting: Podiatry

## 2023-07-07 DIAGNOSIS — M722 Plantar fascial fibromatosis: Secondary | ICD-10-CM | POA: Diagnosis not present

## 2023-07-07 NOTE — Progress Notes (Signed)
She presents today for follow-up of her bilateral Planter fasciitis she states that the left foot is 100% improved the right heel is about 60% improved.  She states that she continues to take her meloxicam.  Objective: Also stable oriented x 3 no pain on palpation MucoClear tubercle of the left heel she does however have pain on palpation medial calcaneal tubercle of the right heel.  Assessment: Planter fasciitis righ heel 100% resolved fasciitis left heel.  Plan: Reinjected the right heel today 20 mg Kenalog 5 mg Marcaine point of maximal tenderness.  Tolerated procedure well without complications was given both oral and home-going structures for the care of this Planter fasciitis also discussed appropriate shoe gear and the continuation of the meloxicam.  Follow-up with her on a as needed basis

## 2023-07-29 ENCOUNTER — Other Ambulatory Visit
Admission: RE | Admit: 2023-07-29 | Discharge: 2023-07-29 | Disposition: A | Discharge status: Home / Self Care | Payer: MEDICAID | Source: Ambulatory Visit | Attending: Internal Medicine | Admitting: Internal Medicine

## 2023-07-29 ENCOUNTER — Other Ambulatory Visit: Payer: Self-pay

## 2023-07-29 DIAGNOSIS — Z0489 Encounter for examination and observation for other specified reasons: Secondary | ICD-10-CM | POA: Insufficient documentation

## 2023-07-29 DIAGNOSIS — Z7251 High risk heterosexual behavior: Secondary | ICD-10-CM | POA: Insufficient documentation

## 2023-07-29 DIAGNOSIS — Z711 Person with feared health complaint in whom no diagnosis is made: Secondary | ICD-10-CM | POA: Insufficient documentation

## 2023-07-29 MED ORDER — METRONIDAZOLE 500 MG PO TABS *I*
500.0000 mg | ORAL_TABLET | Freq: Two times a day (BID) | ORAL | 0 refills | Status: AC
Start: 2023-07-29 — End: 2023-08-05
  Filled 2023-07-29: qty 14, 7d supply, fill #0

## 2023-07-29 MED ORDER — FLUCONAZOLE 100 MG PO TABS *I*
100.0000 mg | ORAL_TABLET | ORAL | 0 refills | Status: DC
Start: 2023-07-29 — End: 2023-12-23
  Filled 2023-07-29: qty 2, 6d supply, fill #0

## 2023-07-30 LAB — N. GONORRHOEAE NAAT (PCR): N. gonorrhoeae NAAT (PCR): NEGATIVE

## 2023-07-30 LAB — VAGINITIS SCREEN: DNA PROBE: Vaginitis Screen:DNA Probe: POSITIVE — AB

## 2023-07-30 LAB — TRICHOMONAS NAAT (PCR): Trichomonas NAAT (PCR): NEGATIVE

## 2023-07-30 LAB — CHLAMYDIA NAAT (PCR): Chlamydia NAAT (PCR): NEGATIVE

## 2023-08-28 ENCOUNTER — Other Ambulatory Visit
Admission: RE | Admit: 2023-08-28 | Discharge: 2023-08-28 | Disposition: A | Discharge status: Home / Self Care | Payer: MEDICAID | Source: Ambulatory Visit

## 2023-08-28 ENCOUNTER — Other Ambulatory Visit: Payer: Self-pay

## 2023-08-28 ENCOUNTER — Other Ambulatory Visit
Admission: RE | Admit: 2023-08-28 | Discharge: 2023-08-28 | Disposition: A | Discharge status: Home / Self Care | Payer: MEDICAID | Source: Ambulatory Visit | Attending: Registered Nurse | Admitting: Registered Nurse

## 2023-08-28 DIAGNOSIS — Z202 Contact with and (suspected) exposure to infections with a predominantly sexual mode of transmission: Secondary | ICD-10-CM | POA: Insufficient documentation

## 2023-08-28 DIAGNOSIS — N898 Other specified noninflammatory disorders of vagina: Secondary | ICD-10-CM | POA: Insufficient documentation

## 2023-08-28 MED ORDER — METRONIDAZOLE 0.75 % VA GEL *I*
VAGINAL | 0 refills | Status: DC
Start: 2023-08-28 — End: 2023-12-23
  Filled 2023-08-28: qty 70, 5d supply, fill #0

## 2023-08-29 LAB — CHLAMYDIA NAAT (PCR): Chlamydia NAAT (PCR): NEGATIVE

## 2023-08-29 LAB — SYPHILIS SCREEN
Syphilis Screen: NEGATIVE
Syphilis Status: NONREACTIVE

## 2023-08-29 LAB — N. GONORRHOEAE NAAT (PCR): N. gonorrhoeae NAAT (PCR): NEGATIVE

## 2023-08-29 LAB — HEPATITIS B & C PROF
HBV Core Ab: NEGATIVE
HBV S Ab Quant: 1.23 m[IU]/mL
HBV S Ab: NEGATIVE
HBV S Ag: NEGATIVE
Hep C Ab: NEGATIVE

## 2023-08-29 LAB — TRICHOMONAS NAAT: Trichomonas NAAT: NEGATIVE

## 2023-08-29 LAB — HIV-1/2  ANTIGEN/ANTIBODY SCREEN WITH CONFIRMATION: HIV 1&2 ANTIGEN/ANTIBODY: NONREACTIVE

## 2023-08-29 LAB — VAGINITIS SCREEN: DNA PROBE: Vaginitis Screen:DNA Probe: 0

## 2023-09-08 ENCOUNTER — Other Ambulatory Visit: Payer: Self-pay

## 2023-09-08 MED ORDER — CLOTRIMAZOLE 1 % VA CREA *I*
TOPICAL_CREAM | VAGINAL | 0 refills | Status: DC
Start: 2023-09-08 — End: 2023-12-23
  Filled 2023-09-08: qty 45, 7d supply, fill #0

## 2023-09-30 ENCOUNTER — Other Ambulatory Visit
Admission: RE | Admit: 2023-09-30 | Discharge: 2023-09-30 | Disposition: A | Discharge status: Home / Self Care | Payer: MEDICAID | Source: Ambulatory Visit | Attending: Family Medicine | Admitting: Family Medicine

## 2023-09-30 DIAGNOSIS — Z0489 Encounter for examination and observation for other specified reasons: Secondary | ICD-10-CM | POA: Insufficient documentation

## 2023-09-30 DIAGNOSIS — Z7251 High risk heterosexual behavior: Secondary | ICD-10-CM | POA: Insufficient documentation

## 2023-09-30 DIAGNOSIS — Z711 Person with feared health complaint in whom no diagnosis is made: Secondary | ICD-10-CM | POA: Insufficient documentation

## 2023-09-30 DIAGNOSIS — R300 Dysuria: Secondary | ICD-10-CM | POA: Insufficient documentation

## 2023-10-01 ENCOUNTER — Encounter: Payer: Self-pay | Admitting: Podiatrist

## 2023-10-01 ENCOUNTER — Ambulatory Visit: Payer: BLUE CROSS/BLUE SHIELD | Admitting: Podiatrist

## 2023-10-01 ENCOUNTER — Other Ambulatory Visit: Payer: Self-pay

## 2023-10-01 DIAGNOSIS — M7750 Other enthesopathy of unspecified foot: Secondary | ICD-10-CM

## 2023-10-01 LAB — AEROBIC CULTURE: Aerobic Culture: 0

## 2023-10-01 MED ORDER — NITROFURANTOIN MONOHYD MACRO 100 MG PO CAPS *I*
ORAL_CAPSULE | ORAL | 0 refills | Status: DC
Start: 2023-09-30 — End: 2023-12-23
  Filled 2023-10-01 – 2023-10-12 (×2): qty 14, 7d supply, fill #0

## 2023-10-01 NOTE — Progress Notes (Signed)
  Chief Complaint  Patient presents with   Foot Pain    RIGHT FOOT PAIN, INJECTION HELPED, HAS FINISHED THE MELOXICAM, HAS BEEN WEARING BRACE( NOT HELPING ANYWHERE, IT WAS IN THE BEGINNING), DOESN'T WANT ANOTHER INJECTION, WANTS TO KNOW WHAT THE NEXT STEPS CAN BE      HPI: Patient is 32 y.o. female who presents today for follow-up of right foot pain.  She relates injection helped she finished the meloxicam and has been wearing the brace.  She relates that the brace is not helping and she is not interested in another injection at today's visit.  She relates the pain she was experiencing the left foot is significantly improved and she is not having problems on the left any longer.   Allergies  Allergen Reactions   Penicillins Hives    Did it involve swelling of the face/tongue/throat, SOB, or low BP? Unknown Did it involve sudden or severe rash/hives, skin peeling, or any reaction on the inside of your mouth or nose? Unknown Did you need to seek medical attention at a hospital or doctor's office? Unknown When did it last happen?     pt was a child  If all above answers are "NO", may proceed with cephalosporin use.    Cleocin [Clindamycin] Rash    Review of systems is negative except as noted in the HPI.  Denies nausea/ vomiting/ fevers/ chills or night sweats.   Denies difficulty breathing, denies calf pain or tenderness  Physical Exam  Patient is awake, alert, and oriented x 3.  In no acute distress.    Vascular status is intact with palpable pedal pulses DP and PT bilateral and capillary refill time less than 3 seconds bilateral.  No edema or erythema noted.   Neurological exam reveals epicritic and protective sensation grossly intact bilateral.   Dermatological exam reveals skin is supple and dry to bilateral feet.  No open lesions present.    Musculoskeletal exam: No pain left foot plantar fascial pain has resolved. Right foot pain along the Achilles tendon at its insertion and  course of the tendon proximally.  Plantar fascial pain has improved significantly right foot.  Assessment:   ICD-10-CM   1. Tendonitis of ankle or foot  M77.50       Plan: Discussed treatment options and alternatives.  Because she does not want another injection I recommended boot therapy.  A short air fracture walker was dispensed as well as heel lifts to wear in her regular shoes and boot.  Also dispensed stretching exercises for Achilles tendinitis and also discussed the possibility of physical therapy in the future.  She will be seen back for follow-up in 1 month with Dr. Al Corpus if she would like to try physical therapy prior to that visit she will call we can order it for her at that time.

## 2023-10-01 NOTE — Patient Instructions (Signed)
Achilles Tendinitis Rehab Ask your health care provider which exercises are safe for you. Do exercises exactly as told by your provider and adjust them as directed. It is normal to feel mild stretching, pulling, tightness, or discomfort as you do these exercises. Stop right away if you feel sudden pain or your pain gets worse. Do not begin these exercises until told by your provider. Stretching and range-of-motion exercises These exercises warm up your muscles and joints. They improve the movement and flexibility of your ankle. These exercises also help to relieve pain. Standing wall calf stretch with straight knee  Stand with your hands against a wall. Extend your left / right leg behind you, and bend your front knee slightly. Keep both of your heels on the floor. Point the toes of your back foot slightly inward. Keeping your heels on the floor and your back knee straight, shift your weight toward the wall. Do not let your back arch. You should feel a gentle stretch in your upper calf. Hold this position for __________ seconds. Repeat __________ times. Complete this exercise __________ times a day. Standing wall calf stretch with bent knee  Stand with your hands against a wall. Extend your left / right leg behind you, and bend your front knee slightly. Keep both of your heels on the floor. Point the toes of your back foot slightly inward. Keeping your heels on the floor, bend your back knee slightly. You should feel a gentle stretch deep in your lower calf near your heel. Hold this position for __________ seconds. Repeat __________ times. Complete this exercise __________ times a day. Strengthening exercises These exercises build strength and control of your ankle. Endurance is the ability to use your muscles for a long time, even after they get tired. Plantar flexion with band In this exercise, you push your toes downward, away from you, with an exercise band providing resistance. Sit on  the floor with your left / right leg extended. You may put a pillow under your calf to give your foot more room to move. Loop a rubber exercise band or tube around the ball of your left / right foot. The ball of your foot is on the walking surface, right under your toes. The band or tube should be slightly tense when your foot is relaxed. If the band or tube slips, you can put on your shoe or put a washcloth between the band and your foot to help it stay in place. Slowly point your toes downward, pushing them away from you (plantar flexion). Hold this position for __________ seconds. Slowly release the tension in the band or tube, controlling smoothly until your foot is back to the starting position. Repeat steps 1-5 with your left / right leg. Repeat __________ times. Complete this exercise __________ times a day. Eccentric heel drop  In this exercise, you stand and slowly raise your heel and then slowly lower it. This exercise lengthens the calf muscles (eccentric) while the foot bears weight. If this exercise is too easy, try doing it while wearing a backpack with weights in it. Stand on a step with the balls of your feet. The ball of your foot is on the walking surface, right under your toes. Do not put your heels on the step. For balance, rest your hands on the wall or on a railing. Rise up onto the balls of your feet. Keeping your heels up, shift all of your weight to your left / right leg and pick up your  other leg. Slowly lower your left / right leg so your heel drops below the level of the step. Put down your other foot before going back to the start position. If told by your provider, build up to: 3 sets of 15 repetitions while keeping your knees straight. 3 sets of 15 repetitions while keeping your knees slightly bent as far as told by your provider. Repeat __________ times. Complete this exercise __________ times a day. Balance exercises These exercises improve or maintain your  balance. Balance helps to prevent falls. Single leg stand If this exercise is too easy, you can try it with your eyes closed or while standing on a pillow. Without shoes, stand near a railing or in a doorframe. Hold on to the railing or doorframe as needed. Stand on your left / right foot. Keep your big toe down on the floor and try to keep your arch lifted. You should feel a stretch across the bottom of your foot and arch. Do not let your foot roll inward. Hold this position for __________ seconds. Repeat __________ times. Complete this exercise __________ times a day. This information is not intended to replace advice given to you by your health care provider. Make sure you discuss any questions you have with your health care provider. Document Revised: 09/09/2022 Document Reviewed: 09/09/2022 Elsevier Patient Education  2024 [employer].

## 2023-10-02 LAB — N. GONORRHOEAE NAAT (PCR): N. gonorrhoeae NAAT (PCR): NEGATIVE

## 2023-10-02 LAB — TRICHOMONAS NAAT: Trichomonas NAAT: NEGATIVE

## 2023-10-02 LAB — CHLAMYDIA NAAT (PCR): Chlamydia NAAT (PCR): NEGATIVE

## 2023-10-06 ENCOUNTER — Encounter (HOSPITAL_COMMUNITY): Payer: Self-pay | Admitting: Cardiology

## 2023-10-06 ENCOUNTER — Ambulatory Visit (HOSPITAL_COMMUNITY)
Admission: RE | Admit: 2023-10-06 | Discharge: 2023-10-06 | Disposition: A | Discharge status: Home / Self Care | Payer: BLUE CROSS/BLUE SHIELD | Source: Ambulatory Visit | Attending: Cardiology | Admitting: Cardiology

## 2023-10-06 ENCOUNTER — Other Ambulatory Visit: Payer: Self-pay

## 2023-10-06 VITALS — BP 140/90 | HR 109 | Wt 235.8 lb

## 2023-10-06 DIAGNOSIS — I5022 Chronic systolic (congestive) heart failure: Secondary | ICD-10-CM | POA: Diagnosis present

## 2023-10-06 LAB — CBC
HCT: 36.7 % (ref 36.0–46.0)
Hemoglobin: 11.7 g/dL — ABNORMAL LOW (ref 12.0–15.0)
MCH: 25.6 pg — ABNORMAL LOW (ref 26.0–34.0)
MCHC: 31.9 g/dL (ref 30.0–36.0)
MCV: 80.3 fL (ref 80.0–100.0)
Platelets: 410 10*3/uL — ABNORMAL HIGH (ref 150–400)
RBC: 4.57 MIL/uL (ref 3.87–5.11)
RDW: 14.6 % (ref 11.5–15.5)
WBC: 8.8 10*3/uL (ref 4.0–10.5)
nRBC: 0 % (ref 0.0–0.2)

## 2023-10-06 LAB — COMPREHENSIVE METABOLIC PANEL
ALT: 17 U/L (ref 0–44)
AST: 25 U/L (ref 15–41)
Albumin: 3.6 g/dL (ref 3.5–5.0)
Alkaline Phosphatase: 36 U/L — ABNORMAL LOW (ref 38–126)
Anion gap: 6 (ref 5–15)
BUN: 12 mg/dL (ref 6–20)
CO2: 26 mmol/L (ref 22–32)
Calcium: 9.2 mg/dL (ref 8.9–10.3)
Chloride: 106 mmol/L (ref 98–111)
Creatinine, Ser: 0.96 mg/dL (ref 0.44–1.00)
GFR, Estimated: >60 mL/min (ref >60)
Glucose, Bld: 117 mg/dL — ABNORMAL HIGH (ref 70–99)
Potassium: 4.3 mmol/L (ref 3.5–5.1)
Sodium: 138 mmol/L (ref 135–145)
Total Bilirubin: 0.6 mg/dL (ref 0.3–1.2)
Total Protein: 7 g/dL (ref 6.5–8.1)

## 2023-10-06 LAB — TSH: TSH: 1.577 u[IU]/mL (ref 0.350–4.500)

## 2023-10-06 LAB — BRAIN NATRIURETIC PEPTIDE: B Natriuretic Peptide: 17.1 pg/mL (ref 0.0–100.0)

## 2023-10-06 MED ORDER — SPIRONOLACTONE 25 MG PO TABS: 50.0000 mg | ORAL_TABLET | Freq: Every day | ORAL | 3 refills | Status: AC

## 2023-10-06 MED ORDER — ENTRESTO 97-103 MG PO TABS: 1.0000 | ORAL_TABLET | Freq: Two times a day (BID) | ORAL | 3 refills | Status: DC

## 2023-10-06 MED ORDER — CARVEDILOL 25 MG PO TABS: 25.0000 mg | ORAL_TABLET | Freq: Two times a day (BID) | ORAL | 3 refills | Status: AC

## 2023-10-06 MED ORDER — DAPAGLIFLOZIN PROPANEDIOL 10 MG PO TABS
10.0000 mg | ORAL_TABLET | Freq: Every day | ORAL | 11 refills | Status: AC
Start: 2023-10-06 — End: ?

## 2023-10-06 NOTE — Patient Instructions (Addendum)
RESTART Carvedilol 25 mg Twice daily  RESTART Entresto 97/103 mg Twice daily  RESTART Spironolactone 50 mg daily.  RESTART Farxiga 10 mg daily.  Labs done today, your results will be available in MyChart, we will contact you for abnormal readings.  Your physician has requested that you have an echocardiogram. Echocardiography is a painless test that uses sound waves to create images of your heart. It provides your doctor with information about the size and shape of your heart and how well your heart's chambers and valves are working. This procedure takes approximately one hour. There are no restrictions for this procedure. Please do NOT wear cologne, perfume, aftershave, or lotions (deodorant is allowed). Please arrive 15 minutes prior to your appointment time.  Your physician recommends that you schedule a follow-up appointment as scheduled  If you have any questions or concerns before your next appointment please send [place] a message through [place] or call our office at [phone].    TO LEAVE A MESSAGE FOR THE NURSE SELECT OPTION 2, PLEASE LEAVE A MESSAGE INCLUDING: YOUR NAME DATE OF BIRTH CALL BACK NUMBER REASON FOR CALL**this is important as we prioritize the call backs  YOU WILL RECEIVE A CALL BACK THE SAME DAY AS LONG AS YOU CALL BEFORE 4:00 PM  At the [facility], you and your health needs are our priority. As part of our continuing mission to provide you with exceptional heart care, we have created designated Provider Care Teams. These Care Teams include your primary Cardiologist (physician) and Advanced Practice Providers (APPs- Physician Assistants and Nurse Practitioners) who all work together to provide you with the care you need, when you need it.   You may see any of the following providers on your designated Care Team at your next follow up: Dr Arvilla Meres Dr Marca Ancona Dr. Dorthula Nettles Dr. Clearnce Hasten Amy Filbert Schilder, NP Robbie Lis, [place] [facility], [place] Brynda Peon, NP [place] Lee, NP Karle Plumber, PharmD   Please be sure to bring in all your medications bottles to every appointment.    Thank you for choosing [facility]

## 2023-10-07 ENCOUNTER — Other Ambulatory Visit: Payer: Self-pay

## 2023-10-07 NOTE — Progress Notes (Signed)
Advanced Heart Failure Clinic Note    PCP: Patient, No Pcp Per PCP-Cardiologist: Dr. Shirlee Latch  HPI:  Robin Gallegos is a 32 y.o. female G1P1. Pregnancy in 2020 was complicated by preeclampsia and gestational diabetes. She presented at [redacted] weeks pregnant w/ SOB and dizziness on 08/11/19 and was admitted for preeclampsia. Delivered a healthy girl. Post delivery course was uncomplicated. She was discharged home on nifedipine for further BP management. Nifedipine ultimately discontinued at post-partum f/u by OB.  She presented to the [facility] ED in 10/20 w/ dyspnea and palpitations.  Echo in 10/20 showed reduced EF at 20%. HIV negative. No FH of cardiomyopathy or premature coronary disease.  No baseline coronary risk factors and no h/o chest pain. Slight hs-TnI elevation (18=>21) consistent with CHF. Not c/w SCAD and no clear RWMAs on echo. TSH normal. 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 She ws diuresed w/ IV Lasix w/ symptomatic improvement and improvement in volume. Diuresed to euvolemic state and placed on guideline-directed medical therapy. Started on daily PO lasix 40 mg daily. Pt counseled on the need for contraception while on cardiac meds, particularly Entresto. Discharge wt 191 lb.   Echo in 1/22 showed EF 35-40% with diffuse hypokinesis, normal RV size and systolic function, PASP 22, normal IVC.   Echo in 1/23 showed EF 40-45%, mild LV dilation, normal RV.   She returns for followup of CHF.  She has been out of all her medications for at least 2 months.  I have not seen her in over a year. Weight is up 19 lbs. BP is elevated.  She has been more short of breath since running out of medications. She continues to work at [employer] but gets short of breath at work. No lightheadedness.  She has orthopnea, requires 3 pillows.  No dyspnea walking into the office.  No chest pain.    Labs (11/21): K 4.1, creatinine 0.78 Labs (1/22): BNP 43 Labs (2/22): K 4.1, creatinine 0.83 Labs (10/22): K 4.1, creatinine  0.81 Labs (3/23): BNP 28, K 4.2, creatinine 0.92 Labs (7/23): K 4.4, creatinine 0.79, TSH normal  PMH: 1. 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   - Echo (10/20): EF 20%.  - Echo (4/21): EF 20-25%  - Echo (1/22): EF 35-40%, diffuse hypokinesis, normal RV, PASP 22, IVC normal.  - Cardiac MRI (6/22): EF 39%, RV EF 49%, no LGE.  - Echo (1/23): EF 40-45%, mild LV dilation, normal RV 2. Pre-eclampsia 3. Zio patch 12/21 with no significant arrhythmias.    Current Outpatient Medications  Medication Sig Dispense Refill   furosemide (LASIX) 20 MG tablet Take 1 tablet (20 mg total) by mouth as needed (3lb weight gain overnight or 5lb weightgain in a week). 30 tablet 11   JUNEL FE 1/20 1-20 MG-MCG tablet Take 1 tablet by mouth daily.     meloxicam (MOBIC) 15 MG tablet Take 1 tablet (15 mg total) by mouth daily. 30 tablet 3   carvedilol (COREG) 25 MG tablet Take 1 tablet (25 mg total) by mouth 2 (two) times daily with a meal. 180 tablet 3   dapagliflozin propanediol (FARXIGA) 10 MG TABS tablet Take 1 tablet (10 mg total) by mouth daily before breakfast. 30 tablet 11   sacubitril-valsartan (ENTRESTO) 97-103 MG Take 1 tablet by mouth 2 (two) times daily. 180 tablet 3   spironolactone (ALDACTONE) 25 MG tablet Take 2 tablets (50 mg total) by mouth daily. 180 tablet 3   No current facility-administered medications  for this encounter.    Allergies  Allergen Reactions   Penicillins Hives    Did it involve swelling of the face/tongue/throat, SOB, or low BP? Unknown Did it involve sudden or severe rash/hives, skin peeling, or any reaction on the inside of your mouth or nose? Unknown Did you need to seek medical attention at a hospital or doctor's office? Unknown When did it last happen?     pt was a child  If all above answers are "NO", may proceed with cephalosporin use.    Cleocin [Clindamycin] Rash      Social History    Socioeconomic History   Marital status: Single    Spouse name: Not on file   Number of children: Not on file   Years of education: Not on file   Highest education level: Not on file  Occupational History   Not on file  Tobacco Use   Smoking status: Never   Smokeless tobacco: Never  Vaping Use   Vaping status: Never Used  Substance and Sexual Activity   Alcohol use: Not Currently   Drug use: Not Currently   Sexual activity: Yes  Other Topics Concern   Not on file  Social History Narrative   Not on file   Social Determinants of Health   Financial Resource Strain: Not on file  Food Insecurity: Not on file  Transportation Needs: Not on file  Physical Activity: Not on file  Stress: Not on file  Social Connections: Not on file  Intimate Partner Violence: Not on file      Family History  Problem Relation Age of Onset   Hypertension Mother    Heart failure Father    Hypertension Sister    Hypertension Sister    Hypertension Sister     Vitals:   10/06/23 1552  BP: (!) 140/90  Pulse: (!) 109  SpO2: 98%  Weight: 107 kg (235 lb 12.8 oz)   Wt Readings from Last 3 Encounters:  10/06/23 107 kg (235 lb 12.8 oz)  06/16/22 93.6 kg (206 lb 6.4 oz)  02/26/22 95.3 kg (210 lb)    PHYSICAL EXAM: General: NAD Neck: JVP 8-9 cm with HJR, +thyromegaly.  Lungs: Clear to auscultation bilaterally with normal respiratory effort. CV: Nondisplaced PMI.  Heart regular S1/S2, no S3/S4, no murmur.  No peripheral edema.  No carotid bruit.  Normal pedal pulses.  Abdomen: Soft, nontender, no hepatosplenomegaly, no distention.  Skin: Intact without lesions or rashes.  Neurologic: Alert and oriented x 3.  Psych: Normal affect. Extremities: No clubbing or cyanosis.  HEENT: Normal.   ASSESSMENT & PLAN:  1. 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 Echo in 10/20 with EF 20% and diffuse hypokinesis.  RFs include African-American, gestational diabetes, and  pre-eclampsia. She was HIV negative.  Father has CHF, she is not certain about details.  She does not drink ETOH or use drugs. No chest pain, CAD is unlikely.  TSH has been normal.  Echo in 1/22 showed EF up to 35-40% with normal RV.  Cardiac MRI in 6/22 showed EF 39%, no LGE. Echo in 1/23 with EF 40-45%.  I have not seen her in over a year and she has been off her medications x 2 months.  Now with increased dyspnea, NYHA class III, and volume overloaded on exam.  - Restart Entresto 97/103 bid. BMET today.  - Restart Farxiga 10 mg daily.  - Restart spironolactone 25 mg daily.   - Restart Coreg 25 mg bid.   -  Hold off on Bidil for now, she says this caused a headache.   - She understands risk of fetal toxicity w/ HF meds and also the risk of worsening HF if she were to get pregnant again.  She is using contraception.  - I will arrange for echo.  - BMET/BNP today, BMET in 10 days.  2. Palpitations/Presyncope: 11/2020 Zio Patch showed no significant arrhythmias.  3. Thyromegaly: Noted on exam.  - Check TSH.   Followup in 1 week with APP.   Marca Ancona, MD 10/07/23

## 2023-10-08 ENCOUNTER — Other Ambulatory Visit: Payer: Self-pay

## 2023-10-12 ENCOUNTER — Encounter: Payer: Self-pay | Admitting: Podiatrist

## 2023-10-12 ENCOUNTER — Other Ambulatory Visit: Payer: Self-pay

## 2023-10-12 DIAGNOSIS — R300 Dysuria: Secondary | ICD-10-CM

## 2023-10-15 ENCOUNTER — Ambulatory Visit (HOSPITAL_COMMUNITY): Payer: Medicaid Other

## 2023-10-15 ENCOUNTER — Other Ambulatory Visit: Payer: Self-pay

## 2023-10-19 ENCOUNTER — Encounter (HOSPITAL_COMMUNITY): Payer: Self-pay

## 2023-10-20 ENCOUNTER — Other Ambulatory Visit: Payer: Self-pay

## 2023-10-20 DIAGNOSIS — Z0121 Encounter for dental examination and cleaning with abnormal findings: Secondary | ICD-10-CM

## 2023-10-20 MED ORDER — AMOXICILLIN 500 MG PO CAPS *I*
500.0000 mg | ORAL_CAPSULE | Freq: Three times a day (TID) | ORAL | 0 refills | Status: AC
Start: 2023-10-20 — End: 2023-10-27
  Filled 2023-10-20: qty 21, 7d supply, fill #0

## 2023-10-20 MED ORDER — ACETAMINOPHEN 650 MG PO TBCR *I*
650.0000 mg | ORAL_TABLET | Freq: Four times a day (QID) | ORAL | 0 refills | Status: DC
Start: 2023-10-20 — End: 2023-12-23
  Filled 2023-10-20: qty 20, 5d supply, fill #0

## 2023-10-22 ENCOUNTER — Ambulatory Visit: Payer: BLUE CROSS/BLUE SHIELD | Admitting: Podiatry

## 2023-10-29 ENCOUNTER — Telehealth: Payer: Self-pay | Admitting: Podiatrist

## 2023-10-29 NOTE — Telephone Encounter (Signed)
Charleene Korber called .Marland Kitchen...   Sd will be by later today to pick up the doctors note from 10/12/2023.  She was going to pick it up earlier this month but got delayed.  Confirmed letter was still downstairs behind the checkout desk .Marland Kitchen...      J. Abbott -- 10/29/2023

## 2023-11-05 ENCOUNTER — Encounter: Payer: Self-pay | Admitting: Podiatry

## 2023-11-05 ENCOUNTER — Ambulatory Visit: Payer: BLUE CROSS/BLUE SHIELD | Admitting: Podiatry

## 2023-11-05 DIAGNOSIS — M722 Plantar fascial fibromatosis: Secondary | ICD-10-CM | POA: Diagnosis not present

## 2023-11-05 MED ORDER — MELOXICAM 15 MG PO TABS: 15.0000 mg | ORAL_TABLET | Freq: Every day | ORAL | 3 refills | Status: DC

## 2023-11-05 MED ORDER — TRIAMCINOLONE ACETONIDE 40 MG/ML IJ SUSP
20.0000 mg | Freq: Once | INTRAMUSCULAR | Status: AC
  Administered 2023-11-05: 20 mg

## 2023-11-05 NOTE — Progress Notes (Signed)
She presents today for follow-up of her plantar fasciitis of her right foot.  States that is really starting to affect my ability perform her daily activities and get the sharp shooting pains and that the injections and I will just have not made it better.  Is doing better with initial injection and physical therapy did not help and the medication helped until I ran out.  She states that is affecting her ability to maintain her general good health and work well.  Objective: Vital signs stable alert oriented x 3.  Pulses are palpable.  Severe pain on palpation medial calcaneal tubercle with erythema and warmth on palpation.  She has some tenderness on palpation of the central band of the plantar fascia but primarily the mid majority of her symptoms are along the medial band.  She also has some mild tendo Achilles tendinitis at its insertion.  Assessment: Planter fasciitis possible plantar fascial tear right.  Insertional Achilles tendinitis most likely compensatory.  Plan: Discussed etiology pathology and surgical therapies at this point in time I injected the right heel today 20 mg Kenalog 5 mg Marcaine point maximal tenderness.  Tolerated the procedure well.  Started her back on her meloxicam.  We are going to request an MRI of the area for surgical intervention and differential diagnoses.

## 2023-11-11 ENCOUNTER — Encounter (HOSPITAL_COMMUNITY): Payer: Self-pay

## 2023-11-23 ENCOUNTER — Emergency Department
Admission: EM | Admit: 2023-11-23 | Discharge: 2023-11-23 | Disposition: A | Discharge status: Home / Self Care | Payer: MEDICAID | Source: Ambulatory Visit | Attending: Emergency Medicine | Admitting: Emergency Medicine

## 2023-11-23 ENCOUNTER — Encounter: Payer: Self-pay | Admitting: Emergency Medicine

## 2023-11-23 ENCOUNTER — Other Ambulatory Visit: Payer: Self-pay

## 2023-11-23 DIAGNOSIS — Z3A01 Less than 8 weeks gestation of pregnancy: Secondary | ICD-10-CM

## 2023-11-23 DIAGNOSIS — X500XXA Overexertion from strenuous movement or load, initial encounter: Secondary | ICD-10-CM | POA: Insufficient documentation

## 2023-11-23 DIAGNOSIS — Y990 Civilian activity done for income or pay: Secondary | ICD-10-CM | POA: Insufficient documentation

## 2023-11-23 DIAGNOSIS — O9A211 Injury, poisoning and certain other consequences of external causes complicating pregnancy, first trimester: Secondary | ICD-10-CM

## 2023-11-23 DIAGNOSIS — Y9289 Other specified places as the place of occurrence of the external cause: Secondary | ICD-10-CM | POA: Insufficient documentation

## 2023-11-23 DIAGNOSIS — Y93F2 Activity, caregiving, lifting: Secondary | ICD-10-CM | POA: Insufficient documentation

## 2023-11-23 DIAGNOSIS — X58XXXA Exposure to other specified factors, initial encounter: Secondary | ICD-10-CM

## 2023-11-23 DIAGNOSIS — Z3201 Encounter for pregnancy test, result positive: Secondary | ICD-10-CM | POA: Insufficient documentation

## 2023-11-23 DIAGNOSIS — S46011A Strain of muscle(s) and tendon(s) of the rotator cuff of right shoulder, initial encounter: Secondary | ICD-10-CM | POA: Insufficient documentation

## 2023-11-23 LAB — CBC AND DIFFERENTIAL
Baso # K/uL: 0 10*3/uL (ref 0.0–0.2)
Eos # K/uL: 0.2 10*3/uL (ref 0.0–0.5)
Hematocrit: 34 % (ref 34–49)
Hemoglobin: 11.6 g/dL (ref 11.2–16.0)
IMM Granulocytes #: 0 10*3/uL (ref 0.0–0.0)
IMM Granulocytes: 0.6 %
Lymph # K/uL: 1.8 10*3/uL (ref 1.0–5.0)
MCV: 93 fL (ref 75–100)
Mono # K/uL: 0.7 10*3/uL (ref 0.1–1.0)
Neut # K/uL: 3.7 10*3/uL (ref 1.5–6.5)
Nucl RBC # K/uL: 0 10*3/uL (ref 0.0–0.0)
Nucl RBC %: 0 /100{WBCs} (ref 0.0–0.2)
Platelets: 239 10*3/uL (ref 150–450)
RBC: 3.7 MIL/uL — ABNORMAL LOW (ref 4.0–5.5)
RDW: 11.9 % (ref 0.0–15.0)
Seg Neut %: 57.8 %
WBC: 6.4 10*3/uL (ref 3.5–11.0)

## 2023-11-23 LAB — BHCG, QUANT PREGNANCY: BHCG, QUANT PREGNANCY: 30124 m[IU]/mL — ABNORMAL HIGH (ref 0–1)

## 2023-11-23 MED ORDER — ACETAMINOPHEN 325 MG PO TABS *I*
650.0000 mg | ORAL_TABLET | Freq: Once | ORAL | Status: DC
Start: 2023-11-23 — End: 2023-11-23
  Filled 2023-11-23: qty 2

## 2023-11-23 NOTE — Discharge Instructions (Signed)
Rest as much as possible, you can apply ice to the right shoulder. You can take tylenol as needed for pain, no ibuprofen.     We sent a referral to establish care for your pregnancy. Watch for any abdominal pain or vaginal bleeding.

## 2023-11-23 NOTE — ED Provider Notes (Signed)
History     Chief Complaint   Patient presents with    Shoulder Pain    Pregnancy Problem     This is a 32 year old previous G1, P3 female who presents with 2 complaints today.  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  She is left-hand dominant.  She denies any neck pain or radicular complaints, describes the right shoulder pain as just soreness and is actually feeling a little bit better today.  She is here requesting a day off from work as she does not feel like she can safely lift her patient today.      Her second concern is possible pregnancy.  She admits that her last menstrual period was on November 1 and normal.  She reports intercourse on November 8 and is late for her menses by a few days.  She did take a urine pregnancy test this morning which was positive.  Yesterday she notes that she passed a very small red blood clot but has not had any residual bleeding or pain.  Denies any urinary complaints.  Otherwise she feels well.          Medical/Surgical/Family History     History reviewed. No pertinent past medical history.     There is no problem list on file for this patient.           History reviewed. No pertinent surgical history.                  Review of Systems   Gastrointestinal:  Negative for abdominal pain.   Musculoskeletal:  Negative for neck pain and neck stiffness.   All other systems reviewed and are negative.      Physical Exam     Triage Vitals  Triage Start: Start, (11/23/23 1610)  First Recorded BP: 122/72, Resp: 16, Temp: 36.3 C (97.3 F), Temp src: Infrared Oxygen Therapy SpO2: 98 %, Oximetry Source: Lt Hand, O2 Device: None (Room air), Heart Rate: 82, (11/23/23 0814)  .  First Pain Reported  0-10 Scale: 6, Pain Location/Orientation: Shoulder Right, Pain Descriptors: Aching, (11/23/23 0818)       Physical Exam  Vitals and nursing note reviewed.   Constitutional:       General:  She is not in acute distress.     Appearance: She is well-developed. She is not diaphoretic.   HENT:      Head: Normocephalic and atraumatic.      Right Ear: External ear normal.      Left Ear: External ear normal.      Nose: Nose normal.   Eyes:      Conjunctiva/sclera: Conjunctivae normal.      Pupils: Pupils are equal, round, and reactive to light.   Cardiovascular:      Rate and Rhythm: Normal rate and regular rhythm.      Heart sounds: Normal heart sounds. No murmur heard.  Pulmonary:      Effort: Pulmonary effort is normal.      Breath sounds: Normal breath sounds. No wheezing or rales.   Abdominal:      General: Bowel sounds are normal.      Palpations: Abdomen is soft.   Musculoskeletal:         General: No swelling or deformity. Normal range of motion.      Cervical back: Normal range of motion and neck  supple.      Comments: Examination of the left upper extremity does reveal relatively good range of motion although with some soreness of the left shoulder with reaching above her head.  No point tenderness on palpation.  Rotator cuff muscles are strong and intact with special testing.  Distal pulses are strong and sensation intact to light touch.   Lymphadenopathy:      Cervical: No cervical adenopathy.   Skin:     General: Skin is warm.   Neurological:      Mental Status: She is alert and oriented to person, place, and time.   Psychiatric:         Behavior: Behavior normal.         Thought Content: Thought content normal.         Judgment: Judgment normal.         Medical Decision Making   Patient seen by me on:  11/23/2023    Assessment:  This is a 32 year old G1 P32 female who presents emergency department with complaints of right shoulder pain after lifting a patient at work yesterday, as well as possible pregnancy.  Denies any other injuries or radicular complaints, is actually feeling better today.  Was a few days late for her menstrual cycle so she checked a urine pregnancy this morning which was  positive.  Notes passing of a small blood clot yesterday but no residual bleeding or vaginal discharge.  No urinary complaints.  Afebrile.    Differential diagnosis:  Rotator cuff strain, musculoskeletal soreness, unlikely fracture or significant injury, pregnancy.     Plan:  Labs Reviewed  CBC AND DIFFERENTIAL - Abnormal; Notable for the following components:     RBC                           3.7 (*)             All other components within normal limits  BHCG, QUANT PREGNANCY - Abnormal; Notable for the following components:     BHCG, QUANT PREGNANCY         30,124 (*)            All other components within normal limits    Educated patient, referral to OBGYN send to establish care. Work note for today with encouragement to follow up with PT/Occ Med if not improving.              [address] [place], [place]            Andrey Cota [place], [place]  11/23/23 1043

## 2023-11-23 NOTE — ED Triage Notes (Signed)
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 Difficulty moving shoulder. States she took a pregnancy test this AM which showed positive results, endorses passing a small blood clot. LMP 10/16/23.     Prehospital medications given: No

## 2023-11-24 ENCOUNTER — Telehealth: Payer: Self-pay

## 2023-11-24 ENCOUNTER — Encounter: Payer: Self-pay | Admitting: Gastroenterology

## 2023-11-24 NOTE — Telephone Encounter (Signed)
Telephone call to pt from ED Referral.    +home upt.  LMP 10/16/23- [redacted]w[redacted]d by LMP    11/23/2023- BHCG 78,469    Per ED note pt reported one small clot yesterday but no vaginal bleeding.    Pt states she would like to establish care at [facility].  She went to [facility] today and was given an usn.  She was told she has twins, she would like to continue her pregnancy.    Denies bleeding and pain.  States yesterday she passed one pea size clot with No bleeding.    Attempted to transfer pt to scheduling line to establish care, writer was on hold for 10 minutes.    Gave pt phone number for scheduling.    Reviewed bleeding warning signs and symptoms/when to call the doctor.  Reviewed will send to provider to make sure they are agreeable with plan.  If provider advises otherwise,  office will contact pt.    Pt verbalized understanding to all.    No further questions/concerns at this time.

## 2023-11-24 NOTE — Telephone Encounter (Signed)
Synethia with [facility] called stating that patient's MRI was denied by [facility] she would like to know if there will be a P2P done. Patient has appointment 12/14

## 2023-11-25 ENCOUNTER — Other Ambulatory Visit
Admission: RE | Admit: 2023-11-25 | Discharge: 2023-11-25 | Disposition: A | Discharge status: Home / Self Care | Payer: MEDICAID | Source: Ambulatory Visit | Attending: Obstetrics and Gynecology | Admitting: Obstetrics and Gynecology

## 2023-11-25 ENCOUNTER — Other Ambulatory Visit: Payer: Self-pay | Admitting: Obstetrics and Gynecology

## 2023-11-25 ENCOUNTER — Encounter: Payer: Self-pay | Admitting: Gastroenterology

## 2023-11-25 DIAGNOSIS — Z113 Encounter for screening for infections with a predominantly sexual mode of transmission: Secondary | ICD-10-CM | POA: Insufficient documentation

## 2023-11-25 DIAGNOSIS — Z3490 Encounter for supervision of normal pregnancy, unspecified, unspecified trimester: Secondary | ICD-10-CM

## 2023-11-25 DIAGNOSIS — Z118 Encounter for screening for other infectious and parasitic diseases: Secondary | ICD-10-CM | POA: Insufficient documentation

## 2023-11-25 DIAGNOSIS — Z7251 High risk heterosexual behavior: Secondary | ICD-10-CM | POA: Insufficient documentation

## 2023-11-25 NOTE — Telephone Encounter (Signed)
For P2P call [phone] Order# [id number]

## 2023-11-26 LAB — TRICHOMONAS NAAT: Trichomonas NAAT: NEGATIVE

## 2023-11-26 LAB — N. GONORRHOEAE NAAT (PCR): N. gonorrhoeae NAAT (PCR): NEGATIVE

## 2023-11-26 LAB — CHLAMYDIA NAAT (PCR): Chlamydia NAAT (PCR): NEGATIVE

## 2023-11-27 NOTE — Telephone Encounter (Signed)
I called and received authorization number. Authorization# [id number] 11/26/23-12/25/2023

## 2023-11-28 ENCOUNTER — Other Ambulatory Visit: Payer: Self-pay

## 2023-12-10 ENCOUNTER — Other Ambulatory Visit: Payer: Self-pay | Admitting: Gastroenterology

## 2023-12-10 ENCOUNTER — Other Ambulatory Visit
Admission: RE | Admit: 2023-12-10 | Discharge: 2023-12-10 | Disposition: A | Discharge status: Home / Self Care | Payer: MEDICAID | Source: Ambulatory Visit | Attending: Internal Medicine | Admitting: Internal Medicine

## 2023-12-10 DIAGNOSIS — N760 Acute vaginitis: Secondary | ICD-10-CM | POA: Insufficient documentation

## 2023-12-11 ENCOUNTER — Ambulatory Visit: Payer: MEDICAID | Attending: Obstetrics and Gynecology

## 2023-12-11 DIAGNOSIS — Z3490 Encounter for supervision of normal pregnancy, unspecified, unspecified trimester: Secondary | ICD-10-CM | POA: Insufficient documentation

## 2023-12-11 DIAGNOSIS — Z3A01 Less than 8 weeks gestation of pregnancy: Secondary | ICD-10-CM | POA: Insufficient documentation

## 2023-12-11 DIAGNOSIS — Z3A08 8 weeks gestation of pregnancy: Secondary | ICD-10-CM

## 2023-12-11 DIAGNOSIS — O99211 Obesity complicating pregnancy, first trimester: Secondary | ICD-10-CM | POA: Insufficient documentation

## 2023-12-11 DIAGNOSIS — O34211 Maternal care for low transverse scar from previous cesarean delivery: Secondary | ICD-10-CM | POA: Insufficient documentation

## 2023-12-11 DIAGNOSIS — D251 Intramural leiomyoma of uterus: Secondary | ICD-10-CM | POA: Insufficient documentation

## 2023-12-11 DIAGNOSIS — O30041 Twin pregnancy, dichorionic/diamniotic, first trimester: Secondary | ICD-10-CM | POA: Insufficient documentation

## 2023-12-11 DIAGNOSIS — E669 Obesity, unspecified: Secondary | ICD-10-CM | POA: Insufficient documentation

## 2023-12-11 LAB — VAGINITIS SCREEN: DNA PROBE: Vaginitis Screen:DNA Probe: 0

## 2023-12-14 ENCOUNTER — Telehealth: Payer: Self-pay

## 2023-12-14 NOTE — Progress Notes (Deleted)
 Subjective:       Madison Rose is a 32 y.o. female here for confirmation of pregnancy visit.  Pregnancy ***. Based on LMP 10/16/23, EDD 07/20/24 based on dating ultrasound, making pregnancy 8 and 6/7 today. Current complaints: ***.       Gynecologic History  LMP: 10/16/23  Contraception: none    Obstetric History  OB History       Gravida   7    Para   1    Term        Preterm        AB   5    Living   1         SAB        IAB        Ectopic        Multiple        Live Births                     There is no problem list on file for this patient.    No past medical history on file.    No past surgical history on file.     Current Outpatient Medications:     acetaminophen (TYLENOL 8 HOUR) 650 mg CR tablet, Take 1 tablet by mouth every 6 to 8 hours., Disp: 20 tablet, Rfl: 0    nitrofurantoin mono/macro (MACROBID) 100 mg capsule, Take 1 capsule by mouth twice daily for 7 days, Disp: 14 capsule, Rfl: 0    clotrimazole (LOTRIMIN) 1 % vaginal cream, Place vaginally 1 applicatorful at bedtime for 7 nights, Disp: 45 g, Rfl: 0    metroNIDAZOLE (METROGEL-VAGINAL) 0.75 % vaginal gel, Insert 1 applicatorful at bedtime vaginally for 5 nights, Disp: 70 g, Rfl: 0    fluconazole (DIFLUCAN) 100 mg tablet, Take 1 tablet (100 mg total) by mouth every 3 days., Disp: 2 tablet, Rfl: 0    fluconazole (DIFLUCAN) 150 mg tablet, Take 1 tablet by mouth once, may repeat dose in 72 hours if symptoms continue, Disp: 2 tablet, Rfl: 0    norethindrone-ethinyl estradiol (LOESTRIN FE 1/20) 1-20 MG-MCG tablet, Take 1 tablet by mouth once daily., Disp: 84 tablet, Rfl: 2    prenatal multivitamin (PRENATAL VITAMINS) tablet, Take 1 tablet by mouth daily., Disp: 30 tablet, Rfl: 5    metroNIDAZOLE (METROGEL-VAGINAL) 0.75 % vaginal gel, Insert 1 applicatorful vaginally at bedtime for 5 days for BV, Disp: 70 g, Rfl: 0    Probiotic, Lactobacillus, CAPS, Take 1 tablet by mouth daily, Disp: 30 capsule, Rfl: 6    triamcinolone (KENALOG) 0.1 % ointment, Apply  topically 2 times daily for Skin Infection due to Candida Yeast to the following areas: irritated vulva., Disp: 30 g, Rfl: 0    naproxen (NAPROSYN) 250 mg tablet, Take 1 tablet by mouth 2 times daily with food or milk., Disp: 10 tablet, Rfl: 0    penicillin v potassium (VEETIDS) 500 mg tablet, Take 1 tablet by mouth 3 times daily for 7 days., Disp: 21 tablet, Rfl: 0    acetaminophen (TYLENOL) 500 mg tablet, Take 1 tablet by mouth every 6 hours as needed for pain., Disp: 28 tablet, Rfl: 0    clotrimazole (LOTRIMIN) 1 % vaginal cream, Place 1 applicatorful vaginally daily at bedtime for 7 days., Disp: 45 g, Rfl: 0    phenazopyridine (PYRIDIUM) 200 MG tablet, Take 1 tablet (200 mg total) by mouth 3 times daily after meals for 2 days, Disp: 6 tablet,  Rfl: 0    clotrimazole (LOTRIMIN) 1 % vaginal cream, Insert 1 applicatorful vaginally at bedtime for 7 days, Disp: 45 g, Rfl: 0    hydrocortisone 1 % cream, APPLY EXTERNALLY TO THE AFFECTED AREA 2 TIMES A DAY FOR 7 DAYS., Disp: 28 g, Rfl: 2    sulfamethoxazole-trimethoprim (BACTRIM DS,SEPTRA DS) 800-160 MG per tablet, Take 1 tablet by mouth every 12 hours, Disp: 6 tablet, Rfl: 0    loperamide (IMODIUM) 2 MG capsule, TAKE 1 CAPSULE BY MOUTH 4 TIMES A DAY AS NEEDED., Disp: 9 capsule, Rfl: 0    fluticasone (FLONASE) 50 MCG/ACT nasal spray, USE 2 SPRAYS IN EACH NOSTRIL ONCE DAILY., Disp: 16 g, Rfl: 1    levonorgestrel (MIRENA) 20 MCG/24HR IUD, , Disp: , Rfl: 0    acetaminophen-codeine (TYLENOL #3) 300-30 MG per tablet, TAKE 1 TO 2 TABLETS EVERY 6 HOURS AS NEEDED FOR PAIN., Disp: 30, Rfl: 0    levonorgestrel (PLAN B) 0.75 MG tablet, TAKE 2 TABLET ONCE PRN Unprotected sex ASAP but within 5 days of unprotected sex., Disp: 2, Rfl: 20    ibuprofen (ADVIL,MOTRIN) 800 MG tablet, TAKE 1 TABLET EVERY 6 TO 8 HOURS AS NEEDED., Disp: 60, Rfl: 1    Patient's medications, allergies, past medical, surgical, social and family histories were reviewed and updated as appropriate.    Review of  Systems  Pertinent items are noted in HPI.      Objective:      LMP 10/16/2023 (Exact Date)   GENERAL: 32 y.o. year y/o female in NAD.      Contains abnormal data BHCG, quant pregnancy  Order: [id number]   Status: Final result       Visible to patient: Yes (not seen)    0 Result Notes        Component  Ref Range & Units 3 wk ago 2 yr ago 5 yr ago   BHCG, QUANT PREGNANCY  0 - 1 mIU/mL 30,124 High  915 High  CM 21,007 High  CM   Comment: REFERENCE RANGE DOES NOT APPLY TO PREGNANT WOMEN  Post Menopause Reference Range: 0-7 mIU/mL   Resulting Agency [name]             Specimen Collected: 11/23/23 08:56 Last Resulted: 11/23/23 10:13         Reading Physician Reading Date Result Priority   Jenny Reichmann  [phone] 12/11/2023      Narrative    Coding  ======    Diagnoses                           O34.211: Maternal Care for Low Transverse Scar from Previous Cesarean Delivery (Previous C-section)                                             O30.041: Twin Pregnancy, Dichorionic/Diamniotic (Di/Di)                                             O99.211,E66.9: OB - Obesity Complicating Pregnancy  D25.1: Fibroid Of Uterus, Intramural                                             Z3A.01: Gestational age, < 8 weeks  Procedures                          [phone]: OB ultrasound, complete, < 14 weeks.                                             82956: Transvaginal ultrasound, pregnant uterus, real time with image documentation    History  ======    OB History                           Gravida 6. Para 1                                             [id number]    Maternal Assessment  =================    Physical Exam                     Height 175 cm, 5 ft 9 in. Weight 101 kg, 222 lb. BMI 32.78 kg/m    Maternal Structures  ===============    Uterus                                 Fibroid(s) Size 1.8 cm x 2.0 cm x 1.8 cm. Mean 1.87 cm. Vol 3.393 cm  Right Ovary                           Visualized                                             Corpus luteum: seen  Left Ovary                            Appears normal    Dating  ======                                             Date                                Details  Gest. age                      EDD  LMP                                  10/16/2023                                                                                                                         8 w + 0 d                       07/22/2024  U/S Pregnancy A                12/11/2023                       based upon CRL                                                                        8 w + 2 d                       07/20/2024  U/S Pregnancy B                                                      based upon CRL                                                                         8 w + 2 d                       07/20/2024  Assigned dating                  based on the LMP, selected on 12/11/2023  8 w + 0 d                       07/22/2024    Pregnancy  =========    Twin pregnancy. Number of embryos: 2. Dichorionic-diamniotic    Assessment  ==========    Gestational sac: Present. Location: Intrauterine  Yolk sac: Seen  Embryo: Present  Cardiac activity: Present  CRL                                                         17.6                    mm                         98%                             [place]  FHR                                                         166                     bpm    Assessment  ==========    Gestational sac: Present. Location: Intrauterine  Yolk sac: Seen  Embryo: Present  Cardiac activity: Present  CRL                                                         18.3                    mm                         >99%                           [place]  FHR                                                          173                     bpm    Impression  =========    Early twin intrauterine pregnancy. Cardiac activity is seen in both fetuses. Dichorionic-diamniotic twin pregnancy.        Exam Ended: 12/11/23 11:24 Last Resulted: 12/11/23 15:13       Assessment:      *** weeks gestation by LMP     Plan:     Pregnancy options reviewed. Patient plans to ***.  Reviewed early pregnancy warnings and comfort measures. Avoid teratogenic  items .   Approved medications in pregnancy reviewed.   OB US ordered for dating completed on 12/11/23,  Rx for prenatal vitamins provided. Nutrition and hydration reviewed.   Will schedule NPI/NOB appointment in 4 weeks with MD or resident or prn.

## 2023-12-14 NOTE — Telephone Encounter (Signed)
Attempted to reach pt as scheduled for NOB tomorrow, 12/15/23.     No NPI scheduled and USN on 12/11/23 showed DI.DI twins with EDD 07/22/24.     Plan for 12/15/23 appointment to be confirmation of pregnancy visit as pt was undecided on options.     Provider aware and agreeable with plan. Appt changed at this time.

## 2023-12-15 ENCOUNTER — Ambulatory Visit: Payer: BLUE CROSS/BLUE SHIELD

## 2023-12-16 HISTORY — PX: PR CESAREAN DELIVERY ONLY W/POSTPARTUM CARE: 59515

## 2023-12-16 NOTE — L&D Delivery Note (Signed)
 Delivery Summary Note  Patient: Madison Rose  Age: 33 y.o.  Date of Birth: 1990-12-21  [id number]  MRN: [id number]    Admission Summary  Date and time of admission: 07/05/2024 10:02 PM [facility]    Diagnosis     Pyelonephritis     Breech presentation, fetus 1 of multiple gestation     History of cesarean section complicating pregnancy     Pyelonephritis      Allergies:   Ak-mycin [erythromycin ], Elemental sulfur, Clindamycin, and Clindamycin/lincomycin  Weight:          OB History   Gravida Para Term Preterm AB Living   7 1 1  0 5 1   SAB IAB Ectopic Multiple Live Births   0 0 0 0 1   Obstetric Comments   12/23/23: No carrier screening results found.   Pt states she is a carrier of sickle cell trait. States the FOB is not.    G7 P1051 is correct, does not wish to add dates of prior TABs/SABs             Prenatal Labs  ABO RH Blood Type   Date Value Ref Range Status   07/06/2024 O RH POS  Final     Antibody Screen   Date Value Ref Range Status   07/06/2024 Negative  Final     Rubella IgG AB   Date Value Ref Range Status   02/29/2024 0.8  Final     Comment:     This is a qualitative test.  The Index value (AI) is for  reference only and is not indicative of the amount of antibody  present.  AI values above the manufacturer recommended cutoff  for assay indicate that specific antibodies were detected,  suggesting prior exposure or vaccination.        <= 0.7 Negative        0.8, 0.9 Equivocal        >= 1.0 Positive  TEST METHOD: Multiplex flow immunoassay       HBV Core Ab   Date Value Ref Range Status   08/28/2023 NEG NEG Final     Comment:     Test Method: CMIA     HBV S Ab   Date Value Ref Range Status   08/28/2023 NEG  Final     Comment:     Test Method: CMIA     HBV S Ab Quant   Date Value Ref Range Status   08/28/2023 1.23 mIU/mL Final     Comment:     Immunity to HBV is implied by anti-HBs values greater than  or equal to 12 mIU/mL. This result may  represent the antibody  response to successful HBV immunization, to past HBV  infection or passively acquired antibody (e.g.tranfusion).  Post-immunization antibody testing guidelines for the general  public are described in MMWR 1990;39(S2):1-23 and for health-  care workers in MMWR 1997;46(No.RR-18).  Interpretive Criteria:  < 8.0 mIU/mL           Negative  8.0 to 12.0 mIU/mL     Indeterminate  12.0 to > 1000 mIU/mL  Positive  Test Method:CMIA       HBV S Ag   Date Value Ref Range Status   02/29/2024 NEG NEG Final     Comment:     Test Method: CMIA     Group B Strep Culture   Date Value Ref Range Status  05/11/2024 .  Final     HIV 1&2 ANTIGEN/ANTIBODY   Date Value Ref Range Status   02/29/2024 Nonreactive Nonreactive Final     Comment:     Test Method: CMIA      Dating Information  Patient's last menstrual period was 10/16/2023 (exact date). EDD: 07/22/2024, by Last Menstrual Period  This patient has no babies on file.      Delivery Blood Loss   Intrapartum & Postpartum: 07/06/24 1620 - 07/06/24 1802    Delivery Admission: 07/05/24 2202 - 07/06/24 1802         Intrapartum & Postpartum Delivery Admission    None             Madison Rose is a 33 y.o. now [id number] admitted at [redacted]w[redacted]d for IV antibiotics to treat presumed pyelonephritis. Pregnancy additionally complicated by di-di twins with 11.8% growth discordance, recurrent UTIs and pyelonephritis in pregnancy, elevated 1hr GTT and declination of further testing, Hx abdominoplasty, anemia, AMA, migraines, sickle cell trait, SMA carrier. Due to BPP 2/10 of Twin B, the decision was made to proceed with cesarean delivery. Consent was obtained. Ancef  was given for prophylaxis and {she received spinal anesthesia}{her epidural was bolused}{due to requirement for urgent delivery she received general anesthesia}. There was delivery of a viable female infant in the *** position, weighing *** grams, with APGARs of *** and ***. There was then delivery of a viable female infant  in the *** position, weighing *** grams, with APGARs of *** and ***.The infants were dried and stimulated on the maternal abdomen, the cords were clamped and cut, and the infant was passed to awaiting {NICU} personnel. For full details, please see operative note.  Mother and infant tolerated the delivery well.  EBL *** cc.    Ronita Sharps, MD  Obstetrics & Gynecology PGY-2  Pager [phone]

## 2023-12-17 ENCOUNTER — Telehealth: Payer: Self-pay

## 2023-12-17 NOTE — Telephone Encounter (Signed)
 Patient returned call, writer reviewed the patient's Chart & saw that she completed an USN 12.27.24. Writer clarified with the patient if she was looking to discuss options (per the original appt notes) or if she was looking for prenatal care. Patient confirmed she wanted to move forward with prenatal care & she has been scheduled as needed; 1.8.25 (Wed) @ 9:00am NPI & 1.23.25 (Thu) @ 9:00am In Office NOB w/NP Vella Gey.

## 2023-12-17 NOTE — Telephone Encounter (Signed)
 LVM to reschedule NPV/FUV for 12/15/23.

## 2023-12-21 ENCOUNTER — Other Ambulatory Visit: Payer: Self-pay

## 2023-12-21 ENCOUNTER — Emergency Department
Admission: EM | Admit: 2023-12-21 | Discharge: 2023-12-21 | Disposition: A | Discharge status: Home / Self Care | Payer: MEDICAID | Source: Ambulatory Visit | Attending: Emergency Medicine | Admitting: Emergency Medicine

## 2023-12-21 ENCOUNTER — Other Ambulatory Visit
Admission: RE | Admit: 2023-12-21 | Discharge: 2023-12-21 | Disposition: A | Discharge status: Home / Self Care | Payer: MEDICAID | Source: Ambulatory Visit | Attending: Physician Assistant | Admitting: Physician Assistant

## 2023-12-21 DIAGNOSIS — N898 Other specified noninflammatory disorders of vagina: Secondary | ICD-10-CM | POA: Insufficient documentation

## 2023-12-21 DIAGNOSIS — O4690 Antepartum hemorrhage, unspecified, unspecified trimester: Secondary | ICD-10-CM | POA: Insufficient documentation

## 2023-12-21 DIAGNOSIS — Z3A10 10 weeks gestation of pregnancy: Secondary | ICD-10-CM | POA: Insufficient documentation

## 2023-12-21 DIAGNOSIS — O209 Hemorrhage in early pregnancy, unspecified: Secondary | ICD-10-CM

## 2023-12-21 MED ORDER — SODIUM CHLORIDE 0.9 % FLUSH FOR PUMPS *I*
0.0000 mL/h | INTRAVENOUS | Status: DC | PRN
Start: 2023-12-21 — End: 2023-12-21

## 2023-12-21 MED ORDER — DEXTROSE 5 % FLUSH FOR PUMPS *I*
0.0000 mL/h | INTRAVENOUS | Status: DC | PRN
Start: 2023-12-21 — End: 2023-12-21

## 2023-12-21 NOTE — ED Triage Notes (Signed)
Pt states she's [redacted] weeks pregnant and when she was sleeping she had clear discharge and passed a blood clot this morning. Denies any abdominal pain or cramping      Prehospital medications given: No

## 2023-12-21 NOTE — Discharge Instructions (Signed)
Your ultrasound today showed twin pregnancies.    There is the possibility you will go on to have a miscarriage.    Please do not have vaginal intercourse or place anything in the vagina until cleared by your OBGYN.  Do not do any heavy lifting.    Follow up with your OBGYN closely.    Return to the ED if you have any worsening severe abdominal pain, bleeding where you are soaking through multiple pads in a few hours, or if you have other concerns.

## 2023-12-21 NOTE — ED Provider Notes (Signed)
History     Chief Complaint   Patient presents with    Vaginal Discharge     33yo F [redacted]wks pregnant (twins) presents with vaginal discharge.  Started this morning.  Felt like she passed a small quarter sized clot (single).  Also felt like a gush of clear fluid that came out prior to that.  No longer having any bleeding.  No pelvic or abdominal pain.  No nausea/vomiting.  No dysuria, hematuria.             Medical/Surgical/Family History     No past medical history on file.     There is no problem list on file for this patient.           No past surgical history on file.                  Review of Systems   All other systems reviewed and are negative.      Physical Exam     Triage Vitals  Triage Start: Start, (12/21/23 8341)  First Recorded BP: 135/89, Resp: 16, Temp: 36.6 C (97.9 F), Temp src: TEMPORAL Oxygen Therapy SpO2: 99 %, O2 Device: None (Room air), Heart Rate: 100, (12/21/23 0606)  .  First Pain Reported  0-10 Scale: 0, (12/21/23 9622)       Physical Exam  Vitals and nursing note reviewed.   Constitutional:       Appearance: Normal appearance.   HENT:      Head: Normocephalic and atraumatic.   Eyes:      Extraocular Movements: Extraocular movements intact.      Conjunctiva/sclera: Conjunctivae normal.   Pulmonary:      Effort: Pulmonary effort is normal. No respiratory distress.   Abdominal:      Tenderness: There is no abdominal tenderness. There is no guarding or rebound.   Musculoskeletal:         General: No deformity. Normal range of motion.      Cervical back: Normal range of motion and neck supple.   Skin:     General: Skin is warm and dry.   Neurological:      Mental Status: She is alert.   Psychiatric:         Mood and Affect: Mood normal.         Behavior: Behavior normal.         Medical Decision Making     Assessment:  Patient presenting with vaginal bleeding that is since resolved.  Is pregnant.    Differential diagnosis:  Threatened miscarriage, inevitable miscarriage, blood loss anemia,  low suspicion for ectopic pregnancy    Review of existing & external labs / records: Reviewed prior ultrasound done 12/11/2023-shows twin intrauterine pregnancy    ED Course and Disposition:  Prior labs reviewed - blood type is O+ so does not need rhogam.     My ultrasound at bedside here appears to show twin pregnancy with good fetal heart rate for each.  I discussed with her these findings.  I do not think that labs would be helpful right now as her bleeding has completely resolved and she is asymptomatic at this point.  I discussed the possibility of miscarriage.  Recommended nothing per vagina, no heavy lifting, close follow-up with OB/GYN, return precautions given.           Reina Fuse, MD            Reina Fuse, MD  12/21/23 1340

## 2023-12-22 ENCOUNTER — Other Ambulatory Visit: Payer: Self-pay

## 2023-12-22 DIAGNOSIS — N898 Other specified noninflammatory disorders of vagina: Secondary | ICD-10-CM

## 2023-12-22 LAB — VAGINITIS SCREEN: DNA PROBE: Vaginitis Screen:DNA Probe: 0

## 2023-12-22 MED ORDER — CLOTRIMAZOLE 1 % VA CREA *I*
1.0000 | TOPICAL_CREAM | Freq: Every evening | VAGINAL | 0 refills | Status: AC
Start: 2023-12-21 — End: 2023-12-29
  Filled 2023-12-22: qty 45, 7d supply, fill #0

## 2023-12-22 MED ORDER — METRONIDAZOLE 500 MG PO TABS *I*
500.0000 mg | ORAL_TABLET | Freq: Two times a day (BID) | ORAL | 0 refills | Status: DC
Start: 2023-12-21 — End: 2023-12-28
  Filled 2023-12-22: qty 14, 7d supply, fill #0

## 2023-12-23 ENCOUNTER — Other Ambulatory Visit: Payer: Self-pay

## 2023-12-23 ENCOUNTER — Telehealth: Payer: Self-pay

## 2023-12-23 ENCOUNTER — Ambulatory Visit: Payer: MEDICAID

## 2023-12-23 VITALS — BP 132/63 | HR 87 | Ht 70.0 in | Wt 231.4 lb

## 2023-12-23 DIAGNOSIS — O30041 Twin pregnancy, dichorionic/diamniotic, first trimester: Secondary | ICD-10-CM | POA: Insufficient documentation

## 2023-12-23 DIAGNOSIS — N760 Acute vaginitis: Secondary | ICD-10-CM

## 2023-12-23 DIAGNOSIS — G43909 Migraine, unspecified, not intractable, without status migrainosus: Secondary | ICD-10-CM

## 2023-12-23 DIAGNOSIS — O0991 Supervision of high risk pregnancy, unspecified, first trimester: Secondary | ICD-10-CM | POA: Insufficient documentation

## 2023-12-23 DIAGNOSIS — Z5941 Food insecurity: Secondary | ICD-10-CM

## 2023-12-23 DIAGNOSIS — Z23 Encounter for immunization: Secondary | ICD-10-CM | POA: Insufficient documentation

## 2023-12-23 LAB — VAGINITIS SCREEN: DNA PROBE: Vaginitis Screen:DNA Probe: 0

## 2023-12-23 MED ORDER — METOCLOPRAMIDE HCL 10 MG PO TABS *I*
10.0000 mg | ORAL_TABLET | Freq: Four times a day (QID) | ORAL | 0 refills | Status: AC
Start: 2023-12-23 — End: 2024-01-22
  Filled 2023-12-23: qty 120, 30d supply, fill #0

## 2023-12-23 NOTE — Progress Notes (Signed)
Telephone prenatal intake/history completed today:          Check Boxes:   [x]  Medical history and OB history up-to-date   [x]  Medications/allergies/immunizations reconciled from all sources   [x]  Prenatal checklist updated as appropriate   [x]  Reviewed multigender OB team which may include a female provider for delivery or anesthesia    USPSTF Aspirin 162 mg daily Prophylaxis Recommendations:   Recommended if ONE or more of HIGH Risk Factors:   [] History of preeclampsia, especially if associated with adverse outcomes   [x] Multifetal gestation   [] Chronic hypertension   [] Diabetes (type 1 or 2)   [] Renal disease   [] Autoimmune disease (SLE, APLS)   Recommended TWO or more MODERATE Risk Factors:   [] Nulliparity   [x] Obesity (BMI > 30)   [] Family history of preeclampsia (mother or sister)   [x] Black Race (as a proxy for underlying racism)   [x] Low income (ie. Have or eligible for medicaid)   [x] AMA (age 23 or older at delivery)   [] Person history risk factors (eg. SGA, previous adverse pregnancy outcome, >65yr pregnancy interval)   [] IVF pregnancy   NOT recommended for LOW risk:   [x] Previous uncomplicated full term delivery     Aspirin prophylaxis planning - Based on the above chart, aspirin prophylaxis is indicated.     Education:  Invited to join [employer] with [facility]) [x] Yes [] No    Invited to [facility] Program [x] Yes [] No    Toxoplasmosis/pregnancy exposure education [x] Yes [] No    Routine schedule of care [x] Yes [] No    Practice contact info  [x] Yes [] No    Digital resources sent [x] Yes [] No        Dental home: [facility]    PCP Need PCP     Narrative:   Madison Rose is a 33 y.o. [id number], currently at [redacted]w[redacted]d who presents for telephone prenatal intake today.    Currently lives with her daughter and identifies 3 sisters and cousin as primary support.    Needs identified: Housing needs, currently has transportation. Would like to meet with SW ASAP.      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        Plan:  New OB scheduled: Scheduled  Referrals made to social work, genetics due to [employer], [facility], and [employer].

## 2023-12-23 NOTE — Telephone Encounter (Signed)
1st call attempt to schedule- lvm    NPV [facility]

## 2023-12-23 NOTE — Patient Instructions (Signed)
General Vulvar Care    Use your hands only to wash. No washcloths or loofahs. Pat dry after bathing    Use Dove fragrance free bar soap, Cetaphil, Basis or Vanicream soap.     Use soft white unscented toilet paper.    Stick to same brand of fragrance free laundry detergent. Avoid bleach, fabric softeners and dryer sheets.    Avoid daily pantiliners; if that is not possible, use unscented brands. Try not to wear pads at home.   Avoid ALWAYS brand pantiliners. Stayfree, Kotex or organic pads are recommended.     Avoid baby wipes/personal wipes, douches, perfumes, feminine hygiene products     Avoid ALL over the counter itch products (Vagisil, Lanocaine, Vagicaine, benzocaine etc)    Use all cotton underwear, not just cotton crotch. Avoid girdles and thongs. Try to keep underwear loose.     Avoid panty hose - buy knee or thigh highs.     If you have difficulty cleaning after a bowel movement, try using Cetaphil body wash or mineral oil after a bowel movement to help remove feces. DON'T RUB! Albolene moisturizing cleanser is a makeup remover that also can be helpful with cleaning after a bowel movement. It is available at [facility] and [facility].     Avoid sitting in a wet bathing suit or remaining in sweaty exercise clothes.    Vaseline is a wonderful moisturizer / barrier ointment to use after bathing.    For dry skin, CeraVe cream is a good choice.    For intercourse, fragrance free over the counter lubricants such as KY, Replens, Astroglide. Avoid lubricants that will 'heat on contact.' Don't use oil based lubricants with condoms or sex toys. A good oil based lubricant (NOT compatible with condoms or sex toys) is Uberlube.    Consider buying a plastic sitz bath from a pharmacy that can fit into your toilet and provide soothing comfort to the vulva, or use the tub. Plain warm water is best.     Keep a freezer gel pack in the refrigerator. When you feel irritated wrap the pack in a washcloth and put it against your vulva.  Cold plain yogurt on a maxipad is also soothing.     The V Book by Zollie Beckers is an excellent resource, and is available at [employer]Chronic[employer].si.        VAGINITIS                                                                                   Vaginitis is defined as conditions that cause either infection or inflammation of the  vagina. Vaginitis occurs when the vagina is not able to maintain a healthy balance of normal  bacteria.    Factors:   Stress   Medications   Hormones   Contraceptive preparations   Douches   Sex or change in sex partner   Sexually transmitted infections (STIs).    Causes:  Infections:   Bacterial Vaginosis (BV),   Yeast,   Trichomoniasis    Other:   Irritants (chemicals, creams, sprays or clothing)   Retained products (tampons, condoms)   Atrophic Vaginitis.    Normal vaginal discharge:  Some  vaginal discharge is common and normal for women of childbearing age. Cervical  glands produce a clear mucous secretion that drains downward, mixing with bacteria,  discarded vaginal cells, and Bartholin gland secretions at the opening of the vagina.  These substances may turn the mucus a whitish color. The discharge may also turn  yellowish when exposed to air. There are times throughout the menstrual cycle that the  cervical glands produce more mucus than others (estrogen dependant).    SYMPTOMS OF VAGINAL INFECTION:   Vaginal discharge   itching and burning   Change in color or smell   Pain (not a frequent symptom of vaginitis) = evaluation by health care   professional.    Reviewed 10/2011Common Discomforts:  FIRST TRIMESTER    Pregnancy is an exciting time in your life.  Even though you may have planned to have this baby, your body's changes may not be something you planned on.  Here are some of the more common discomforts that women have during pregnancy.  Talk to your health care provider (HCP) if these tips do not seem to help or if you have other concerns.    Nausea and Morning  Sickness  Nausea is the most common complaint of early pregnancy.  It is usually caused by hormonal changes.  Things that help with nausea include the following:  " Do not let your stomach get completely empty.  Eat small, frequent meals.   " Keep crackers or dry toast at your bedside and eat before getting up.  " Eat a small snack before going to bed at night.  " Do not drink large amounts of liquids at one time.  " Avoid foods that are greasy, spicy or have odors that bother you.  " Take your prenatal vitamin at night if taking it in the morning upsets your stomach.    Fatigue  Feeling tired is common in early pregnancy due to the increase needs of your body.  Your amount of sleep needs to increase by several hours.   It is important to take naps and go to bed earlier.  Even just putting your feet up for a few minutes every hour or so helps.    Heartburn  Heartburn is a burning feeling in your chest.  During early pregnancy, stomach acid can back up into your esophagus due to a change in your hormone levels.  Things you can do to help relieve heartburn are:  " Avoid eating large meals and greasy, spicy, fatty foods.  " Avoid chewing gum.  " Do not lie down, bend over or stoop for 2 hours after eating.   " Raise the head of your bed 6 inches. You can use a couple of blocks of wood to do this.   " Avoid tight clothing around your abdomen or waist.  " Check with your HCP about an antacid that is safe to use.     Constipation  Constipation (going too long without having a bowel movement) happens because your hormone levels slow down the normal activity of your gastrointestinal (GI) tract, especially your intestines.  Things that help include:  " Increasing the "roughage" or fiber that you eat.  Eat more fruit, vegetables, dried fruits and bran or whole grain foods.  " Drinking more fluids-at least eight tall glasses of liquids each day.  Water is the best, but any liquids will help.  " Increasing your activity. Even  taking an extra 15 to 30 minute walk each day  can help.  " Maintaining regular bowel habits.  Try to have a bowel movement at the same time every day, but do not strain (push really hard) to get it out.  Sometimes a hot drink helps.  " Checking with your HCP about using a stool softener:  these medications make your bowel movement easier to get out.   Do no use laxative or enemas unless your HCP has ordered a special kind for you,    Frequent Urination  During early pregnancy you will notice that you have to go to the bathroom to urinate more often.  This is due to hormones in your body and to the growing uterus pushing on your bladder.  This frequency is normal but tell your HCP if you notice any burning or pain when you urinate.     Breast Tenderness  Your breasts get larger during pregnancy as the milk glands develop.  Sometime you feel a tingling or throbbing sensation.  These changes are normal.   If you wear a bra, make sure it supports you without being too tight.     Vaginal Discharge  The change in your hormone levels makes a change in the cells in your vagina.   This causes an increase in vaginal secretions.  The discharge  is usually thin and clear or white in color.  It should not have any odor.  Things you can do to help yourself feel more comfortable include the following:  " Keep your vulva (private area) clean and dry.  " Do not wear tight clothing or too many layers of clothing.   " Wear cotton underwear  " Do not douche or use feminine hygiene products or strong soaps.  " Report irritation, odor or a colored discharge to your HCP.    Warning Signs  Most women do not have problems during their pregnancy; however, sometimes a problem can occur.  Be sure to call your health care provider (HCP) right away so that any possible treatment can be started.  Warning signs in the first trimester include:  " Leakage of fluid from the vagina  " Bleeding from the vagina, with or without passing any blood clots  or tissue.  " Fever of more than 100.4 degrees F or 38.0 degrees C  " Abdominal or pelvic (area between your hips) pain   " Severe back pain that does not go away with rest  " Nausea and vomiting that lasts more than 1 day.  " Tea-colored urine or not urinating often enough  " Severe headache  " Fainting or blackouts    If you have any of these symptoms, call our office right away.     Reviewed 09/2010

## 2023-12-23 NOTE — Telephone Encounter (Signed)
Picked up/delivered

## 2023-12-23 NOTE — Progress Notes (Addendum)
Acute OB visit    Prenatal Visit:      Madison Rose is a  33 y.o., [id number] presenting for acute OBC at [redacted]w[redacted]d.     The patient has concerns for BV.  She reports white-brown vaginal discharge for the past 3 days.  She was seen at [name] on 12/21/23 and had a negative vaginitis swab at that time. Believes she has a BV infection. Denies any vaginal odor, itch, abdominal pain, or increased bleeding.   Was prescribed clotrimazole vaginal cream and has not used it yet as she was concerned it was not safe to use in pregnancy.   Reports she is not wearing a pad for the brown discharge.   No concerns for STD today    Has concerns for migraine headaches. Reports this is new onset.  Reports rest relieves the headaches.     Wonders if we can listen to the [facility] today.     Desires prenatal consult to [facility].       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    May be eligible for HPV vaccination PPM Z23       Problem list, medical, family & social history, medications, and allergies were reviewed and updated as appropriate today.    Vitals:  BP: 132/63    Height: 177.8 cm (5\' 10" )  Weight: 105 kg (231 lb 6.4 oz)    Total Weight Gain: 4.264 kg (9 lb 6.4 oz)                  Urine POCT:          Exam:           General appearance: alert, cooperative, and no distress  Abdomen: soft, non-tender; bowel sounds normal; no masses,  no organomegaly  Pelvic: cervix normal in appearance, external genitalia normal, no adnexal masses or tenderness, no cervical motion tenderness, pregnancy positive findings: uterine enlargement: 9 wk size, gravid, rectovaginal septum normal, and vagina normal without discharge, cervix is closed.    Utilizing a shared decision model, a pelvic exam was performed today as indicated by screening recommendations, medical history and/or symptoms.Christie Nottingham was present during  the pelvic exam today.     Plan:  [facility] social work in room to assess patient today and provide assistance with food insecurity, financial and housing concerns.     1. Dichorionic diamniotic twin pregnancy in first trimester (Primary)  - Common discomforts of the first trimester and first trimester warning signs reviewed with patient today.  Patient to notify [facility] with any first trimester warning signs or concerns.   Reviewed how to contact [facility] with any concerns.       2. Acute vaginitis    - Vaginitis screen: DNA probe (VAG); Future  - Vaginitis screen: DNA probe (VAG)  - Will notify pt of results via MyChart.  - Will treat any positive results.  - Vulvar hygiene and signs and symptoms of vaginitis reviewed with pt.    - Pt to notify [facility] with any worsening signs or symptoms of vaginitis.       3. Migraine    - metoclopramide (REGLAN) 10 mg tablet; Take 1 tablet (10 mg total) by mouth 4 times daily (before meals and nightly) for Migraine Headache.  Dispense: 120 tablet; Refill: 0  - Reviewed this medication  is safe to use in pregnancy.     - AMB REFERRAL TO [facility]UROLOGY - [place]   - Reviewed [name] Neurology team will contact patient to schedule an intake visit.     - Will place referral to [facility] in the second trimester. Pt agreeable to this.     - Advised pt that we cannot listen to FHR with doppler until babies are [redacted] weeks gestation. Pt states understanding of this.       Follow-up:   Return to the office in as scheduled on 01/07/24 or prn.        Honor Junes, NP

## 2023-12-23 NOTE — Progress Notes (Signed)
12/23/23 1205   Demographics   Primary Contact Phone Number Advita Halliwill [phone]   [place] Res[place]   Primary Residence Address [address] [place] [address]   Household Composition Pt reports that she and her 35yo daughter have been living with a friend for the past 5 months.   Insurance Information Medicaid   Service Location [place]   [facility]   [facility]GYN Provider [facility]   Risk Factors   Risk Factors Food issues;Housing issues;Complex medical issues/adjustment to diagnosi   Referrals & Resources Provided   Referrals & Resources Provided Food resources;Housing resources   Time Spent   Time Spent with Patient (min) 25     Lashanae Turlington, age 67, is currently [redacted]w[redacted]d pregnant with didi twins.  Received message that Ms. Finefrock was requesting to meet with SW re: food and housing resources.      Pt reports that she became homeless several months ago, she and her 35yo daughter have been staying with a friend and that she likely remain at this home during her pregnancy.  Ms. Cannizzo has applied to several housing complex's and is on waiting lists.  SW reviewed the [employer]- located affordable housing list for her to review.      Ms. Hudlow expressed interest in utilizing the [facility] food pantry today.  Pt has applied for SNAP in the past, stated that she was denied as she made $20 over income eligibility level.  Ms. Waugaman will consider applying again now that she is pregnant.     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      Plan:  Provided 2 bags of food from the [facility] food pantry            Affordable housing apt reviewed- most likely have waiting lists.            Pt expressed interest in linkage to a perinatal support program- she is aware that SW will meet with her at a future OBC to discuss options.  NP Asher Muir updated.  Loletta Specter, [place]  [id number] [phone]

## 2023-12-24 NOTE — Telephone Encounter (Signed)
Left vm, sent mc    Schedule in first available [facility] slot

## 2023-12-29 ENCOUNTER — Other Ambulatory Visit: Payer: Self-pay

## 2023-12-30 ENCOUNTER — Other Ambulatory Visit: Payer: Self-pay

## 2024-01-01 NOTE — Progress Notes (Signed)
 01/01/24 1335   Demographics   County [place] Residence [place]   Service Location [facility]   [facility]   Referrals & Resources Provided   Referrals & Resources Provided Other (comment)  (paternity testing informaiton)   Time Spent   Time Spent with Patient (min) 10     Writer received a VMM from Pt, returned call. Pt was requesting  information on paternity testing. Writer discussed the DHS process. Pt is looking to get testing done now, so Clinical research associate provided the phone numbers for private companies that charge a fee for testing, per her request.      Writer reminded Pt that she can request SW at any visit, and Clinical research associate also provided the SW office phone #, per her request.    SW remains available and will continue to follow pt.    Samule Ohm, LMSW [phone]

## 2024-01-05 ENCOUNTER — Other Ambulatory Visit: Payer: Self-pay

## 2024-01-07 ENCOUNTER — Ambulatory Visit: Payer: BLUE CROSS/BLUE SHIELD

## 2024-01-07 NOTE — Progress Notes (Deleted)
 [facility], Obstetrics and Gynecology    Chief Complaint:   Desire for Prenatal Care    Subjective:    Madison Rose is a 33 y.o. [phone] with LMP of 10/16/2023  who presents today for her initial prenatal visit.  By  LMP c/w ultrasound she is at [redacted]w[redacted]d today. She is pregnant with Di-Di Twins.     Since her LMP she has experienced {Pregnancy Symptoms:[phone]}. She denies {Pregnancy Symptoms:[phone]}.     This is her  second ongoing prengancy.  Prior pregnancy complications LTCS delivery in 2009. Pt is pregnancy with Di-Di twins.     Other issues today: {symptoms:30830}.    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    May be eligible for HPV vaccination PPM Z23       History     Past Medical History:   Diagnosis Date    Anemia     History of blood transfusion     per patient with delivery of her daughter in 2009    Migraines     Sickle cell anemia     Sickle cell trait per patient     Social History     Tobacco Use    Smoking status: Never     Passive exposure: Never    Smokeless tobacco: Not on file   Substance Use Topics    Alcohol use: Never       Past Surgical History:   Procedure Laterality Date    CESAREAN SECTION, UNSPECIFIED  10/20/2008    COSMETIC SURGERY  2021    abdominoplasty- [place]     Gynecologic History  Menstrual Status: Premenopausal      Pap History: Denies history of abnormal    HPV Vaccine Series: s/p 1 shot      Family History   Problem Relation Age of Onset    Asthma Mother     Diabetes Mother     High Blood Pressure Mother     Sickle cell trait Father     Sickle cell trait Sister      Asthma Daughter      OB History       Gravida   7    Para   1    Term   1    Preterm   0    AB   5    Living   1         SAB   0    IAB   0    Ectopic   0    Multiple   0    Live Births   1           Obstetric Comments   12/23/23: No carrier screening results found.  Pt states she is a carrier of sickle cell trait. States  the FOB is not.   G7 P1051 is correct, does not wish to add dates of prior TABs/SABs              Current Outpatient Medications   Medication Sig    folic acid (FOLVITE) 400 mcg tablet Take 2 tablets (800 mcg total) by mouth daily.    Prenatal Vit-Fe Fumarate-FA (PRENATAL PO) Take by mouth.    metoclopramide (REGLAN) 10 mg tablet Take 1 tablet (10 mg total) by mouth 4 times daily (before meals and nightly) for Migraine Headache.    acetaminophen (TYLENOL) 500 mg tablet Take 1 tablet by mouth every 6 hours as needed for pain.    Allergies   Allergen Reactions    Ak-Mycin [Erythromycin] Swelling     All "mycins"- throat swelling and hard to breath    Elemental Sulfur Hives    Clindamycin Other (See Comments)     unknown    Clindamycin/Lincomycin Other (See Comments) and Hives     unknown         Objective:   There were no vitals taken for this visit.  There were no vitals taken for this visit.    General appearance: {general exam:16600}  Neck: {neck exam:17463::"no adenopathy","no carotid bruit","no JVD","supple, symmetrical, trachea midline","thyroid not enlarged, symmetric, no tenderness/mass/nodules"}  Lungs: {lung exam:16931::"clear to auscultation bilaterally"}  Breasts: {breast exam:13139::"normal appearance, no masses or tenderness"}  Heart: {heart exam:5510::"regular rate and rhythm, S1, S2 normal, no murmur, click, rub or gallop"}  Abdomen: {abdominal exam:16834::"soft, non-tender; bowel sounds normal; no masses,  no organomegaly"}  Pelvic: {pelvic exam:16852::"cervix normal in appearance","external genitalia normal","no adnexal masses or tenderness","no cervical motion  tenderness","rectovaginal septum normal","uterus normal size, shape, and consistency","vagina normal without discharge"}  Extremities: {extremity exam:5109::"extremities normal, atraumatic, no cyanosis or edema"}     Utilizing a shared decision model, a pelvic exam was performed today as indicated by screening recommendations, medical history and/or symptoms.    *** was present during the breast and pelvic exams today.     Review Flowsheet           No data to display              Her anxiety is        USPSTF Aspirin 162 mg daily Prophylaxis Recommendations:   Recommended if ONE or more of HIGH Risk Factors:   [] History of preeclampsia, especially if associated with adverse outcomes   [x] Multifetal gestation   [] Chronic hypertension   [] Diabetes (type 1 or 2)   [] Renal disease   [] Autoimmune disease (SLE, APLS)   Recommended TWO or more MODERATE Risk Factors:   [] Nulliparity   [x] Obesity (BMI > 30)   [] Family history of preeclampsia (mother or sister)   [x] Black Race (as a proxy for underlying racism)   [x] Low income (ie. Have or eligible for medicaid)   [x] AMA (age 47 or older at delivery)   [] Person history risk factors (eg. SGA, previous adverse pregnancy outcome, >22yr pregnancy interval)   [] IVF pregnancy   NOT recommended for LOW risk:   [x] Previous uncomplicated full term delivery        Assessment:     33 y.o. Z6X0960 at [redacted]w[redacted]d for New OB Visit    Plan:      Pregnancy Risks:  LTCS delivery in 2009.  Patient will be age 33/ AMA at delivery.  History of sickle cell anemia  History of migraines       - New patient prenatal packet given and reviewed.  -  Patient instructed to take/continue prenatal vitamins  - RX for Aspirin 162 mg po provided to patient today to take daily.  Rationale and safety for Aspirin use in pregnancy reviewed with patient today.   [facility] and practice style discussed including cross-coverage system.  - Nutrition, exercise, fish and cheese advisories reviewed. Patient referred to  literature in prenatal packet.  - Patient instructed to avoid changing cat litter if possible and to wear gloves when gardening and during contact with soil/sand.  - Current BMI There is no height or weight on file to calculate BMI.. Patient is {BMI weight ZOXW:9604540981}  - Reviewed options for aneuploidy screening in pregnancy including 1st trimester screen, 2nd trimester screen and non-invasive cell free DNA testing. Limitations of serum and ultrasound screenings reviewed.   Pt will be age 66 at delivery, will place referral to [name] Reproductive Genetics Team to discuss testing options for  AMA pregnant patients.   - Anatomic ultrasound ordered to be completed at [redacted] weeks gestation.   - Discussed HIV testing. Patient {does/does not:19886} consent to testing at this time.   - Lab work ordered: {Intake XBJY:7829562130}  - Cystic fibrosis screen {CF screen:[phone]}  - Spinal muscle atrophy screen {SMA QMVHQI:696295284}  - Hemoglobin electrophoresis screen {HgE screen:[phone]}  - Reviewed screening pap smear guidelines  - Last pap smear was 09/23/21 with no abnormal , per current screening guidelines, patient is not due for a pap smear today.   - Common discomforts of the first trimester and first trimester warning signs reviewed with patient today.  Patient to notify [facility] with any first trimester warning signs or concerns.       - Patient to return to the office in 4 weeks for OBC, in 9 weeks for anatomic ultrasound, or prn.

## 2024-01-08 ENCOUNTER — Telehealth: Payer: Self-pay

## 2024-01-08 NOTE — Telephone Encounter (Signed)
Writer called and left voicemail offering to reschedule NOB no showed appt.  A Mychart message will be sent to the patient.

## 2024-01-10 ENCOUNTER — Other Ambulatory Visit (HOSPITAL_COMMUNITY): Payer: Self-pay | Admitting: Cardiology

## 2024-01-11 NOTE — Telephone Encounter (Signed)
Writer left vm X 1. If patient returns call please assist patient with rescheduling missed NOB appt.

## 2024-01-12 NOTE — Telephone Encounter (Signed)
 Writer left vm X 2. If patient returns call please assist patient with rescheduling missed NOB.

## 2024-01-14 NOTE — Telephone Encounter (Signed)
 Writer called and spoke with patient. Appt scheduled.

## 2024-01-15 ENCOUNTER — Other Ambulatory Visit (HOSPITAL_COMMUNITY): Payer: Self-pay

## 2024-01-15 MED ORDER — ENTRESTO 97-103 MG PO TABS: 1.0000 | ORAL_TABLET | Freq: Two times a day (BID) | ORAL | 3 refills | Status: AC

## 2024-01-21 ENCOUNTER — Other Ambulatory Visit: Payer: Self-pay

## 2024-01-21 NOTE — Progress Notes (Signed)
[facility] -  Patient Outreach     Outreach Type:  Telephone call    Reason for Outreach:  CoCM referral    Session Content:  Clinical research associate called and spoke to [place]. Writer introduced herself and explained CoCM, Kemiyah reports she was at work and unable to talk. Writer and Evangela planned to have initial assessment tomorrow 2/7 in the afternoon    Time Spent:   Time spent with patient (Minutes): 10  Time Spent Performing Chart Review (Minutes): 5  Total Time (Minutes): 15    Arva Chafe

## 2024-01-22 ENCOUNTER — Other Ambulatory Visit: Payer: Self-pay

## 2024-01-22 NOTE — Progress Notes (Signed)
[facility] -  Patient Outreach     Outreach Type:  Telephone call    Reason for Outreach:  CoCM outreach    Session Content:  Clinical research associate called and Data processing manager for [employer].     Time Spent:   Time spent with patient (Minutes): 0  Time Spent Performing Chart Review (Minutes): 5  Total Time (Minutes): 5    Arva Chafe

## 2024-01-22 NOTE — ED Triage Notes (Signed)
Pt is [redacted] weeks pregnant with twins, slipped and fell on ice tonight, has had spotting and would like to be seen. Pt going straight to OB    Prehospital medications given: No

## 2024-01-23 ENCOUNTER — Observation Stay
Admission: EM | Admit: 2024-01-23 | Discharge: 2024-01-23 | Disposition: A | Discharge status: Home / Self Care | Payer: MEDICAID | Source: Ambulatory Visit | Attending: Obstetrics & Gynecology | Admitting: Obstetrics & Gynecology

## 2024-01-23 DIAGNOSIS — O30042 Twin pregnancy, dichorionic/diamniotic, second trimester: Secondary | ICD-10-CM | POA: Insufficient documentation

## 2024-01-23 DIAGNOSIS — Y9389 Activity, other specified: Secondary | ICD-10-CM | POA: Insufficient documentation

## 2024-01-23 DIAGNOSIS — O9A212 Injury, poisoning and certain other consequences of external causes complicating pregnancy, second trimester: Principal | ICD-10-CM | POA: Insufficient documentation

## 2024-01-23 DIAGNOSIS — W009XXA Unspecified fall due to ice and snow, initial encounter: Secondary | ICD-10-CM | POA: Insufficient documentation

## 2024-01-23 DIAGNOSIS — Y9289 Other specified places as the place of occurrence of the external cause: Secondary | ICD-10-CM | POA: Insufficient documentation

## 2024-01-23 DIAGNOSIS — Z3A14 14 weeks gestation of pregnancy: Secondary | ICD-10-CM | POA: Insufficient documentation

## 2024-01-23 DIAGNOSIS — W19XXXA Unspecified fall, initial encounter: Secondary | ICD-10-CM | POA: Insufficient documentation

## 2024-01-23 DIAGNOSIS — Y998 Other external cause status: Secondary | ICD-10-CM | POA: Insufficient documentation

## 2024-01-23 DIAGNOSIS — N898 Other specified noninflammatory disorders of vagina: Secondary | ICD-10-CM | POA: Insufficient documentation

## 2024-01-23 NOTE — OB Triage Note (Signed)
Pt into triage with c/o fall on ice resulting in pt landing on R side. Pt endorses +VB (spotting),  -LOF, ?FM, and cramping. AOx 3.     Vitals:    01/23/24 0008   BP: 117/61   Pulse: 104   Resp: 18   Temp: 36.5 C (97.7 F)     Dr. Mikey Kirschner notified and in to see pt.     Manual Meier, RN

## 2024-01-23 NOTE — Progress Notes (Signed)
Pt discharged from unit following consultation with Dr. Zenaida Niece Dis. AVS reviewed with pt and pt endorsed understanding. Pt discharged via ambulation dressed appropriately for the weather.    Manual Meier, RN

## 2024-01-23 NOTE — OB Triage Note (Signed)
OBSTETRICS EVALUATION     Primary OB-GYN: [facility]    Chief Complaint: Fall    HPI     Madison Rose is a 33 y.o. G2X5284 at [redacted]w[redacted]d with pregnancy complicated by risks outlined below who presents after a fall on the ice. She caught herself before landing on her side, which is a little sore. She did not hit her abdomen. She fell around 7 to 8 pm. She then felt a little cramping, but took some tylenol and it went away. She then noticed some light pink while wiping, which then turned brown. She denies any cramping since being in [name] triage.     She reports feeling fetal movement consistently, which hasn't changed since her fall.     Patient's medications, allergies, past medical, surgical, social and family histories were recorded, reviewed and updated as appropriate.     Pregnancy Risks     Didi twins    Hx of abdominoplasty    Hx of C/S    Sickle cell trait    Migraines    Obstetrical History     OB History   Gravida Para Term Preterm AB Living   7 1 1  0 5 1   SAB IAB Ectopic Multiple Live Births   0 0 0 0 1      # Outcome Date GA Lbr Len/2nd Weight Sex Type Anes PTL Lv   7 Current            6 Term 10/20/08 [redacted]w[redacted]d  3610 g (7 lb 15.3 oz) F *CSLT Spinal N LIV      Complications: Fetal Intolerance   5 AB      TAB      4 AB      SAB      3 AB      SAB      2 AB      TAB      1 AB      TAB         Obstetric Comments   12/23/23: No carrier screening results found.   Pt states she is a carrier of sickle cell trait. States the FOB is not.    G7 P1051 is correct, does not wish to add dates of prior TABs/SABs         Prenatal Labs     No results for input(s): "WBC", "HGB", "HCT", "PLT" in the last 168 hours.  No results for input(s): "NA", "K", "CL", "CO2", "UN", "CREAT", "GFRC", "GLU" in the last 168 hours. No results for input(s): "LD", "URIC", "ALT", "AST", "ALK", "TB" in the last 168 hours.   No results for input(s): "UTPR", "UCRR" in the last 168 hours.    Urine spot P/C ratio:  No results for input(s): "TPCREATRATIO" in the  last 8760 hours.          Lab results: 11/25/23  1020 09/30/23  1800 08/28/23  1557   Syphilis Screen  --   --  Neg   HIV 1&2 ANTIGEN/ANTIBODY  --   --  Nonreactive   HBV S Ag  --   --  NEG   N. gonorrhoeae NAAT (PCR) NEGATIVE   < >  --    Chlamydia NAAT (PCR) NEGATIVE   < >  --     < > = values in this interval not displayed.    No results for input(s): "1SCRN" in the last 8760 hours.   No results for input(s): "GL0", "GL1H", "GL2H", "GL3H" in  the last 8760 hours.        Physical Exam     Vitals:    01/23/24 0008   BP: 117/61   Pulse: 104   Resp: 18   Temp: 36.5 C (97.7 F)   TempSrc: Temporal   SpO2: 99%       General Appearance: healthy, alert, active, and no distress  Mental Status: Alert and oriented x 3  Mood/Affect: appropriate  Skin: color normal, vascularity normal, no evidence of bleeding or bruising, no lesions noted, and no edema  HEENT: Normocephalic, atraumatic.  Cardiovascular: normal rate  Respiratory: normal respiratory effort on room air  Abdomen: Soft, gravid, non-tender.     Uterus: Gravid.  Non-tender to palpation.  Neurological: able to move all extremities spontaneously and without difficulty  Extremities: normal    External Genitalia: Unremarkable external genitalia without lesions.  Normal appearing labia majora/minora and introitus.  Vagina: Pink, ruggated vaginal mucosa without lesions.  Physiologic, thin white discharge.  No bleeding.  Cervix: Closed on speculum exam.  No lesions.  No leakage of fluid.     Sterile Speculum Exam:  Pooling: negative  Ferning: negative  Nitrazine: negative  Membranes: intact    Wet Prep: , pH- normal, Whiff- absent, Clue cells- absent, Hyphae- absent, Trich- absent, White Blood Cells- few, Lactobacilli- few    Bedside ultrasound: active fetuses with normal FHR.     Baby A FHR: 153 bpm  Baby B FHR: 155 bpm      Assessment & Plan     Madison Rose is a 33 yo G14P1051 female at [redacted]w[redacted]d with didi twins who presents after a fall with pink discharge noted with  wiping. VSS and afebrile. Physical exam benign with no blood noted in vaginal vault to indicate active bleeding at this time. Cervix closed on speculum exam. Bedside ultrasound with FHR wnl and active fetuses seen. Wet prep negative. Patient feeling reassured and is stable for discharge. Next prenatal appointment on 02/08/24.    D/w Dr. Zenaida Niece Dis    Charlies Silvers, MD  Obstetrics & Gynecology PGY-2  Pager #: [phone]

## 2024-01-23 NOTE — Discharge Instructions (Signed)
Reason for Triage Visit: Fall  Destination: Home  Mode: Ambulatory    Procedures done in triage: Sterile speculum exam and Ultrasound  Pending Laboratory Data: none    Diet: Regular    Activity: Regular activity    Special Instructions: Call or return to triage if you have worsening of any of your symptoms, if you have more than 6 contractions in an hour, if you have vaginal bleeding or leakage of clear fluid or if you don't feel your babies moving like normal after trying your normal things (eating, drinking old water, moving your belly, etc)    An ultrasound was completed tonight and showed 2 active babies with normal heartbeats.       If you cannot reach your OB/Gyn or Midwife, call the Labor and Delivery Unit or 911 if it is an emergency.    Follow-up with your [facility] provider as previously instructed.

## 2024-02-07 NOTE — Progress Notes (Unsigned)
 [facility], Obstetrics and Gynecology     Chief Complaint:   Desire for Prenatal Care     Subjective:    Madison Rose is a 33 y.o. [phone] with LMP of 10/16/2023  who presents today for her initial prenatal visit.  By  LMP c/w ultrasound she is at [redacted]w[redacted]d today. She is pregnant with Di-Di Twins.     She is here with her daughter for the visit today.      Pt reports good fetal movement.  Patient denies any contractions, leaking of fluid, vaginal bleeding, abnormal vaginal discharge.       This is her  second ongoing prengancy.  Prior pregnancy complications LTCS delivery in 2009. Pt is pregnancy with Di-Di twins. She is AMA.          Genetic Screening  Genetic Screening/Teratology Counseling- Includes patient, baby's father, or anyone in either family with:  Sickle cell disease or trait --African--: Yes  States she is a carrier and that FOB is negative  Any other: Will meet repro genetics             Patient Active Problem List   Diagnosis Code    Dichorionic diamniotic twin pregnancy in first trimester O30.041    May be eligible for HPV vaccination PPM Z23                   History           Past Medical History:   Diagnosis Date    Anemia      History of blood transfusion       per patient with delivery of her daughter in 2009    Migraines      Sickle cell anemia       Sickle cell trait per patient     Social History            Tobacco Use    Smoking status: Never       Passive exposure: Never    Smokeless tobacco: Not on file   Substance Use Topics    Alcohol use: Never             Past Surgical History:   Procedure Laterality Date    CESAREAN SECTION, UNSPECIFIED   10/20/2008    COSMETIC SURGERY   2021     abdominoplasty- [place]     Gynecologic History  Menstrual Status: Premenopausal       Pap History: Denies history of abnormal    HPV Vaccine Series: s/p 1 shot            Family History   Problem Relation Age of Onset    Asthma Mother      Diabetes Mother      High Blood Pressure Mother       Sickle cell trait Father      Sickle cell trait Sister      Asthma Daughter       OB History         Gravida   7    Para   1    Term   1    Preterm   0    AB   5    Living   1           SAB   0    IAB   0    Ectopic   0    Multiple   0    Live Births  1             Obstetric Comments   12/23/23: No carrier screening results found.  Pt states she is a carrier of sickle cell trait. States the FOB is not.   G7 P1051 is correct, does not wish to add dates of prior TABs/SABs                     Current Outpatient Medications   Medication Sig    folic acid (FOLVITE) 400 mcg tablet Take 2 tablets (800 mcg total) by mouth daily.    Prenatal Vit-Fe Fumarate-FA (PRENATAL PO) Take by mouth.    metoclopramide (REGLAN) 10 mg tablet Take 1 tablet (10 mg total) by mouth 4 times daily (before meals and nightly) for Migraine Headache.    acetaminophen (TYLENOL) 500 mg tablet Take 1 tablet by mouth every 6 hours as needed for pain.    Allergies         Allergies   Allergen Reactions    Ak-Mycin [Erythromycin] Swelling       All "mycins"- throat swelling and hard to breath    Elemental Sulfur Hives    Clindamycin Other (See Comments)       unknown    Clindamycin/Lincomycin Other (See Comments) and Hives       unknown             Objective:     Vitals:    02/08/24 0946   BP: 131/73   Pulse: 103   Weight: 110.9 kg (244 lb 6.4 oz)   Height: 1.778 m (5\' 10" )        General appearance: alert, cooperative, and no distress  Neck: no adenopathy, no carotid bruit, no JVD, supple, symmetrical, trachea midline, and thyroid not enlarged, symmetric, no tenderness/mass/nodules  Lungs: clear to auscultation bilaterally  Breasts: normal appearance, no masses or tenderness  Heart: regular rate and rhythm, S1, S2 normal, no murmur, click, rub or gallop  Abdomen: soft, non-tender; bowel sounds normal; no masses,  no organomegaly  Pelvic: declines.   Extremities: extremities normal, atraumatic, no cyanosis or edema      Utilizing a shared decision model,  a pelvic exam was performed today as indicated by screening recommendations, medical history and/or symptoms.     L. Picarello was present during the breast exam today.     FHR A: 150  FHR B: 140     Review Flowsheet          02/08/2024   Dates   PHQ9 Q9 - Better Off Dead 0   PHQ9 Total Score 3    PHQ9 Severity Level None or Minimal       Details          Patient-reported             Her anxiety is minimal (0-4) based a GAD-7 Score: (Patient-Rptd) 1 and this has made it   for her to do her work, take care of things at home, or get along with other people.       USPSTF Aspirin 162 mg daily Prophylaxis Recommendations:   Recommended if ONE or more of HIGH Risk Factors:   [] History of preeclampsia, especially if associated with adverse outcomes   [x] Multifetal gestation   [] Chronic hypertension   [] Diabetes (type 1 or 2)   [] Renal disease   [] Autoimmune disease (SLE, APLS)   Recommended TWO or more MODERATE Risk Factors:   [] Nulliparity   [x] Obesity (BMI > 30)   []   Family history of preeclampsia (mother or sister)   [x] Black Race (as a proxy for underlying racism)   [x] Low income (ie. Have or eligible for medicaid)   [x] AMA (age 45 or older at delivery)   [] Person history risk factors (eg. SGA, previous adverse pregnancy outcome, >54yr pregnancy interval)   [] IVF pregnancy   NOT recommended for LOW risk:   [x] Previous uncomplicated full term delivery         Assessment:      33 y.o. [id number] at [redacted]w[redacted]d for New OB Visit     Plan:      Pregnancy Risks:  LTCS delivery in 2009.  Patient will be age 89/ AMA at delivery.  History of sickle cell anemia  History of migraines        - New patient prenatal packet given and reviewed.  - Patient instructed to take/continue prenatal vitamins  - RX for Aspirin 162 mg po provided to patient today to take daily.  Rationale and safety for Aspirin use in pregnancy reviewed with patient today.   [facility] and practice style discussed including cross-coverage system.  - Nutrition, exercise,  fish and cheese advisories reviewed. Patient referred to literature in prenatal packet.  - Patient instructed to avoid changing cat litter if possible and to wear gloves when gardening and during contact with soil/sand.  - Current BMI There is no height or weight on file to calculate BMI.. Patient is obese (BMI 30 or greater).  Recommended weight gain is 11-20lbs.  - Reviewed options for aneuploidy screening in pregnancy including 1st trimester screen, 2nd trimester screen and non-invasive cell free DNA testing. Limitations of serum and ultrasound screenings reviewed.   Pt will be age 84 at delivery, will place referral to [facility] Reproductive Genetics Team to discuss testing options for  AMA pregnant patients.   - Anatomic ultrasound ordered to be completed at [redacted] weeks gestation.   - Discussed HIV testing. Patient does consent to testing at this time.   - Lab work ordered: intake prenatal labs  and urine culture  - Cystic fibrosis screen has not previously been done. Testing discussed, consent form signed and test ordered.   - Spinal muscle atrophy screen has not previously been done. Testing discussed, consent form signed and test ordered  - Hemoglobin electrophoresis screen has not previously been done. Testing discussed, consent form signed and test ordered.   - Reviewed screening pap smear guidelines  - Last pap smear was 09/23/21 with no abnormal , per current screening guidelines, patient is not due for a pap smear today.   - [facility] Social Work in room to see patient today.   - [facility] in room to see patient today.   - Common discomforts of the first trimester and first trimester warning signs reviewed with patient today.  Patient to notify [facility] with any first trimester warning signs or concerns.       1. Dichorionic diamniotic twin pregnancy in second trimester (Primary)      2. Supervision of high risk pregnancy in second trimester    - Prenatal Type and Screen Only; Future  - Prenatal profile;  Future  - Lead, blood; Future  - Urine culture  - Hemoglobin electrophoresis; Future  - Spinal Muscular Atrophy Copy Number Analysis; Future  - Cystic Fibrosis Expanded Variant Panel; Future    3. Multigravida of advanced maternal age in second trimester    - 1 hour glucose tolerance test; Future  - Hemoglobin A1c; Future  -  Comprehensive metabolic panel; Future  - Protein:Creatinine ratio (clinic collect)    - AMB REFERRAL TO [facility] [place]  - [facility] Reproductive Genetics staff will contact pt to schedule intake appointment.     - aspirin 81 mg EC tablet; Take 2 tablets (162 mg total) by mouth daily for AMA, preeclampsia prevention.  Dispense: 360 tablet; Refill: 0      4. Screening examination for STD (sexually transmitted disease)    - Hepatitis C antibody; Future  - HIV-1/2  Antigen/Antibody Screen with Confirmation; Future  - Chlamydia NAAT (PCR); Future  - N. gonorrhoeae NAAT (PCR); Future  - Trichomonas NAAT (PCR); Future     - Patient to return to the office in 4 weeks for [facility] with MD or resident, as scheduled on 03/07/24 for anatomic ultrasound, or prn.     Honor Junes, NP

## 2024-02-08 ENCOUNTER — Other Ambulatory Visit: Payer: Self-pay

## 2024-02-08 ENCOUNTER — Ambulatory Visit: Payer: MEDICAID

## 2024-02-08 ENCOUNTER — Observation Stay
Admission: RE | Admit: 2024-02-08 | Discharge: 2024-02-08 | Disposition: A | Discharge status: Home / Self Care | Payer: MEDICAID | Source: Ambulatory Visit | Attending: Obstetrics and Gynecology | Admitting: Obstetrics and Gynecology

## 2024-02-08 VITALS — BP 131/73 | HR 103 | Ht 70.0 in | Wt 244.4 lb

## 2024-02-08 DIAGNOSIS — O09522 Supervision of elderly multigravida, second trimester: Secondary | ICD-10-CM | POA: Insufficient documentation

## 2024-02-08 DIAGNOSIS — R1030 Lower abdominal pain, unspecified: Secondary | ICD-10-CM | POA: Insufficient documentation

## 2024-02-08 DIAGNOSIS — Z59819 Housing instability, housed unspecified: Secondary | ICD-10-CM

## 2024-02-08 DIAGNOSIS — M545 Low back pain, unspecified: Secondary | ICD-10-CM | POA: Insufficient documentation

## 2024-02-08 DIAGNOSIS — Z113 Encounter for screening for infections with a predominantly sexual mode of transmission: Secondary | ICD-10-CM | POA: Insufficient documentation

## 2024-02-08 DIAGNOSIS — O26852 Spotting complicating pregnancy, second trimester: Principal | ICD-10-CM | POA: Insufficient documentation

## 2024-02-08 DIAGNOSIS — O0992 Supervision of high risk pregnancy, unspecified, second trimester: Secondary | ICD-10-CM | POA: Insufficient documentation

## 2024-02-08 DIAGNOSIS — O30042 Twin pregnancy, dichorionic/diamniotic, second trimester: Secondary | ICD-10-CM | POA: Insufficient documentation

## 2024-02-08 DIAGNOSIS — Z118 Encounter for screening for other infectious and parasitic diseases: Secondary | ICD-10-CM | POA: Insufficient documentation

## 2024-02-08 DIAGNOSIS — N898 Other specified noninflammatory disorders of vagina: Secondary | ICD-10-CM | POA: Insufficient documentation

## 2024-02-08 DIAGNOSIS — O26892 Other specified pregnancy related conditions, second trimester: Secondary | ICD-10-CM

## 2024-02-08 DIAGNOSIS — R82998 Other abnormal findings in urine: Secondary | ICD-10-CM | POA: Insufficient documentation

## 2024-02-08 DIAGNOSIS — Z3A16 16 weeks gestation of pregnancy: Secondary | ICD-10-CM | POA: Insufficient documentation

## 2024-02-08 DIAGNOSIS — O4692 Antepartum hemorrhage, unspecified, second trimester: Secondary | ICD-10-CM

## 2024-02-08 LAB — URINALYSIS WITH REFLEX TO MICROSCOPIC
Blood,UA: NEGATIVE
Glucose,UA: NEGATIVE
Ketones, UA: NEGATIVE
Nitrite,UA: NEGATIVE
Protein,UA: NEGATIVE
Specific Gravity,UA: 1.015 (ref 1.002–1.030)
pH,UA: 6.5 (ref 5.0–8.0)

## 2024-02-08 LAB — CHLAMYDIA NAAT (PCR)
Chlamydia NAAT (PCR): NEGATIVE
Chlamydia NAAT (PCR): NEGATIVE

## 2024-02-08 LAB — N. GONORRHOEAE NAAT (PCR)
N. gonorrhoeae NAAT (PCR): NEGATIVE
N. gonorrhoeae NAAT (PCR): NEGATIVE

## 2024-02-08 LAB — PROTEIN,UR + CREAT,UR WITH RATIO
Creatinine,UR: 149 mg/dL (ref 20–300)
Protein,UR: 10 mg/dL (ref 0–11)
TP Creatinine ratio,UR: 0.07

## 2024-02-08 LAB — TRICHOMONAS NAAT: Trichomonas NAAT: NEGATIVE

## 2024-02-08 LAB — URINE MICROSCOPIC (IQ200)

## 2024-02-08 LAB — VAGINITIS SCREEN: DNA PROBE: Vaginitis Screen:DNA Probe: POSITIVE — AB

## 2024-02-08 MED ORDER — ACETAMINOPHEN 500 MG PO TABS *I*
1000.0000 mg | ORAL_TABLET | Freq: Once | ORAL | Status: AC
Start: 2024-02-08 — End: 2024-02-08
  Administered 2024-02-08: 1000 mg via ORAL
  Filled 2024-02-08: qty 2

## 2024-02-08 MED ORDER — ASPIRIN 81 MG PO TBEC *I*
162.0000 mg | DELAYED_RELEASE_TABLET | Freq: Every day | ORAL | 5 refills | Status: DC
Start: 2024-02-08 — End: 2024-07-07
  Filled 2024-02-08: qty 60, 30d supply, fill #0
  Filled 2024-03-15: qty 60, 30d supply, fill #1
  Filled 2024-05-03 – 2024-05-17 (×2): qty 60, 30d supply, fill #2

## 2024-02-08 NOTE — OB Triage Note (Signed)
 Patient presents to triage for back pain and spotting. Patient states +cxt, -lof, +spotting, +FM. Rn notified E. Roe NP of arrival

## 2024-02-08 NOTE — Progress Notes (Signed)
 Patient has been see and evaluated by E. Roe NP and is safe for discharge.

## 2024-02-08 NOTE — Discharge Instructions (Signed)
 [facility]   [place] Triage Discharge Instructions    You were seen in triage for:  vaginal spotting, low back pain and low abdominal pain. Your urine test does not look infected and your exam showed that your cervix was closed with no bleeding noted. Both of your babies were seen on ultrasound, were active and had normal heart rates.   Your pain improved with tylenol and it is safe for you to use this as directed during pregnancy    Diagnosis:   Vaginal bleeding, low back pain    You are being discharged to: Home  The following tests are still pending: Vaginal culture (s) and Urine culture    You may resume your normal activity and diet.    You should follow up with your primary obstetric provider.       Call your primary obstetric provider or return to triage if you experience the following:  1. Worsening of your symptoms  2. Vaginal bleeding  3. Leakage of fluids from your vagina  4. Severe headache, chest pain, difficulty breathing  5. Severe abdominal pain, especially if on the right side  6. Baby moving less or not at all    If you cannot reach your obstetric provider, call 911 if it is an emergency.

## 2024-02-08 NOTE — OB Triage Note (Signed)
 OBSTETRICS TRIAGE NOTE      Chief Complaint: spotting and back pain    HPI     Madison Rose is a 33 y.o. [id number] at [redacted]w[redacted]d  with pregnancy complicated by risks outlined below who presents with spotting over the weekend and some lower back and abdominal pain today. She states she had some minimal spotting over the weekend which was darker red. No clots and no bright red bleeding were noted. She last saw bleeding the day before yesterday but came in to get checked out today because she noticed low back pain and lower abdominal pain today. The pain is positional and comes and goes. She has not had any gushing of fluid and no fever. She does report more frequent urination but otherwise no dysuria. Her bowel pattern has been regular. She has not had recent intercourse. She has not tried anything for her pain because she was worried about taking medications.  Patient had her initial prenatal appointment today at [facility]. She said she was told if she had any increased pain, she should go to the hospital. She also mentions that they were unable to find doptones at the office so she started to get worried about her babies and wanted to make sure everything was ok.      Pregnancy Risks     Didi twins     Hx of abdominoplasty     Hx of C/S     Sickle cell trait     Migraines    Obstetrical History     OB History   Gravida Para Term Preterm AB Living   7 1 1  0 5 1   SAB IAB Ectopic Multiple Live Births   0 0 0 0 1      # Outcome Date GA Lbr Len/2nd Weight Sex Type Anes PTL Lv   7 Current            6 Term 10/20/08 [redacted]w[redacted]d  3610 g (7 lb 15.3 oz) F *CSLT Spinal N LIV      Complications: Fetal Intolerance   5 AB      TAB      4 AB      SAB      3 AB      SAB      2 AB      TAB      1 AB      TAB         Obstetric Comments   12/23/23: No carrier screening results found.   Pt states she is a carrier of sickle cell trait. States the FOB is not.    G7 P1051 is correct, does not wish to add dates of prior TABs/SABs         PMH, PSH, SH, GYN  History, Allergies, and Current Medications reviewed and updated in eRecord.       Review of Systems     Negative other than noted above in HPI        Prenatal Labs     No results for input(s): "WBC", "HGB", "HCT", "PLT" in the last 168 hours.  No results for input(s): "NA", "K", "CL", "CO2", "UN", "CREAT", "GFRC", "GLU" in the last 168 hours. No results for input(s): "LD", "URIC", "ALT", "AST", "ALK", "TB" in the last 168 hours.   No results for input(s): "UTPR", "UCRR" in the last 168 hours.  Urine spot P/C ratio:  No results for input(s): "TPCREATRATIO" in the last 8760 hours.  Lab results: 11/25/23  1020 09/30/23  1800 08/28/23  1557   Syphilis Screen  --   --  Neg   HIV 1&2 ANTIGEN/ANTIBODY  --   --  Nonreactive   HBV S Ag  --   --  NEG   N. gonorrhoeae NAAT (PCR) NEGATIVE   < >  --    Chlamydia NAAT (PCR) NEGATIVE   < >  --     < > = values in this interval not displayed.    No results for input(s): "1SCRN" in the last 8760 hours.   No results for input(s): "GL0", "GL1H", "GL2H", "GL3H" in the last 8760 hours.        Physical Exam     Vitals:    02/08/24 1243   BP: 131/72   Pulse: 107   Resp: 18   Temp: 36 C (96.8 F)   TempSrc: Temporal   SpO2: 98%       General Appearance: healthy, alert, active, and no distress  Mental Status: Alert and oriented x 3  Mood/Affect:  calm, conversational and cooperative  Skin: warm, dry no rashes  HEENT: Normocephalic, atraumatic.  Cardiovascular: Regular rate and rhythm with no murmurs  Respiratory: Clear to auscultate, non labored on room air  Abdomen: soft, nontender, nondistended, no abnormal masses, no epigastric pain and Soft, gravid, non-tender.    Uterus: Gravid.  Appropriate size for gestational age  weeks size.  Non-tender to palpation.  Extremities: normal    External Genitalia: Unremarkable external genitalia without lesions.  Normal appearing labia majora/minora and introitus.  Vagina: Pink, ruggated vaginal mucosa without lesions.  Milkly white  discharge.  No  bleeding.  Cervix: Cervical examination as per below.  No lesions.  no leakage of fluid.    Cervical Examination: visually closed on speculum exam    Sterile Speculum Exam:  Pooling: negative  Nitrazine: negative  Ferning: negative  Membranes: intact  Wet Prep: no pathogens              Fetal Monitoring:  Doptones BABY A - 158  Doptones BABY B- 162    Both fetuses active on ultrasound with grossly normal appearing fluid      Assessment & Plan     Madison Rose is a 33 y.o. [id number] at [redacted]w[redacted]d with pregnancy complicated by risks outlined above who presents with vaginal spotting ( last two days ago) and lower abdominal pain and back pain.    Offered tylenol and hot packs, UA with micro sent  Speculum exam reveals normal appearing discharge, closed cervix and no visible blood. Vaginitis screen and GC/Chlamydia pending     Patient discomfort is improved with Tylenol and patient is feeling reassured by her exam.  UA reveals trace leukocytes and is otherwise negative. Culture pending. Patient feels comfortable and is stable for discharge. She will follow up at the office in 4 weeks as scheduled. Return precautions given          Madison Rose Madison Sereno, NP  1:57 PM

## 2024-02-08 NOTE — Progress Notes (Signed)
 COLLABORATIVE CARE (COCM) INITIAL ASSESSMENT     Program Information     Name: Madison Rose  DOB/AGE: 35-Sep-1992 35 y.o.   Primary Clinic for Collaborative Care: [facility])   PCP: [facility]  Date of assessment: 02/08/2024    Details  Collaborative Care (COCM)  Status: Enrolled  Effective Dates: 02/08/2024 - present  Responsible Staff: Arva Chafe  Social Drivers to be Addressed: No information to display        Initial Consultation   Population: Medicaid   Patient Concerns:  Madison Rose G7P1 [redacted] weeks pregnant presents with stress due to living situation.  Prior Mental Health Treatment:  None    Significant Medical Problems and History:     Patient Consent Obtained? Yes    Personal/Social Information   Education:     Employment:  Works per diem at [employer]  Family & Culture:     Personal:  She reports some conflict at the start of her pregnancy with FOB which has resolved.  Legal History:  NA  Current Supports:  Sisters, cousins, FOB  Housing Situation Details:  Lives with her cousin and daughter  Social Drivers of Health with Concerns     Tobacco Use: Unknown (02/08/2024)    Patient History     Smoking Tobacco Use: Never     Smokeless Tobacco Use: Unknown     Passive Exposure: Never   Financial Resource Strain: Not on file   Food Insecurity: Unknown (02/08/2024)    Hunger Vital Sign     Worried About Running Out of Food in the Last Year: Patient declined     Barista in the Last Year: Never true   Physical Activity: Not on file   Stress: Not on file   Social Connections: Not on file   Intimate Partner Violence: Unknown (02/08/2024)    Intimate Partner Violence     Fear of Current or Ex-Partner: Unable to ask at this time   Housing Stability: High Risk (02/08/2024)    Housing Stability Vital Sign     Unable to Pay for Housing in the Last Year: No     Number of Times Moved in the Last Year: 3     Homeless in the Last Year: Not on file        Clinical Information     Patient  has tried the following pharmaceutical interventions:  N/A  Patient is currently on the following medications and medications have been reconciled.  Current Outpatient Medications   Medication Sig    aspirin 81 mg EC tablet Take 2 tablets (162 mg total) by mouth daily for AMA, preeclampsia prevention.    folic acid (FOLVITE) 400 mcg tablet Take 2 tablets (800 mcg total) by mouth daily.    Prenatal Vit-Fe Fumarate-FA (PRENATAL PO) Take by mouth.    metoclopramide (REGLAN) 10 mg tablet Take 1 tablet (10 mg total) by mouth 4 times daily (before meals and nightly) for Migraine Headache.    acetaminophen (TYLENOL) 500 mg tablet Take 1 tablet by mouth every 6 hours as needed for pain.     Required outcome measures:  Anxiety Scale: GAD-7; Depression Scale: PHQ-9    Depression Scale (PHQ-9): PHQ Total Score: (Patient-Rptd) 3 (02/08/2024  9:46 AM)    Anxiety Scale (GAD-7): GAD-7 Score: (Patient-Rptd) 1 (02/08/2024  9:43 AM)      Clinician Observations:  Madison Rose is alert, oriented.  Past Suicide Attempts:  No  Safety Concerns:  N/A  Summary of Problems:  Madison Rose G7P1 [redacted] weeks pregnant presents with stress due to living situation. Madison Rose currently has a teenager daughter and they are staying with her cousin until she can find a new apartment. Madison Rose reports that her cousin has mood swings and has not made it easy to live there. She reports that other than that, she is feeling okay. She does report some issues with sleep because she is not comfortable. She has been working a lot to get out of the home and to make money in hopes to move on her own soon. Her and FOB have had some conflict due to him questioning if the baby is his, she reports things are getting better. Madison Rose reports no other concerns with her mood but does report she does not do anything other than work. Madison Rose can benefit from an episode of CoCM for Behavioral activation.  Psych Consultant Requested?   Yes       Last HbA1c:    Last Blood Pressure:  131/73    Future Appointments          Feb 08 2024   10:00 AM - FOLLOW UP VISIT (Arrived)  [facility], Obstetrics and Gynecology - Samule Ohm, LMSW           Mar 07 2024   09:15 AM - OB ULTRASOUND SCAN  [name] Maternal Fetal Medicine - RIS, HH POB PERI US2           Mar 08 2024   11:00 AM - OB CHECK  [facility], Obstetrics and Gynecology - Garnet Sierras, MD                 Follow-up visit in 2 week     Time Spent:  Time spent with patient (Minutes):  25  Time Spent Performing Chart Review (Minutes):  10  Total Time (Minutes):  35    Arva Chafe 02/08/2024 11:14 AM

## 2024-02-08 NOTE — Progress Notes (Signed)
 02/08/24 1105   Demographics   County of Residence [place]   Service Location [place]   [facility]   OBGYN Provider [facility]   Risk Factors   Risk Factors Housing issues;Other (comment)  (Perinatal programs)   Income Information   Income Situation Patient Employment (wages);WIC Benefits   Referrals & Resources Provided   Referrals & Resources Provided Perinatal/parenting support programs   Time Spent   Time Spent with Patient (min) 20       Pt here for new OBC, pregnant with twins. Her daughter came with her today, she was not in the room when SW stopped in to see her.  Pt is  still residing with her friend, actively looking for her own apartment; we discussed the challenges associated with this. Writer offered to send her a Mychart with links to various housing resources, she confirmed that she does use Mychart. She is back working and has WIC. She has not yet applied for SNAP- discussed that she can apply on line.      We discussed the perinatal programs available to her- she is interested in [employer], plans on bringing the twins to [place] [employer]. I informed her there may be a wait for services and she said that was okay, she was willing to wait.    Pt had to see a variety of providers today, SW will schedule Pt at next [facility] to continue to assess needs during her pregnancy.    Plan- Writer to send My chart link wit housing resource links to Pt  Writer to complete a BL referral  SW will ask for Pt to be added to the SW schedule after her next [facility], LMSW [phone]

## 2024-02-08 NOTE — Patient Instructions (Signed)
1 Hour Glucose Challenge    Diabetes is a condition in which the body does not properly use the glucose (sugar) that is taken in.  Diabetes is not caused by eating too many "sweets" but by an imbalance in the body.  This means that the body may not be making enough insulin or may not be able to use the insulin that is made, to "burn" the sugar.    Pregnancy is a time when this imbalance may occur.  It is therefore; very important to test women who are pregnant.  This is done through a simple screening test for Gestational Diabetes (diabetes in pregnancy).    You will drink a measured amount of a sugar solution called glucola.  Exactly one hour after finishing the drink, you will have a blood sugar level drawn.      INSTRUCTIONS    1. Eat and drink as usual before the day of the test.  It is best not to eat a large              carbohydrate meal immediately before the test.    2. Drink all of the glucola solution within 5 minutes.  Keep it refrigerated, as it tastes              better cold.    3. Note the time when you finish the drink and write it on your lab slip.    4. After you finish the drink do not eat, drink, smoke or chew gum.    5. Your blood needs to be drawn exactly 1 hour after completely drinking the                          glucola.  Therefore you need to go promptly to the lab of your choice (see listing               on back of lab slip) right after drinking the glucola.    6. Be sure to arrive at the lab at least 15 minutes before the time for your blood                     draw in order to be processed.  Tell the lab personnel you are having a timed               test.    7. You will also have a blood test for anemia drawn at the same time.    8. The result will be back in the office in 2 days.  We will call you if you lab results                 are abnormal.  Otherwise we will review the results at your next visit.       wOCommon Discomforts:  SECOND TRIMESTER    You have made it through the  first third of your pregnancy! The middle 3 months--the second trimester--are usually the easiest. Although some women still have nausea and feel especially tired for their whole pregnancy, most women are feeling much better.   Your growing baby and uterus can cause some discomforts, though.  Here are 4 common ones and ways to deal with them:    Back Pain  Lower back pain can be caused by muscle strain (from stretching, bending or lifting).   Sometimes your hormones change and leave you more at risk for back pain.   Weak muscles also strain and hurt more easily.  Here are some things that can lesson you discomfort:  • Use proper body mechanics by lifting with your back straight and your knees bent.  Do not twist while lifting or pulling.  Stand and sit with your back as straight as possible.   • Avoid standing too long in one spot. If you can, rest one foot on a footstool or step while you are standing.  Change which foot you lean on every few minutes.   • Wear a special support girdle for pregnant women.   You can find these at maternity shops or a medical supply store.   • Use a footstool when you have to sit for long periods, so your legs are no dangling.  • Ask your partner or a friend for a back rub, or use a heating pad on low setting to relieve aches. Sometimes a warm, not hot, bath feels good too.  • Try to sleep on a firm, supportive mattress. Put a board under your mattress if it is too soft.   Use lost of pillows!  Wedge one under your belly when you are on your side and use one between your knees while on your side too.  Avoid lying flat on your back.  Lean into a pillow placed under your side instead.  • Do pelvic tilt exercises.  On your hands and knees like an angry cat.  Rock forward and backward a little to stretch out your back.   On your back, bend your knees, then lift your bottom up in the air so only your head, shoulders and feet touch the floor.  Hold this position for a few seconds, then relax  and do it again.   • Wear good walking shoes as much as possible.  Try not to wear shoes with a heel higher than 2 inches.  If your backache “comes and goes” in a regular pattern or if you have leakage or bleeding from your vagina, call your health care provider right away.    Hemorrhoids  Hemorrhoids are also called piles.  They are extra-large blood vessels in your rectum (where you have bowel movements) that itch and sometimes bleed.  They are caused    by the increased pressure of your baby in your pelvis (area between your hips).  The pressure of the baby blocks off the blood vessels and causes them to get too big.  Things that help keep hemorrhoids from happening to getting too bad include the following:  • Avoid constipation.  Eat lots of fruits and vegetables so you have a bowel movement every day if possible.  Ask your health care provider if you need a laxative to help you have regular bowel movements.  • Do not strain or push when having a bowel movement.  Breathe slowly, relax and let it come out without pushing.  • Drink more fluids and eat foods high in fiber, like whole grains.  • Do not have anal sex (in which the man puts his penis in your anus, where bowel movements come out).  • Take stiz baths with cool water 2-3 times a day.  A sitz bath is a little tub that fits on your toilet and lets you soak your bottom without getting the rest of your body wet. You can buy a stiz bath kit at a drugstore or medical supply store.   • Use ice packs or witch hazel pads (like Tucks) to   take the swelling down.   • Do Kegel exercises.  A Kegel exercise is when you tighten the muscles in your bottom. Next time you urinate, try to stop the urine.  The muscle tightening you do to stop your urine is a Kegel exercise.  After you figure out how, only do Kegel exercises when you are not urinating. Starting and stopping your urine flow too much can lead to infections or bladder problems.  • Try to sit with your legs  crossed tailor style (both feet tucked under the opposite leg, with the knees sticking out) or sit on hard chairs.  Do not sit too long in any chair.  • I you notice bleeding when you go to the bathroom, talk with your health care provider.     Leg Cramps  Leg cramps happen in pregnancy, but it is unclear why.  Things that can help prevent or lessen them include the following:  • Get more calcium in your diet.  Se the guide on Nutrition during Pregnancy for some ways of getting calcium in your diet. In addition to dairy products, there is calcium-fortified orange juice.  Ask your health care provider if you can take a calcium supplement (like Tums).   • Exercise.  Even walking 30 minutes a day can help.  And you can walk for 10 minutes 3 times a day for the same benefit.   • If you get a cramp, try straightening your leg.  Sometimes pointing your toe toward your knee helps.  Stand a foot or so from a wall and lean toward it keeping your knees straight and your feet flat on the floor.  • Massage or apply mild heat with a warm wash cloth or heating pad on low to the cramping area.  If you feel a hard knot or hot spot on your leg, especially in the calf (back of the lower leg that does not go away, call you health care provider right away.    Varicose Veins  Varicose veins show up as blue line under the skin usually on your lower legs.  Sometimes they are thick like cords.  They can be due to your hormones, to pressure on your pelvis from the baby, or to our family tendency (someone else in your family had them).  Things you can do to help present varicose veins includes the following:  • Avoid constrictive (tight) clothing, especially if it is tight in your waist or hip areas.  Also avoid knee socks or hose with elastic bands at the top.  • Try not to sit or stand for long periods without moving around.   • Use good posture and good body mechanics.  (See the section of this guide on back pain).    If you get varicose  veins, these things can help keep them from bothering you or getting worse:  • Wear good support hose.  You can buy support hose at the maternity shops or medical supplies stores.   • Raise your legs on a footstool when sitting.  Lie down and put your feet up higher than your head every chance you get.  • Talk with your HCP about other things to keep you comfortable.     Many women never have any of these discomforts during their pregnancy.  Others have a lot of discomforts.  Be sure to tell your Health Care Provider about anything that you find uncomfortable.  Write down your questions as they come up   so you will remember to ask them during your next office visit or phone call.      Warning Signs: SECOND TRIMESTER    No one wants something to go wrong during her pregnancy.  It can be really temping to not tell anyone when something happens; you just hope it will go away on its own.  Usually something that worries you is not a big problem but it is important to talk to your health care provider any time you have a worry.  Most of the time, bigger problems can be prevented if you get help early enough.  During your second trimester, there are three warning signs that you should call your Health Care Provider about:    Bleeding  After your twentieth week or fifth month of pregnancy, bleeding can be due to several causes.  There can be problems with the placenta.  Placenta previa is when the placenta forms across the cervix (opening to the uterus): placenta abruption is when the placenta pulls ways from the inside of the uterus.  Sometimes sores or scratches on your cervix or vagina can cause bleeding.  Vaginal infections or polyps (see the guide on Sexually Transmitted Diseases) can cause bleeding.  Preterm labor may also cause bleeding.      You may or may not have pain with vaginal bleeding.  It is very important to call your Health Care Provider or go to the hospital to be examined any time you have vaginal  bleeding during your pregnancy.    Pain  Abdominal or pelvic (between your hips) pain during your pregnancy needs to be checked out by your Health Care Provider.       Non-pregnancy causes of pain need to be considered along with pregnancy causes.  Back pain that comes and goes in a pattern or does not go away at all need to be checked out.  Call your Health Care Provider or if the pain is really bad or sudden, go to the hospital where you plan to deliver so you can be examined.     Infection and Fever  When you are pregnant, you can have “normal” colds and infections.  But it is important to tell your Health Care Provider if you have:  • A fever of more than 100.4 degrees F or 38.0 C  • A burning sensation or pain when you urinate or a need to go to the bathroom often  • Green or yellow drainage from your nose, or a tendency to cough up green or yellow phlegm.  • Toothaches, bleeding gums, or sores in the mouth that do not go away.  • Vomiting or diarrhea that last more than an hour or so.  • Any symptom that worries you    If you have a fever, you need to increase the amount of fluids you drink. Fluids help you flush infection out of your body.  They also help keep you from getting dehydrated (dried out), which is bad for you and the baby. If you have an infection it is also best to get more rest so that your body has the energy to fight the germs.   Antibiotics (medicines that treat infections) are safe during pregnancy and may be necessary to protect your baby from any possible harm from your infection.  Your Health Care Provider will order medicine if you need it.     Remember--you and your Health Care Provider are a team, working to help you have a   safe pregnancy and a healthy baby.  Be sure to let your Health Care Provider know of anything that concerns you.  Ignoring or worrying about a problem won’t help it to go away! Getting help right away can make a big difference in your health and your baby’s  safety.

## 2024-02-09 ENCOUNTER — Other Ambulatory Visit: Payer: Self-pay

## 2024-02-09 DIAGNOSIS — O30042 Twin pregnancy, dichorionic/diamniotic, second trimester: Secondary | ICD-10-CM

## 2024-02-09 DIAGNOSIS — B9689 Other specified bacterial agents as the cause of diseases classified elsewhere: Secondary | ICD-10-CM

## 2024-02-09 DIAGNOSIS — N760 Acute vaginitis: Secondary | ICD-10-CM

## 2024-02-09 LAB — AEROBIC CULTURE
Aerobic Culture: 0
Aerobic Culture: 0

## 2024-02-09 MED ORDER — METRONIDAZOLE 500 MG PO TABS *I*
500.0000 mg | ORAL_TABLET | Freq: Two times a day (BID) | ORAL | 0 refills | Status: AC
Start: 2024-02-09 — End: 2024-02-16
  Filled 2024-02-09: qty 14, 7d supply, fill #0

## 2024-02-09 MED ORDER — CLASSIC PRENATAL 28-0.8 MG PO TABS *I*
1.0000 | ORAL_TABLET | Freq: Every day | ORAL | 1 refills | Status: AC
Start: 2024-02-09 — End: 2024-08-24
  Filled 2024-02-09: qty 30, 30d supply, fill #0
  Filled 2024-03-15: qty 30, 30d supply, fill #1
  Filled 2024-05-03 – 2024-05-17 (×2): qty 30, 30d supply, fill #2
  Filled 2024-07-25: qty 30, 30d supply, fill #3

## 2024-02-09 NOTE — Telephone Encounter (Signed)
 TC to pt to review lab results +for BV and Rx sent to pharmacy. Pt agreeable to plan.  Pt also requested a refill for Prenatal Vit to be sent to phamracy. Will send to [facility]

## 2024-02-09 NOTE — Addendum Note (Signed)
 Addended by: Loraine Grip on: 02/09/2024 01:18 PM     Modules accepted: Orders

## 2024-02-09 NOTE — Telephone Encounter (Signed)
-----   Message from Nurse Cammie Mcgee sent at 02/09/2024 10:46 AM EST -----    ----- Message -----  From: Edi, Lab In [place]  Sent: 02/08/2024   3:11 PM EST  To: Ob Obs Discharged

## 2024-02-09 NOTE — Telephone Encounter (Signed)
 Vaginitis swab/culture positive for BV that was sent in OB Triage on 02/08/24.  RX  for  Flagyl po sent to patient's preferred pharmacy to treat this infection.  Message routed to [facility] Nursing staff to contact the patient and notify her of this information.     1. BV (bacterial vaginosis) (Primary)    - metroNIDAZOLE (FLAGYL) 500 mg tablet; Take 1 tablet (500 mg total) by mouth 2 times daily for 7 days for Vaginosis caused by Bacteria. Avoid alcohol.  Dispense: 14 tablet; Refill: 0       Honor Junes, NP

## 2024-02-09 NOTE — Telephone Encounter (Signed)
 RX provided for PNV.  Message routed to [facility] nursing staff to notify pt that PNV may  not be covered by her insurance as it can be purchased OTC.  If this medication is not filled she will need to purchase OTC.     Dichorionic diamniotic twin pregnancy in second trimester    - prenatal multivitamin (PRENAVITE) tablet; Take 1 tablet by mouth daily for Pregnancy, Please provide patient with whatever PNV insurance will cover..  Dispense: 90 tablet; Refill: 1     Honor Junes, NP

## 2024-02-09 NOTE — Addendum Note (Signed)
 Addended by: Lonna Duval on: 02/09/2024 02:33 PM     Modules accepted: Orders

## 2024-02-16 ENCOUNTER — Ambulatory Visit: Payer: BLUE CROSS/BLUE SHIELD | Admitting: Podiatry

## 2024-02-17 ENCOUNTER — Ambulatory Visit: Payer: BLUE CROSS/BLUE SHIELD

## 2024-02-18 ENCOUNTER — Telehealth: Payer: Self-pay

## 2024-02-18 NOTE — Telephone Encounter (Signed)
 [facility] [employer] Worker (CHW) called patient on 02-18-24 to schedule the intake for [employer].  Intake set up for 02-24-23 at 10 am.        Maren Reamer  Baby [facility] Program/ [facility]  [facility] Worker  [facility] Social work Division  [address] OBGYN office  [phone]

## 2024-02-22 ENCOUNTER — Other Ambulatory Visit: Payer: Self-pay

## 2024-02-22 NOTE — Progress Notes (Signed)
 Behavioral Health Collaborative Care -  Progress Note     Contact Type:  Telephone Contact     Review Flowsheet          02/08/2024   PHQ Scores   PHQ Q9 - Better Off Dead 0   PHQ Calculated Score 3       Details          Patient-reported                 02/08/2024     9:43 AM   GAD-7 Dates   Total Score 1        Patient-reported       Risk Assessment:  ASSESSMENT OF RISK FOR SUICIDAL BEHAVIOR  Changes in risk for suicide from baseline Formulation of Risk and/or previous intake, including newly identified risk, if any: none  Violence risk was assessed and No Change noted from baseline formulation of risk and/or previous assessment.    Visit Diagnosis:    No diagnosis found.    Interventions:  Continued to develop therapeutic relationship  Supportive Psychotherapy    Response to Intervention:   Madison Rose presents as Cooperative with this Clinical research associate. Mood is normal with  sleepy  affect. Her thoughts are linear, goal directed, and coherent with No suicidal ideation, No homicidal ideation, No delusions, and No obsessions/compulsions. She is able to engage in today's session and reports she has low energy. She shares with Clinical research associate that she feels like something is off, she has been unable to do much and only feels okay if she is laying down. She does have to get blood drawn today and hopes to figure out why she feels that way. Writer briefly discussed [employer].      Plan:  Psychotherapy continues as described in care plan; plan remains the same.    Next Appt: in 2 weeks    Time Spent:  Time spent with patient (Minutes): 25  Time Spent Performing Chart Review (Minutes): 5  Total Time (Minutes): 30    Arva Chafe

## 2024-02-24 ENCOUNTER — Observation Stay
Admission: AD | Admit: 2024-02-24 | Discharge: 2024-02-24 | Disposition: A | Discharge status: Home / Self Care | Payer: MEDICAID | Source: Ambulatory Visit | Attending: Obstetrics & Gynecology | Admitting: Obstetrics & Gynecology

## 2024-02-24 ENCOUNTER — Other Ambulatory Visit: Payer: Self-pay

## 2024-02-24 ENCOUNTER — Encounter: Payer: Self-pay | Admitting: Gastroenterology

## 2024-02-24 ENCOUNTER — Ambulatory Visit: Payer: MEDICAID | Attending: MS"

## 2024-02-24 DIAGNOSIS — O09521 Supervision of elderly multigravida, first trimester: Secondary | ICD-10-CM | POA: Insufficient documentation

## 2024-02-24 DIAGNOSIS — O30042 Twin pregnancy, dichorionic/diamniotic, second trimester: Secondary | ICD-10-CM

## 2024-02-24 DIAGNOSIS — R109 Unspecified abdominal pain: Secondary | ICD-10-CM | POA: Insufficient documentation

## 2024-02-24 DIAGNOSIS — Z3A18 18 weeks gestation of pregnancy: Secondary | ICD-10-CM | POA: Insufficient documentation

## 2024-02-24 DIAGNOSIS — R350 Frequency of micturition: Secondary | ICD-10-CM | POA: Insufficient documentation

## 2024-02-24 DIAGNOSIS — O99891 Other specified diseases and conditions complicating pregnancy: Principal | ICD-10-CM

## 2024-02-24 DIAGNOSIS — R42 Dizziness and giddiness: Secondary | ICD-10-CM | POA: Insufficient documentation

## 2024-02-24 DIAGNOSIS — M549 Dorsalgia, unspecified: Secondary | ICD-10-CM

## 2024-02-24 LAB — URINALYSIS WITH MICROSCOPIC
Blood,UA: NEGATIVE
Glucose,UA: NEGATIVE
Ketones, UA: NEGATIVE
Nitrite,UA: NEGATIVE
Protein,UA: NEGATIVE
Specific Gravity,UA: 1.017 (ref 1.002–1.030)
pH,UA: 6.5 (ref 5.0–8.0)

## 2024-02-24 MED ORDER — CYCLOBENZAPRINE HCL 5 MG PO TABS *I*
5.0000 mg | ORAL_TABLET | Freq: Three times a day (TID) | ORAL | 0 refills | Status: AC | PRN
Start: 2024-02-24 — End: 2024-03-25
  Filled 2024-02-24: qty 10, 3d supply, fill #0

## 2024-02-24 NOTE — Progress Notes (Signed)
 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  Writer provided overview of program, reviewed components of program services (goals, frequency of visits, documentation, length of services, confidentiality limits), discussed [facility].  Pt expressed her desire to enroll with program.       During this intake, we talked about areas that pt could use additional support. Housing was identified as an area of significant stress for pt.  She & 15yo daughter are currently staying with her sister, conditions are a bit crowded.  She has been searching for her own place, has some leads on places in [place].  She is working on International aid/development worker for a move, but notes that working her usual number of hours has been difficult during this (twin) pregnancy [as frequent migraines have been debilitating at times].  She also expresses interest in linking with resources around breastfeeding/childbirth/infant care.  [facility] birthing classes and doula services discussed.    Pt identified her sister and FOB Loraine Leriche) as her main supports - though shared that FOB was not initially supportive of the pregnancy.  They have been together ~2 years, will be first baby for him.  Pregnancy was unplanned. While not initially excited about having a baby, pt shares that he is now more involved.  IPV/safety concerns denied.  His family is supportive of pregnancy.  Plan is for [facility] to provide childcare in the future.     Pt denies any past/current concerns with mental health or substance use.   No PPD concerns with previous pregnancy - though pt does share that her mother was very involved with first baby/delivery, passed away last year.   She is planning to breastfeed (breastfed daughter for ~4 months), will use [facility] for peds care.  She is uncertain what kind of assistance she may need around baby preparations, is planning a baby shower with family/friends.    .     Plan:  1.CHW - Maren Reamer to follow up with pt about scheduling a time to meet in person, drop off paperwork.    2.CHW to continue to assess areas in which Baby Love program can provide support  3.SW to remain available, monitor pt's needs via CHW, will plan to check in with pt at her next OB appt to review childbirth classes and assist with sign up as needed    Elissa Hefty, LMSW [phone]

## 2024-02-24 NOTE — Progress Notes (Signed)
 02/24/2024     Honor Junes, NP  [address]  [address]  [place],  [place] [address]       Re: Madison Rose   DOB: Sep 03, 1991   MR#: [id number]     Consultation date: 02/24/2024  Counseling site: [place]  Counselor/MD: Lafonda Mosses Velvet Moomaw/ Vonda Antigua      Reproductive Genetic Consultation Summary    Referral indication:   Advanced maternal age  Dichorionic/Diamniotic twin pregnancy    Pregnant? Yes. Gestational age on consultation date: 75 5/7.   Maternal age at [facility]: 25.    Relevant risks:   Age risk for aneuploidy: 1 in 200  for each baby  Age risk specifically for Down syndrome: 1 in 350 for each baby    Pregnancy/Family History:    This is the first pregnancy for Madison Rose and her partner, Madison Rose. Madison Rose has one healthy child from a previous relationship and had one ectopic pregnancy.    The patient reports no infection, fever, or potentially teratogenic exposures. Madison reports some bleeding earlier in the pregnancy.    The personal and family medical histories were reviewed. The personal and family history information collected was targeted to diseases that are known to have genetic components. Medical information that was not pertinent to genetics was not included in the risk assessment.     Patient Personal History:  Unremarkable    Patient Family History:  Unremarkable    Partner Personal History:  Unremarkable    Partner Family History:  Unremarkable      The patient  reports that she is of African-American descent and her partner has [place] and [place] ancestry. Ashkenazi Jewish ancestry was denied. The family and personal medical histories were otherwise unremarkable. No consanguinity was reported.    Consult Summary  The patient is 33 years old. Women who are of advanced maternal age (over the age of 51) are at an increased risk for pregnancies affected by aneuploidy. The patient's age-related risk of having a child with a chromosome abnormality is estimated to be 1 in 200.  The risks,  benefits, and limitations of cell-free DNA testing, and anatomic ultrasound screening were reviewed.  Miria expressed her understanding, and declined cell-free DNA testing.    Tests/Procedures discussed:    ACOG-recommended carrier screening: Ordered by your office but not yet drawn.  Ultrasound: Discussed uses and limitations  Cell-free DNA testing: Reviewed the risks, benefits, and limitations. Patient declined    Comments: This patient declined cell-free DNA testing. Maternal serum AFP screening can be offered to this patient for ONTD screening. Anatomic ultrasound evaluation has been scheduled.     Results: No testing was ordered today.      Thank you for referring Madison Rose to Reproductive Genetics. Thank you for referring Madison Rose to Reproductive Genetics. Our consultation lasted approximately 40 minutes on the calendar day of the encounter. This includes time spent in face-to-face counseling with [facility] as well as records review, research of testing options/strategies relevant to the visit, and documentation. We appreciate the opportunity to participate in the care of your patient.     Sincerely,      Lum Keas, MS, [name]  Certified Dentist, Department of Reproductive Genetics  Fetal Care Coordinator, Division of Neonatology  [facility]  [phone] (phone)  [phone] (fax)  [email or web address]

## 2024-02-24 NOTE — Discharge Instructions (Signed)
 Reason for Triage Visit:  abdominal pain and lightheadness  Destination: Home  Mode: Ambulatory    Procedures done in triage: Cervical exam and Ultrasound  Pending Laboratory Data: Urine culture    Diet: Regular    Activity: Regular activity    Special Instructions: Call or return to triage if you have worsening of any of your symptoms, if you have more than 6 contractions in an hour, if you have vaginal bleeding or leakage of clear fluid, if you have headache or vision changes, if you have right-sided pain, or if you don't feel the baby moving.     If you cannot reach your OB/Gyn or Midwife, call the Labor and Delivery Unit or 911 if it is an emergency.    Follow-up with your [facility] provider as previously instructed.

## 2024-02-24 NOTE — OB Triage Note (Signed)
 Patient presents to triage due to having some lightheaded episode at home. She states she was not doing anything strenuous prior and is unsure why it happened. She denies VB,and LOF. She states she is having some back pain and some pressure and discomfort when she urinates. VSS. Provider team notified of patient arrival. Elenore Rota, RN

## 2024-02-24 NOTE — OB Triage Note (Signed)
 OBSTETRICS EVALUATION      Primary OB-GYN: [facility]    Chief Complaint: lightheaded at home    HPI     Kayton Ripp is a 33 y.o. [id number] at [redacted]w[redacted]d with pregnancy complicated by risks outlined below who presents with an episode of lightheadedness at home as well as some pressure and increased urinary frequency. Patient was not doing anything in particular but suddenly felt lightheaded. No LOC. Patient also having some back pain - took tylenol just prior to presentation. Reports she called the office to discuss and was told to present to [facility] triage. No burning with urination. No LOF or VB. Patient has not had any lightheadedness since that episode. Denies headaches, vision changes, chest pain or SOB.     Patient's medications, allergies, past medical, surgical, social and family histories were recorded, reviewed and updated as appropriate.     Pregnancy Risks     Didi twins     Hx of abdominoplasty     Hx of C/S     Sickle cell trait     Migraines    Obstetrical History     OB History   Gravida Para Term Preterm AB Living   7 1 1  0 5 1   SAB IAB Ectopic Multiple Live Births   0 0 0 0 1      # Outcome Date GA Lbr Len/2nd Weight Sex Type Anes PTL Lv   7 Current            6 Term 10/20/08 [redacted]w[redacted]d  3610 g (7 lb 15.3 oz) F *CSLT Spinal N LIV      Complications: Fetal Intolerance   5 AB      TAB      4 AB      SAB      3 AB      SAB      2 AB      TAB      1 AB      TAB         Obstetric Comments   12/23/23: No carrier screening results found.   Pt states she is a carrier of sickle cell trait. States the FOB is not.    G7 P1051 is correct, does not wish to add dates of prior TABs/SABs         Prenatal Labs     No results for input(s): "WBC", "HGB", "HCT", "PLT" in the last 168 hours.  No results for input(s): "NA", "K", "CL", "CO2", "UN", "CREAT", "GFRC", "GLU" in the last 168 hours. No results for input(s): "LD", "URIC", "ALT", "AST", "ALK", "TB" in the last 168 hours.   No results for input(s): "UTPR", "UCRR" in the last 168  hours.    Urine spot P/C ratio:      Lab results: 02/08/24  1038   TP Creatinine ratio,UR 0.07             Lab results: 02/08/24  1353 09/30/23  1800 08/28/23  1557   Syphilis Screen  --   --  Neg   HIV 1&2 ANTIGEN/ANTIBODY  --   --  Nonreactive   HBV S Ag  --   --  NEG   N. gonorrhoeae NAAT (PCR) NEGATIVE   < >  --    Chlamydia NAAT (PCR) NEGATIVE   < >  --     < > = values in this interval not displayed.    No results for input(s): "1SCRN" in the  last 8760 hours.   No results for input(s): "GL0", "GL1H", "GL2H", "GL3H" in the last 8760 hours.      Physical Exam     Vitals:    02/24/24 1810   BP: 111/62   Pulse: 93   Resp: 17   Temp: 36.7 C (98.1 F)   TempSrc: Temporal   SpO2: 98%       Mikaela, RN was immediately available for exam    General Appearance: healthy, alert, active, and no distress  Mental Status: Alert and oriented x 3  Mood/Affect: Mood  happy and Affect  congruent with mood  HEENT: Normocephalic, atraumatic.  Cardiovascular: regular rate and rhythm   Respiratory: CTAB  Abdomen: Soft, gravid, non-tender.  Neurological:  grossly normal  Extremities: normal    External Genitalia: Unremarkable external genitalia without lesions.  Normal appearing labia majora/minora and introitus.     Closed on digital exam    Bedside US  Twin A vertex - 150  Twin B breech - 145  Normal appearing fluid with each  Active fetus x2    Assessment & Plan     Alpha Chouinard is an 33 y.o. Z6X0960 at [redacted]w[redacted]d with pregnancy complicated by risks outlined above who presents with lightheadedness at home and some back/abdominal pain. VSS. Physical exam reassuring. UA unremarkable, culture sent. Patient took tylenol prior to arrival - feeling a little better. Cervix closed and thick on digital exam. Does not want to take flexeril here but interested in a Rx for home - sent a few tabs to the pharmacy. Discussed discomforts of pregnancy and potential causes of preterm cramping including dehydration, intercourse, etc. Advised  conservative measures such as heat, tylenol, rest, and pregnancy support band. Also discussed importance of adequate hydration and slower movements for body to adjust given fluid changes in pregnancy. Also recommended possible use of compression stockings. Discharged home with return precautions. Next prenatal appointment scheduled 03/07/24.    D/w Dr. Trish Fountain A. Maple Hudson, MD  OBGYN R2

## 2024-02-24 NOTE — Progress Notes (Signed)
 Discharge paperwork reviewed with pt and all questions answered. Pt states understanding. Pt ambulatory off unit for safe discharge home at this time.    Jason Coop, RN

## 2024-02-25 ENCOUNTER — Other Ambulatory Visit: Payer: Self-pay

## 2024-02-25 DIAGNOSIS — F419 Anxiety disorder, unspecified: Secondary | ICD-10-CM

## 2024-02-25 NOTE — Progress Notes (Addendum)
 Behavioral Health Collaborative Care -  Psychiatrist Consultation     Reason for Consultation: New enrollee     GA: [redacted]w[redacted]d     PHQ:   Review Flowsheet          02/08/2024   PHQ Scores   PHQ Q9 - Better Off Dead 0   PHQ Calculated Score 3       Details          Patient-reported                GAD:      02/08/2024     9:43 AM   GAD-7 Dates   Total Score 1        Patient-reported        Diagnosis:  Encounter Diagnoses   Name Primary?    Anxiety Yes       Narrative:  Madison Rose is a 33 y.o. at [redacted]w[redacted]d presents with stress and anxiety due to housing situation and conflict with FOB. [name] and her teenage daughter are currently staying with a cousin until she finds a place of her own. [name] shares that her cousin has frequent mood swings that make the living environment stressful. She has been working a lot to save money and to be outside the home as often as possible. [name] and the FOB have been in conflict due to him questioning paternity and requesting DNA test. [name] agreed to do so and states things have gotten better between them. She does report issues with sleep but reports this is due to being uncomfortable. She also shares that she does not do anything other than work or do things for her teenage daughter. [name] can benefit from an episode of CoCM.     Treatment Recommendations:  Enroll in collaborative care  Care manager to monitor symptoms using PHQ/GAD screens  Care manager to provide brief psychotherapy and behavioral activation.   Care manager to collaborate with patient, provider and consulting psychiatrist regarding care plan as appropriate.     Time Spent:  Time spent with patient (Minutes): 0  Time Spent Performing Chart Review (Minutes): 5  Total Time (Minutes): 5    The above treatment considerations and suggestions are based on relevant information available in the medical record and in discussion with other members of the team including residents, medical providers, and/or therapist. I  have not personally examined the patient. All recommendations should be implemented with consideration of the patient's relevant prior history and current clinical status. Please feel free to contact me with any questions about the care of this patient.    Arva Chafe, LMSW  Kelli Hope, MD

## 2024-02-26 ENCOUNTER — Telehealth: Payer: Self-pay

## 2024-02-26 ENCOUNTER — Other Ambulatory Visit: Payer: Self-pay

## 2024-02-26 DIAGNOSIS — N390 Urinary tract infection, site not specified: Secondary | ICD-10-CM

## 2024-02-26 LAB — AEROBIC CULTURE

## 2024-02-26 MED ORDER — NITROFURANTOIN MONOHYD MACRO 100 MG PO CAPS *I*
100.0000 mg | ORAL_CAPSULE | Freq: Two times a day (BID) | ORAL | 0 refills | Status: AC
Start: 2024-02-26 — End: 2024-03-06
  Filled 2024-02-26: qty 10, 5d supply, fill #0

## 2024-02-26 NOTE — Telephone Encounter (Signed)
 Urine culture from [facility] Triage visit on 03/12 positive for UTI.   RX  for  Macrobid sent to patient's preferred pharmacy to treat this infection.  Message routed to [facility] Nursing staff to contact the patient and notify her of this information.     1. UTI (urinary tract infection) (Primary)    - nitrofurantoin mono/macro (MACROBID) 100 mg capsule; Take 1 capsule (100 mg total) by mouth 2 times daily for 5 days for Simple Infection of the Urinary Tract.  Dispense: 10 capsule; Refill: 0       Honor Junes, NP

## 2024-02-26 NOTE — Telephone Encounter (Signed)
-----   Message from Nurse Cammie Mcgee sent at 02/26/2024  8:18 AM EDT -----    ----- Message -----  From: Edi, Lab [facility]  Sent: 02/24/2024   8:00 PM EDT  To: Ob Obs Discharged

## 2024-02-26 NOTE — Telephone Encounter (Signed)
 Writer attempted to call pt to notify her of + UTI.  LMOVM for pt to return call to office.  Will also send a [facility].

## 2024-02-29 ENCOUNTER — Other Ambulatory Visit
Admission: RE | Admit: 2024-02-29 | Discharge: 2024-02-29 | Disposition: A | Discharge status: Home / Self Care | Payer: MEDICAID | Source: Ambulatory Visit

## 2024-02-29 ENCOUNTER — Other Ambulatory Visit: Payer: Self-pay

## 2024-02-29 ENCOUNTER — Telehealth: Payer: Self-pay

## 2024-02-29 DIAGNOSIS — Z3A00 Weeks of gestation of pregnancy not specified: Secondary | ICD-10-CM | POA: Insufficient documentation

## 2024-02-29 DIAGNOSIS — O99012 Anemia complicating pregnancy, second trimester: Secondary | ICD-10-CM | POA: Insufficient documentation

## 2024-02-29 DIAGNOSIS — O09522 Supervision of elderly multigravida, second trimester: Secondary | ICD-10-CM | POA: Insufficient documentation

## 2024-02-29 DIAGNOSIS — R69 Illness, unspecified: Secondary | ICD-10-CM | POA: Insufficient documentation

## 2024-02-29 DIAGNOSIS — O09519 Supervision of elderly primigravida, unspecified trimester: Secondary | ICD-10-CM | POA: Insufficient documentation

## 2024-02-29 DIAGNOSIS — O3510X0 Maternal care for (suspected) chromosomal abnormality in fetus, unspecified, not applicable or unspecified: Secondary | ICD-10-CM | POA: Insufficient documentation

## 2024-02-29 DIAGNOSIS — Z113 Encounter for screening for infections with a predominantly sexual mode of transmission: Secondary | ICD-10-CM | POA: Insufficient documentation

## 2024-02-29 DIAGNOSIS — O0992 Supervision of high risk pregnancy, unspecified, second trimester: Secondary | ICD-10-CM | POA: Insufficient documentation

## 2024-02-29 DIAGNOSIS — O0991 Supervision of high risk pregnancy, unspecified, first trimester: Secondary | ICD-10-CM | POA: Insufficient documentation

## 2024-02-29 DIAGNOSIS — R7309 Other abnormal glucose: Secondary | ICD-10-CM | POA: Insufficient documentation

## 2024-02-29 LAB — COMPREHENSIVE METABOLIC PANEL
ALT: 9 U/L (ref 0–35)
AST: 13 U/L (ref 0–35)
Albumin: 3.4 g/dL — ABNORMAL LOW (ref 3.5–5.2)
Alk Phos: 55 U/L (ref 35–105)
Anion Gap: 10 (ref 7–16)
Bilirubin,Total: <0.2 mg/dL (ref 0.0–1.2)
CO2: 21 mmol/L (ref 20–28)
Calcium: 8.5 mg/dL — ABNORMAL LOW (ref 8.8–10.2)
Chloride: 103 mmol/L (ref 96–108)
Creatinine: 0.62 mg/dL (ref 0.51–0.95)
Glucose: 143 mg/dL — ABNORMAL HIGH (ref 60–99)
Lab: 6 mg/dL (ref 6–20)
Potassium: 4 mmol/L (ref 3.3–5.1)
Sodium: 134 mmol/L (ref 133–145)
Total Protein: 6.5 g/dL (ref 6.3–7.7)
eGFR BY CREAT: 119 *

## 2024-02-29 LAB — TYPE AND SCREEN FOR PNP
ABO RH Blood Type: O POS
Antibody Screen: NEGATIVE

## 2024-02-29 LAB — GLUCOSE TOLERANCE, 1 HOUR: Glucose,50gm 1HR: 144 mg/dL — ABNORMAL HIGH (ref 63–135)

## 2024-02-29 LAB — GTT COLLECT INFO

## 2024-02-29 MED ORDER — FERROUS SULFATE 325 (65 FE) MG PO TABS *WRAPPED* *I*
325.0000 mg | ORAL_TABLET | Freq: Every day | ORAL | 1 refills | Status: AC
Start: 2024-02-29 — End: 2024-08-27
  Filled 2024-02-29: qty 30, 30d supply, fill #0
  Filled 2024-07-25: qty 30, 30d supply, fill #1

## 2024-02-29 NOTE — Telephone Encounter (Addendum)
 Patient aware that she needs a 3 hour glucola, instructed NPO after midnight the night before her test and she is to be at the lab for 3 hours. Instructed to call any [facility] lab that is close to her (except the lab in the [address]) to make an appt for first thing in the morning of the date she chooses. Patient will pick up her ferrous sulfate supplement.

## 2024-02-29 NOTE — Telephone Encounter (Signed)
 Early 1 hour GTT elevated. Order placed for a early 3 hour GTT for patient to complete.  Pt is currently pregnant with Di-Di twins. HCT 31. RX for iron tablets provided to patient today. Message routed to [facility] nursing staff to contact the patient and notify her of this information, review 3 hour GTT instructions and [facility] lab locations including weekend lab locations so patient can complete this testing.     1. Elevated glucose tolerance test (Primary)    - Glucose tolerance, 3 hours; Future    2. Anemia during pregnancy in second trimester    - ferrous sulfate 325 (65 FE) mg tablet; Take 1 tablet (325 mg total) by mouth daily (with breakfast) for anemia in pregnancy.  Dispense: 90 tablet; Refill: 1     Honor Junes, NP

## 2024-03-01 ENCOUNTER — Telehealth: Payer: Self-pay

## 2024-03-01 ENCOUNTER — Other Ambulatory Visit: Payer: Self-pay

## 2024-03-01 LAB — PRENATAL PROFILE
Eos # K/uL: 0.2 10*3/uL (ref 0.0–0.5)
HBV S Ag: NEGATIVE
Hemoglobin: 31 g/dL — ABNORMAL LOW (ref 34–49)
IMM Granulocytes #: 3.7 10*3/uL — ABNORMAL HIGH (ref 0.0–0.0)
IMM Granulocytes: 3.7 %
MCV: 98 fL — ABNORMAL LOW (ref 75–49)
Mono # K/uL: 0.5 10*3/uL (ref 0.1–1.0)
Mono # K/uL: 1.2 10*3/uL (ref 1.0–5.0)
Neut # K/uL: 5.6 10*3/uL (ref 1.5–6.5)
Nucl RBC # K/uL: 0.3 10*3/uL — ABNORMAL HIGH (ref 0.0–0.0)
Nucl RBC %: 0 10*3/uL (ref 0.0–0.2)
Nucl RBC %: 0 10*3/uL (ref 0.0–0.2)
RBC: 10.1 g/dL — ABNORMAL LOW (ref 4.0–5.5)
RDW: 238 10*3/uL (ref 150–450)
RDW: 98 fL (ref 75–15.0)
Seg Neut %: 238 10*3/uL (ref 150–450)
Seg Neut %: 72.7 10*3/uL (ref 1.5–6.5)
Syphilis Screen: NEGATIVE
Syphilis Status: NONREACTIVE
WBC: 7.7 10*3/uL (ref 3.5–11.0)
WBC: 7.7 10*3/uL — ABNORMAL LOW (ref 4.0–5.5)

## 2024-03-01 LAB — VARICELLA ZOSTER IGG AB: VZV IgG: 4.2

## 2024-03-01 LAB — LEAD, BLOOD

## 2024-03-01 LAB — HEMOGLOBIN A1C: Hemoglobin A1C: 4.6 %

## 2024-03-01 LAB — HIV-1/2  ANTIGEN/ANTIBODY SCREEN WITH CONFIRMATION: HIV 1&2 ANTIGEN/ANTIBODY: NONREACTIVE

## 2024-03-01 LAB — LEAD VENOUS: Lead,Venous: <1 ug/dL (ref 0.0–4.9)

## 2024-03-01 LAB — HEPATITIS C VIRUS ANTIBODY WITH REFLEX TO HEPATITIS C QUANTITATIVE: Hep C Ab: NEGATIVE

## 2024-03-01 NOTE — Telephone Encounter (Signed)
 [employer] Worker (CHW) text patient on  to 03-01-24.  Writer text pt to confirm our in person visit today to drop off the welcome bag.   Pt did not respond. Writer will reschedule.        Jen Benedict  Baby love Program/ [facility] Family Nurse, adult  [facility]  [address] [facility] office  [phone]

## 2024-03-02 ENCOUNTER — Telehealth: Payer: Self-pay

## 2024-03-02 DIAGNOSIS — Z862 Personal history of diseases of the blood and blood-forming organs and certain disorders involving the immune mechanism: Secondary | ICD-10-CM | POA: Insufficient documentation

## 2024-03-02 DIAGNOSIS — O09529 Supervision of elderly multigravida, unspecified trimester: Secondary | ICD-10-CM | POA: Insufficient documentation

## 2024-03-02 LAB — HEMOGLOBIN ELECTROPHORESIS
HGB F: 1.8 % (ref 0.0–1.9)
Hgb A1: 56 % — ABNORMAL LOW (ref 96.8–97.8)
Hgb A2: 2.6 % (ref 2.2–3.2)
Hgb S: 39.6 %
Interp,HBE: ABNORMAL

## 2024-03-02 LAB — HGB ELECT,REVIEW

## 2024-03-02 NOTE — Telephone Encounter (Signed)
 Writer contacted pt this evening to review results of sickle cell carrier screening. Pt is positive carrier of sickle cell trait.   Recommend referral to [facility] Reproductive Genetics team for patients who test positive for sickle cell trait.  Pt agreeable to referral to [facility] Reproductive Genetics team.  Pt is aware that [facility] Reproductive Genetics staff will contact her this week to schedule intake appointment.     1. History of sickle cell trait (Primary)    - AMB REFERRAL TO OBSTETRICS & GYNECOLOGY - [facility]       Honor Junes, NP

## 2024-03-03 NOTE — Progress Notes (Signed)
 [facility] Doula Referral Form  [address], [address], [place] [address]    Phone: [phone]  Fax: [phone]  Email referral to: [email or web address]    Referral Date:      Source of Referral  Agency/Contact:   Phone number:   OB Office: [facility] [address]  Phone: [phone]  Fax:[phone]      Client Information  Name: Madison Rose  DOB: 1991/11/30  Address: [address]  Phone Number:  [phone]  Email:   Insurance type: BcBs    Current Pregnancy Information  Gestational Age:1 weeks 4 days  Delivering Hospital: [facility]; yes   Estimated Date of Delivery:  07-22-24  Gravida:1  Current pregnancy related health concerns/High Risk? None    Previous Pregnancy related health concern/issues:no  Any additional information: Desires connection to doula services. no

## 2024-03-04 ENCOUNTER — Encounter: Payer: Self-pay | Admitting: MS"

## 2024-03-04 ENCOUNTER — Other Ambulatory Visit: Payer: Self-pay | Admitting: Obstetrics and Gynecology

## 2024-03-04 DIAGNOSIS — Z3490 Encounter for supervision of normal pregnancy, unspecified, unspecified trimester: Secondary | ICD-10-CM

## 2024-03-04 LAB — VERIFI PRENATAL TEST

## 2024-03-07 ENCOUNTER — Other Ambulatory Visit: Payer: Self-pay | Admitting: Obstetrics and Gynecology

## 2024-03-07 ENCOUNTER — Ambulatory Visit: Payer: MEDICAID | Attending: Obstetrics and Gynecology

## 2024-03-07 DIAGNOSIS — Z3490 Encounter for supervision of normal pregnancy, unspecified, unspecified trimester: Secondary | ICD-10-CM

## 2024-03-07 DIAGNOSIS — D251 Intramural leiomyoma of uterus: Secondary | ICD-10-CM

## 2024-03-07 DIAGNOSIS — O30042 Twin pregnancy, dichorionic/diamniotic, second trimester: Secondary | ICD-10-CM

## 2024-03-07 DIAGNOSIS — E669 Obesity, unspecified: Secondary | ICD-10-CM

## 2024-03-07 DIAGNOSIS — Z3A20 20 weeks gestation of pregnancy: Secondary | ICD-10-CM

## 2024-03-07 DIAGNOSIS — O34211 Maternal care for low transverse scar from previous cesarean delivery: Secondary | ICD-10-CM

## 2024-03-07 DIAGNOSIS — O99212 Obesity complicating pregnancy, second trimester: Secondary | ICD-10-CM

## 2024-03-07 DIAGNOSIS — Z3A01 Less than 8 weeks gestation of pregnancy: Secondary | ICD-10-CM | POA: Insufficient documentation

## 2024-03-08 ENCOUNTER — Ambulatory Visit: Payer: BLUE CROSS/BLUE SHIELD

## 2024-03-08 LAB — CYSTIC FIBROSIS EXPANDED VARIANT PANEL
CF Expanded Variant Panel Interp: 0
Cystic Fibrosis, Allele 1: NEGATIVE
Cystic Fibrosis, Allele 2: NEGATIVE

## 2024-03-10 ENCOUNTER — Encounter: Payer: Self-pay | Admitting: Obstetrics & Gynecology

## 2024-03-10 ENCOUNTER — Telehealth: Payer: Self-pay

## 2024-03-10 ENCOUNTER — Observation Stay
Admission: AD | Admit: 2024-03-10 | Discharge: 2024-03-10 | Disposition: A | Discharge status: Home / Self Care | Payer: MEDICAID | Source: Ambulatory Visit | Attending: Certified Nurse Midwife | Admitting: Certified Nurse Midwife

## 2024-03-10 DIAGNOSIS — O99891 Other specified diseases and conditions complicating pregnancy: Principal | ICD-10-CM | POA: Insufficient documentation

## 2024-03-10 DIAGNOSIS — R109 Unspecified abdominal pain: Secondary | ICD-10-CM

## 2024-03-10 DIAGNOSIS — O98812 Other maternal infectious and parasitic diseases complicating pregnancy, second trimester: Secondary | ICD-10-CM | POA: Insufficient documentation

## 2024-03-10 DIAGNOSIS — Z3A20 20 weeks gestation of pregnancy: Secondary | ICD-10-CM | POA: Insufficient documentation

## 2024-03-10 DIAGNOSIS — R12 Heartburn: Secondary | ICD-10-CM | POA: Insufficient documentation

## 2024-03-10 DIAGNOSIS — B3731 Acute candidiasis of vulva and vagina: Secondary | ICD-10-CM | POA: Insufficient documentation

## 2024-03-10 DIAGNOSIS — O26892 Other specified pregnancy related conditions, second trimester: Secondary | ICD-10-CM

## 2024-03-10 DIAGNOSIS — O26899 Other specified pregnancy related conditions, unspecified trimester: Principal | ICD-10-CM | POA: Diagnosis present

## 2024-03-10 DIAGNOSIS — R1032 Left lower quadrant pain: Secondary | ICD-10-CM | POA: Insufficient documentation

## 2024-03-10 DIAGNOSIS — O30042 Twin pregnancy, dichorionic/diamniotic, second trimester: Secondary | ICD-10-CM | POA: Insufficient documentation

## 2024-03-10 DIAGNOSIS — M62830 Muscle spasm of back: Secondary | ICD-10-CM | POA: Insufficient documentation

## 2024-03-10 LAB — URINALYSIS WITH REFLEX TO MICROSCOPIC
Blood,UA: NEGATIVE
Glucose,UA: NEGATIVE
Ketones, UA: NEGATIVE
Nitrite,UA: NEGATIVE
Protein,UA: NEGATIVE
Specific Gravity,UA: 1.014 (ref 1.002–1.030)
pH,UA: 6.5 (ref 5.0–8.0)

## 2024-03-10 LAB — SPINAL MUSCULAR ATROPHY COPY NUMBER ANALYSIS
SMA Copy Number, SMN1 Copies: 2
SMA Copy Number, SMN2 Copies: 3

## 2024-03-10 LAB — URINE MICROSCOPIC (IQ200): Hyaline Casts,UA: NONE SEEN /LPF (ref 0–5)

## 2024-03-10 MED ORDER — FAMOTIDINE 20 MG PO TABS *I*
20.0000 mg | ORAL_TABLET | Freq: Two times a day (BID) | ORAL | Status: AC
Start: 2024-03-10 — End: 2024-04-09

## 2024-03-10 MED ORDER — FLUCONAZOLE 150 MG PO TABS *I*
150.0000 mg | ORAL_TABLET | Freq: Once | ORAL | Status: AC
Start: 2024-03-10 — End: 2024-03-10
  Administered 2024-03-10: 150 mg via ORAL
  Filled 2024-03-10: qty 1

## 2024-03-10 NOTE — OB Triage Note (Signed)
 OBSTETRICS TRIAGE NOTE      Chief Complaint: cramping abdominal pain and vaginal irritation with change in discharge    HPI     Madison Rose is a 33 y.o. Z6X0960 di-di tin pregnancy at [redacted]w[redacted]d  with pregnancy complicated by risks outlined below who presents with report of left lower quadrant abdominal pain. Pain is described as cramping and is localized to the LLQ of her abdomen. It does not radiate. It started last night and did not improve with Tylenol or rest. Pain is associated with white vaginal discharge and mild odor. Madison Rose admits to fatigue (which she attributes to anemia in pregnancy- is taking iron), but denies fevers, chills, sore throat, cough, runny nose, rash, shortness of breath, chest discomfort, headache, changes in vision, vomiting, or constipation. She admits to single episode of loose stool last week. Also having heartburn unresolved w/ Tums.    UC 3/12: Klebsiella sensitive to ceftriaxone, macrobid and bactrim (resistant to ampicillin) - Tx w/ macrobid on 3/14-3/19     BV noted 2/24    She reports that she finished her macrobid as prescribed on 03/02/24. She also reports finishing metronidazole for BV. She feels both babies moving.    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   Pt states she is a carrier of sickle cell trait. States the FOB is not.    G7 P1051 is correct, does not wish to add dates of prior TABs/SABs         PMH, PSH, SH, GYN History, Allergies, and Current Medications reviewed and updated in eRecord.     Past Medical History:   Diagnosis Date    Anemia     History of blood transfusion     per patient with delivery of her daughter in 2009    Migraines     Sickle cell anemia     Sickle cell trait per patient     Past Surgical History:   Procedure Laterality Date    CESAREAN SECTION, UNSPECIFIED  10/20/2008  COSMETIC SURGERY  2021    abdominoplasty- [place]     Social History[1]  Sexually Active: YesPartner(s):   Female   Birth-Control/Protection:    Menstrual Status: Premenopausal    Pap History: Denies history of abnormal    HPV Vaccine Series: s/p 1 shot     Allergies[2]  Prior to Admission medications    Medication Sig Start Date End Date Taking? Authorizing Provider   ferrous sulfate 325 (65 FE) mg tablet Take 1 tablet (325 mg total) by mouth daily (with breakfast) for anemia in pregnancy. 02/29/24 08/27/24  Honor Junes, NP   cyclobenzaprine (FLEXERIL) 5 mg tablet Take 1 tablet (5 mg total) by mouth 3 times daily as needed for Muscle spasms. 02/24/24 03/25/24  Tami Lin, MD   prenatal multivitamin (PRENAVITE) tablet Take 1 tablet by mouth daily for Pregnancy, Please provide patient with whatever PNV insurance will cover.. 02/09/24 08/07/24  Honor Junes, NP   aspirin 81 mg EC tablet Take 2 tablets (162 mg total) by mouth daily for AMA, preeclampsia prevention. 02/08/24 08/06/24  Honor Junes, NP   folic acid (FOLVITE) 400 mcg tablet Take 2 tablets (800 mcg total) by mouth daily.    [provider]   Prenatal Vit-Fe Fumarate-FA (PRENATAL PO) Take by mouth.    [provider]   metoclopramide  (REGLAN) 10 mg tablet Take 1 tablet (10 mg total) by mouth 4 times daily (before meals and nightly) for Migraine Headache. 12/23/23 01/22/24  Honor Junes, NP   norelgestromin-ethinyl estradiol [employer]) 150-35 MCG/24HR patch Apply 1 patch onto the skin once a week. 07/24/22 12/04/22     acetaminophen (TYLENOL) 500 mg tablet Take 1 tablet by mouth every 6 hours as needed for pain. 08/08/21            Review of Systems     ROS neg except for HPI above    Prenatal Labs     No results for input(s): "WBC", "HGB", "HCT", "PLT" in the last 168 hours.  No results for input(s): "NA", "K", "CL", "CO2", "UN", "CREAT", "GFRC", "GLU" in the last 168 hours. No results for input(s): "LD", "URIC", "ALT", "AST", "ALK", "TB" in the last 168 hours.   No results for input(s): "UTPR", "UCRR" in the last 168 hours.  Urine spot P/C ratio:      Lab results: 02/08/24  1038   TP Creatinine ratio,UR 0.07             Lab results: 02/29/24  1158 02/08/24  1353   ABO RH Blood Type O RH POS  --    Rubella IgG AB 0.8  --    Syphilis Screen Neg  --    HIV 1&2 ANTIGEN/ANTIBODY Nonreactive  --    HBV S Ag NEG  --    N. gonorrhoeae NAAT (PCR)  --  NEGATIVE   Chlamydia NAAT (PCR)  --  NEGATIVE        Lab results: 02/29/24  1158   Glucose,50gm 1HR 144*      No results for input(s): "GL0", "GL1H", "GL2H", "GL3H" in the last 8760 hours.        Physical Exam     Vitals:    03/10/24 0910 03/10/24 0911   BP:  133/57   Pulse: 93    Resp: 18    Temp: 36.8 C (98.2 F)    TempSrc: Temporal    SpO2: 98%    Weight: 113.4 kg (250 lb)  Height: 1.778 m (5\' 10" )      Chaperone offered but declined by Madison Rose    General Appearance: healthy, no distress, over weight, and mildly fatigued appearing  Mental Status: Alert and Oriented  Mood/Affect: Mood  conversational, intermittently smiles.  Skin: color normal, vascularity normal, no evidence of bleeding or bruising, no lesions noted, temperature normal, texture normal, and mobility and turgor normal  HEENT:  Normocephalic, atraumatic.  Cardiovascular: Regular rate and rhythm with no murmurs  Respiratory: Clear to auscultate bilaterally, good airflow to bases, no notable dyspnea.  Abdomen: Obese, no obvious signs of trauma. Tympanic over bilateral lower quads L>R.Soft, gravid.  No rebound or guarding. Report of pain is noted with light and deep palpation at LLQ. No RLQ tenderness. No tenderness at the liver edge.  Uterus: Gravid.  Size c/w twins. Non-tender to palpation.  Neurological:  Normal speech, gait, mentation, and gaze.  Musculoskeletal:No CVA tenderness. L thoraco-lumbar spasm noted.  Extremities: normal , trace edema to bilat lower extremities, DP & PT pulses 2+, symmetric mvmt of extremities x4      External Genitalia: Unremarkable external genitalia without lesions.  Normal appearing labia majora/minora and introitus. Mild suprapubic tenderness on palpation, mild tenderness on palpation over symphysis pubis- overlying skin is normal in appearance. Shotty bilateral inguinal /femoral lymphadenopathy with no increased warmth or redness. Perineum is dry.  Vagina: Hyperemic, ruggated vaginal mucosa without lesions. Some discomfort noted w/ speculum exam. Moderate amount of thick white non-malodorous discharge.  No watery discharge, no bleeding.  Cervix: Cervical examination as per below.  No lesions.  No leakage of fluid on valsalva.    Cervical Examination: closed/non-effaced/high/posterior/firm.    Sterile Speculum Exam:  Pooling: negative  Nitrazine: negative  Wet Prep: pH 4.5. Pos for: yeast and leuks. Neg for: clue, trich, amine odor.    Fetal evaluation:   A- breech FHR 152  B- breech FHR 147      Assessment & Plan     Madison Rose is a 33 y.o. Z6X0960 at [redacted]w[redacted]d with pregnancy complicated by risks outlined above who presents with LLQ abdominal pain and heartburn.    Pain is likely due to yeast vaginitis. Discussed treatment options- Madison Rose preferred single dose diflucan and was treated in triage today. She  declined g/c culture. Cervix is closed. UA does not suggest cystitis. Urine culture is pending. I will follow.    Can start famotidine OTC for heartburn.  Continue flexeril for back muscle spasm.    Dx, Tx, warning signs, and comfort measures reviewed.  Seek care if warning signs are present.  Next OB visit is 03/14/24.    Discharged home.  Donnalee Curry, CNM FNP-BC  03/10/2024  11:31 AM           [1]   Social History  Socioeconomic History    Marital status: Single    Highest education level: GED or equivalent   Tobacco Use    Smoking status: Never     Passive exposure: Never   Substance and Sexual Activity    Alcohol use: Never    Drug use: Never    Sexual activity: Yes     Partners: Male   [2]   Allergies  Allergen Reactions    Ak-Mycin [Erythromycin] Swelling     All "mycins"- throat swelling and hard to breath    Elemental Sulfur Hives    Clindamycin Other (See Comments)     unknown    Clindamycin/Lincomycin Other (See Comments) and Hives  unknown

## 2024-03-10 NOTE — Discharge Instructions (Addendum)
 [facility]   [place] Triage Discharge Instructions    You were seen in triage for:  left lower abdominal pain. Evaluation of both you and the babies is reassuring. It appears that your urinary tract infection was well treated with macrobid although the final urine culture for confirmation is still pending. Your pain is most likely due to the yeast infection that we diagnosed and treated today. Other common causes for lower abdominal pain in pregnancy are round ligament pain, constipation, and pelvic dysfunction/symphysis pubis dysfunction. I attached information regarding round ligament pain to your discharge instructions. You may use a warm compress, Tylenol as directed on the bottle, gentle support. You may use the flexeril previously prescribed for the back muscle spasm you are experiencing. You may start famotidine twice daily for your heartburn. Seek care if symptoms worsen or if you have any of the warning signs below.        You are being discharged to: Home  The following tests are still pending: Urine culture    You may resume your normal activity and diet- minimize intake of juice, soda and sugary items like candy/cake, fruit for the next couple of days.    You should follow up with your primary obstetric provider.       Call your primary obstetric provider or return to triage if you experience the following:  1. Worsening of your symptoms  2. Vaginal bleeding  3. Leakage of fluids from your vagina  4. Severe headache, chest pain, difficulty breathing  5. Severe abdominal pain, especially if on the right side      If you cannot reach your obstetric provider, call 911 if it is an emergency.

## 2024-03-10 NOTE — Telephone Encounter (Signed)
 [employer] Worker (CHW) called patient on 03-01-24 to check to see if it was okay for me to drop off the welcome folder ( scheduled visit).  Writer received no response. Writer will reach back out to set up a new date.          Zaeden Lastinger  Baby love Program/ [facility] Family Nurse, adult  [facility]  [address] [facility] office  [phone]

## 2024-03-11 ENCOUNTER — Telehealth: Payer: Self-pay

## 2024-03-11 DIAGNOSIS — Z148 Genetic carrier of other disease: Secondary | ICD-10-CM

## 2024-03-11 NOTE — Telephone Encounter (Signed)
 Writer called pt today to review that SMA results showed she could be a carrier of this genetic disease.   Reviewed that if any carrier screening tests are positive, referral to [facility] Reproductive Genetics Team is suggested.   Reviewed that [name] Reproductive Genetics staff will contact her to schedule intake appointment.     1. 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

## 2024-03-12 LAB — AEROBIC CULTURE

## 2024-03-14 ENCOUNTER — Ambulatory Visit: Payer: MEDICAID | Attending: Obstetrics and Gynecology

## 2024-03-14 DIAGNOSIS — O99212 Obesity complicating pregnancy, second trimester: Secondary | ICD-10-CM

## 2024-03-14 DIAGNOSIS — E669 Obesity, unspecified: Secondary | ICD-10-CM

## 2024-03-14 DIAGNOSIS — Z3A21 21 weeks gestation of pregnancy: Secondary | ICD-10-CM

## 2024-03-14 DIAGNOSIS — Z362 Encounter for other antenatal screening follow-up: Secondary | ICD-10-CM | POA: Insufficient documentation

## 2024-03-14 DIAGNOSIS — O30042 Twin pregnancy, dichorionic/diamniotic, second trimester: Secondary | ICD-10-CM

## 2024-03-14 DIAGNOSIS — Z3490 Encounter for supervision of normal pregnancy, unspecified, unspecified trimester: Secondary | ICD-10-CM | POA: Insufficient documentation

## 2024-03-14 DIAGNOSIS — O34211 Maternal care for low transverse scar from previous cesarean delivery: Secondary | ICD-10-CM

## 2024-03-14 DIAGNOSIS — D251 Intramural leiomyoma of uterus: Secondary | ICD-10-CM

## 2024-03-15 ENCOUNTER — Other Ambulatory Visit: Payer: Self-pay

## 2024-03-15 ENCOUNTER — Ambulatory Visit: Payer: MEDICAID

## 2024-03-15 DIAGNOSIS — F419 Anxiety disorder, unspecified: Secondary | ICD-10-CM

## 2024-03-15 DIAGNOSIS — O09522 Supervision of elderly multigravida, second trimester: Secondary | ICD-10-CM

## 2024-03-15 DIAGNOSIS — O30042 Twin pregnancy, dichorionic/diamniotic, second trimester: Secondary | ICD-10-CM

## 2024-03-15 NOTE — Progress Notes (Signed)
 Behavioral Health Collaborative Care -  Patient Outreach     Outreach Type:  Telephone call    Reason for Outreach:  CoCM     Session Content:  Writer called and spoke to [place]berry who reports she is doing well. She reports she has been managing her back pain and she mentally feels okay. She was unable to have session, was busy and reports she would call writer back.     Time Spent:   Time spent with patient (Minutes): 5  Time Spent Performing Chart Review (Minutes): 5  Total Time (Minutes): 10    Arva Chafe

## 2024-03-15 NOTE — Progress Notes (Signed)
 Behavioral Health Collaborative Care -  Billing Encounter     Date Range: 02/2024    Insurance:   [employer] [place]        02/08/2024     9:46 AM   PHQ   PHQ Total Score 3        Patient-reported         02/08/2024     9:43 AM   GAD-7 Dates   Total Score 1        Patient-reported       Total Billable Minutes: 30    Date of Last Face to Face Contact with Licensed Provider: 03/10/2024    Retainage: Vernell Barrier

## 2024-03-18 ENCOUNTER — Ambulatory Visit: Payer: MEDICAID

## 2024-03-21 ENCOUNTER — Telehealth: Payer: Self-pay

## 2024-03-21 NOTE — Telephone Encounter (Signed)
 [employer] Worker (CHW) called patient on 03-21-24 .  Pt was enrolled in baby love on 02-24-24. Writer set up a home visit to drop  off the  welcome folder. Pt and writer did not meet that day due to no response when writer reached out to confirm.     Writer reached out today by phone and left a voicemail message for pt to give writer a call back.        Demitri Kucinski  Baby love Program/ [facility] Family Nurse, adult  [employer] [facility]  [address] [facility] office  [phone]

## 2024-03-22 ENCOUNTER — Other Ambulatory Visit: Payer: Self-pay

## 2024-03-22 ENCOUNTER — Other Ambulatory Visit
Admission: RE | Admit: 2024-03-22 | Discharge: 2024-03-22 | Disposition: A | Discharge status: Home / Self Care | Payer: MEDICAID | Source: Ambulatory Visit | Attending: Family Medicine | Admitting: Family Medicine

## 2024-03-22 DIAGNOSIS — R300 Dysuria: Secondary | ICD-10-CM

## 2024-03-22 DIAGNOSIS — Z711 Person with feared health complaint in whom no diagnosis is made: Secondary | ICD-10-CM | POA: Insufficient documentation

## 2024-03-22 DIAGNOSIS — N760 Acute vaginitis: Secondary | ICD-10-CM

## 2024-03-22 DIAGNOSIS — Z0489 Encounter for examination and observation for other specified reasons: Secondary | ICD-10-CM | POA: Insufficient documentation

## 2024-03-22 DIAGNOSIS — Z7251 High risk heterosexual behavior: Secondary | ICD-10-CM | POA: Insufficient documentation

## 2024-03-22 MED ORDER — NITROFURANTOIN MONOHYD MACRO 100 MG PO CAPS *I*
100.0000 mg | ORAL_CAPSULE | Freq: Two times a day (BID) | ORAL | 0 refills | Status: AC
Start: 2024-03-22 — End: 2024-03-29
  Filled 2024-03-22: qty 14, 7d supply, fill #0

## 2024-03-22 MED ORDER — CLOTRIMAZOLE 1 % VA CREA *I*
1.0000 | TOPICAL_CREAM | Freq: Every evening | VAGINAL | 0 refills | Status: DC
  Filled 2024-03-22: qty 45, 7d supply, fill #0

## 2024-03-23 LAB — TRICHOMONAS NAAT: Trichomonas NAAT: NEGATIVE

## 2024-03-23 LAB — CHLAMYDIA NAAT (PCR): Chlamydia NAAT (PCR): NEGATIVE

## 2024-03-23 LAB — VAGINITIS SCREEN: DNA PROBE: Vaginitis Screen:DNA Probe: 0

## 2024-03-23 LAB — N. GONORRHOEAE NAAT (PCR): N. gonorrhoeae NAAT (PCR): NEGATIVE

## 2024-03-24 ENCOUNTER — Other Ambulatory Visit: Payer: Self-pay

## 2024-03-24 LAB — AEROBIC CULTURE: Aerobic Culture: 0 — AB

## 2024-03-24 MED ORDER — CEFPODOXIME PROXETIL 200 MG PO TABS *I*
ORAL_TABLET | ORAL | 0 refills | Status: DC
Start: 2024-03-24 — End: 2024-04-13
  Filled 2024-03-24: qty 14, 7d supply, fill #0

## 2024-03-25 ENCOUNTER — Observation Stay
Admission: AD | Admit: 2024-03-25 | Discharge: 2024-03-25 | Disposition: A | Discharge status: Home / Self Care | Payer: MEDICAID | Source: Ambulatory Visit | Attending: Obstetrics & Gynecology | Admitting: Obstetrics & Gynecology

## 2024-03-25 ENCOUNTER — Other Ambulatory Visit: Payer: Self-pay

## 2024-03-25 ENCOUNTER — Telehealth: Payer: Self-pay

## 2024-03-25 DIAGNOSIS — O26892 Other specified pregnancy related conditions, second trimester: Principal | ICD-10-CM

## 2024-03-25 DIAGNOSIS — R109 Unspecified abdominal pain: Principal | ICD-10-CM

## 2024-03-25 DIAGNOSIS — O30002 Twin pregnancy, unspecified number of placenta and unspecified number of amniotic sacs, second trimester: Secondary | ICD-10-CM

## 2024-03-25 DIAGNOSIS — B961 Klebsiella pneumoniae [K. pneumoniae] as the cause of diseases classified elsewhere: Secondary | ICD-10-CM

## 2024-03-25 DIAGNOSIS — Z113 Encounter for screening for infections with a predominantly sexual mode of transmission: Secondary | ICD-10-CM | POA: Insufficient documentation

## 2024-03-25 DIAGNOSIS — O30042 Twin pregnancy, dichorionic/diamniotic, second trimester: Secondary | ICD-10-CM

## 2024-03-25 DIAGNOSIS — O99891 Other specified diseases and conditions complicating pregnancy: Secondary | ICD-10-CM

## 2024-03-25 DIAGNOSIS — Z3A23 23 weeks gestation of pregnancy: Secondary | ICD-10-CM

## 2024-03-25 DIAGNOSIS — O2342 Unspecified infection of urinary tract in pregnancy, second trimester: Secondary | ICD-10-CM

## 2024-03-25 MED ORDER — CYCLOBENZAPRINE HCL 5 MG PO TABS *I*
5.0000 mg | ORAL_TABLET | Freq: Once | ORAL | Status: AC
Start: 2024-03-25 — End: 2024-03-25
  Administered 2024-03-25: 5 mg via ORAL
  Filled 2024-03-25: qty 1

## 2024-03-25 MED ORDER — COMFORT FIT MATERNITY SUPP MED MISC *A*: 0 refills | Status: DC

## 2024-03-25 MED ORDER — CYCLOBENZAPRINE HCL 10 MG PO TABS *I*
10.0000 mg | ORAL_TABLET | Freq: Three times a day (TID) | ORAL | 0 refills | Status: AC | PRN
Start: 2024-03-25 — End: 2024-04-24
  Filled 2024-03-25: qty 30, 10d supply, fill #0

## 2024-03-25 MED ORDER — ACETAMINOPHEN 500 MG PO TABS *I*
1000.0000 mg | ORAL_TABLET | Freq: Once | ORAL | Status: AC
Start: 2024-03-25 — End: 2024-03-25
  Administered 2024-03-25: 1000 mg via ORAL
  Filled 2024-03-25: qty 2

## 2024-03-25 NOTE — Telephone Encounter (Signed)
 Called patient to follow up. She reports that she is still not feeling well and is still having the abdominal/back pain. She denies VB. Advised patient to report to [facility] triage for evaluation. She reports that she is pulling into the hospital now.    OB triage notified.

## 2024-03-25 NOTE — Procedures (Cosign Needed)
 Fetal Non-Stress Test    Patient: Madison Rose Age: 33 y.o.  Date of Birth: 10-16-1991  [id number]  eMRN: [id number]  GA: [redacted]w[redacted]d  Maternal  Heart Rate: 101 (03/25/24 1719)  BP: 121/71 (03/25/24 1719)    Reason For Exam: Triage NST  Education Done:  External fetal monitoring NST    Fetal non-stress test for twin pregnancy  Pre Procedure Diagnosis: Supervision of normal pregnancy  Post Procedure Diagnosis: NST (non-stress test) reactive            NST Start Time:  03/25/2024 7:45 PM            Uterine Irritability: No            Contractions: none    NST Fetus A  Variability: moderate  FHR Baseline: 145 GA appropriate variable decels per Delberta Fee MD  Decelerations: variable  Accelerations: at least 10 BPM for 10 seconds  Stimulation: none  NST Interpretation: reactive    NST Fetus B  Variability: moderate  FHR Baseline: 150 GA appropriate variable decels per Delberta Fee MD  Decelerations: variable  Accelerations: at least 10 BPM for 10 seconds  Stimulation: none  NST Interpretation: reactive    End Time:  03/25/2024 8:05 PM          Duration of test (min): 20  Location of NST Fetal Heart Tracing: Archived electronically  Reviewed with:  Delberta Fee, MD      Test performed By: Warden Ha, RN  03/25/2024 10:12 PM

## 2024-03-25 NOTE — OB Triage Note (Signed)
 Patient presents to triage with 23 week [facility] twins having intermittent back and abdominal pain. Fetal movemetn noted from both twins A and B. VSS. Patient denies LOF, VB and is unsure if what she is feeling is contractions. EFM placed and due to active fetal movement unable to obtain continuous at this time. MD team notified of patient arrival. Marquin Patino, RN

## 2024-03-25 NOTE — OB Triage Note (Signed)
Patient meets criteria for discharge. Paperwork sent with patient. Patient ambulatory out of triage.  Stevphen Meuse, RN

## 2024-03-25 NOTE — Telephone Encounter (Signed)
 OB patient currently 23w calling reporting lower abdominal pain and in her back that started yesterday and is worsening today. The pain happens about every hour. She denies VB, LOF, DFM, vaginal symptoms, and urinary symptoms. She is eating and drinking normally.    She has a harder time while she is at work because she feels worsening pain and sometimes episodes of dizziness. 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 She is wondering if she can be taken out of work. Advised patient that is something that would need to be determined by a provider. She was last seen in office 02/08/24. Encouraged patient to schedule and maintain routine OB appointments.    In the meantime, writer advised patient the message would be sent to the attending of the day to review and recommend a follow up plan. Call back precautions reviewed.    Patient verbalized understanding and is agreeable to the plan.

## 2024-03-25 NOTE — OB Triage Note (Signed)
 OBSTETRICS EVALUATION     Primary OB-GYN: [facility]    Chief Complaint: abdominal pain    HPI     Madison Rose is a 33 y.o. [id number] at [redacted]w[redacted]d with pregnancy complicated by risks outlined below who presents with abdominal pain. The pain is sharp in nature that occurs with movement only in her lower abdomen. The pain can occur between 1-3 hours. She has been treating the pain with her heating pad. She has not been taking any medication. She also reports having low back pain that wraps around. The pain feels better when lying down or staying still. She feels the pain has worsened over the past couple days. Of note, she has a history of an abdominoplasty with her previous delivery being 15 years ago.     Reports good FM, no vaginal bleeding, or LOF. No N/V, fever/chills, abnormal vaginal discharge, or recent intercourse. Denies any dysuria.     Patient's medications, allergies, past medical, surgical, social and family histories were recorded, reviewed and updated as appropriate.     Pregnancy Risks     Didi twins    UTI in pregnancy  - 3/12 Klebsiella positive, repeat positive on 3/27  - Tx with Macrobid (3/14-3/19)  - Started cefpodixime on 4/10    Sickle cell trait    Hx of C/s    Migraines    Hx of abdominoplasty    SMA carrier    Elevated early 1 hr GTT    Obstetrical History     OB History   Gravida Para Term Preterm AB Living   7 1 1  0 5 1   SAB IAB Ectopic Multiple Live Births   0 0 0 0 1      # Outcome Date GA Lbr Len/2nd Weight Sex Type Anes PTL Lv   7 Current            6 Term 10/20/08 [redacted]w[redacted]d  3610 g (7 lb 15.3 oz) F *CSLT Spinal N LIV      Complications: Fetal Intolerance   5 AB      TAB      4 AB      SAB      3 AB      SAB      2 AB      TAB      1 AB      TAB         Obstetric Comments   12/23/23: No carrier screening results found.   Pt states she is a carrier of sickle cell trait. States the FOB is not.    G7 P1051 is correct, does not wish to add dates of prior TABs/SABs         Prenatal Labs     No results  for input(s): "WBC", "HGB", "HCT", "PLT" in the last 168 hours.  No results for input(s): "NA", "K", "CL", "CO2", "UN", "CREAT", "GFRC", "GLU" in the last 168 hours. No results for input(s): "LD", "URIC", "ALT", "AST", "ALK", "TB" in the last 168 hours.   No results for input(s): "UTPR", "UCRR" in the last 168 hours.    Urine spot P/C ratio:      Lab results: 02/08/24  1038   TP Creatinine ratio,UR 0.07             Lab results: 03/22/24  1613 02/29/24  1158   ABO RH Blood Type  --  O RH POS   Rubella IgG AB  --  0.8  Syphilis Screen  --  Neg   HIV 1&2 ANTIGEN/ANTIBODY  --  Nonreactive   HBV S Ag  --  NEG   N. gonorrhoeae NAAT (PCR) NEGATIVE  --    Chlamydia NAAT (PCR) NEGATIVE  --         Lab results: 02/29/24  1158   Glucose,50gm 1HR 144*      No results for input(s): "GL0", "GL1H", "GL2H", "GL3H" in the last 8760 hours.        Prenatal Ultrasound Review (Pertinent Findings):  3/31 OB USN:    General Evaluation   ==============   Cardiac activity Present. FHR 154 bpm. Fetal movements: Visualized. Presentation: Vertex, Maternal left, inferior   Placenta: Placental site: Anterior     Amniotic Fluid Assessment   =====================   Amount of AF: Appears normal   MVP 4.4 cm     General Evaluation   ==============   Cardiac activity Present. FHR 149 bpm. Fetal movements: Visualized. Presentation: head maternal left, Maternal right superior   Placenta: Placental site: Posterior     Amniotic Fluid Assessment   =====================   Amount of AF: Appears normal   MVP 6.4 cm     Fetal Anatomy   ===========   The following structures appear normal:   Heart / Thorax                      Superior vena cava. Inferior vena cava.                                              Diaphragm.   Spine                                  Cervical spine. Thoracic spine. Lumbar spine. Sacral spine.     The following structures could not be adequately visualized:   Heart / Thorax                      Aortic arch view.     Fetal Anatomy    ===========   The following structures appear normal:   Heart / Thorax                      4-chamber view.     The following structures could not be adequately visualized:   Head / Neck                         Cavum septi pellucidi.   Face                                   Lips. Profile. Nose. Nasal bone. Orbits.   Heart / Thorax                      RVOT view. LVOT view. 3-vessel view. 3-vessel-trachea view. Aortic arch view. Superior vena cava. Inferior vena cava.     Impression   =========   Dichorionic-diamniotic twin pregnancy. Amniotic fluid volume is within normal limits for both fetuses. The cervical length is 3.4 cm and is within normal limits.       Physical Exam  Vitals:    03/25/24 1719   BP: 121/71   Pulse: 101   Resp: 17   Temp: 36.1 C (97 F)   TempSrc: Temporal   SpO2: 99%       General Appearance: healthy, alert, active, and no distress  Mental Status: Alert and oriented x 3  Mood/Affect: appropriate  Skin: color normal, vascularity normal, no evidence of bleeding or bruising, and no lesions noted. Tenderness noted along abdominoplasty scarring.   HEENT: Normocephalic, atraumatic.  Cardiovascular: normal rate  Respiratory: normal respiratory effort on room air  Abdomen: soft, gravid, and non-tender in upper quadrants and laterally. Tenderness noted along abdominoplasty scar and suprapubically. No CVA tenderness noted.  Uterus: Gravid. Non-tender to palpation.  Neurological: grossly normal  Extremities: normal    External Genitalia: Unremarkable external genitalia without lesions.  Normal appearing labia majora/minora and introitus.  Vagina: Pink, ruggated vaginal mucosa without lesions.  Physiologic discharge.  No bleeding.  Cervix: Closed on speculum exam.  No lesions.  No leakage of fluid.    Sterile Speculum Exam:  Pooling: negative  Ferning: negative  Nitrazine: negative  Membranes: intact    Wet Prep: , pH- normal, Clue cells- absent, Hyphae- absent, Trich- absent, White Blood Cells-  few, Lactobacilli- few    Presentation: both fetuses cephalic by ultrasound    Placental location: Baby A anterior, Baby B posterior    Fetal Monitoring (Baby A):  Baseline: 145 bpm  Variability: Moderate (6-25 BPM)  Accelerations: Yes 10X10  Decelerations: variable  Category: II  Toco: No contractions observed  Bedside DVP: 5.1 cm    Fetal Monitoring (Baby B):  Baseline: 150 bpm  Variability: moderate  Accelerations: Yes 10x10  Decelerations: variable  Category: II  Toco: no contractions observed  Bedside DVP: 7.2 cm    Overall, reassuring NST with extended monitoring. FHR tracing reviewed with Dr. Delberta Fee. Bedside DVP completed as listed above. Following extended monitoring and bedside DVP, tracing was overall reassuring.     Assessment & Plan     Madison Rose is an 33 y.o. [phone] at [redacted]w[redacted]d with pregnancy complicated by risks outlined above who presents with abdominal pain, especially with movement. Cervix closed on speculum exam with recheck declined.  No pathogens noted on wet prep. GC/CT/T pending. NST reactive and reassuring with no contractions. Concern for preterm labor at this time is low. She received flexeril, and Tylenol in OBT. Discussed that her pain seems to be mostly along her prior abdominoplasty scar and occurs mostly with movement, which suggests most likely etiology is MSK and scar tissue. Maternal support band and flexeril rx sent.     Also discussed current UTI with Klebsiella. Will continue with current prescribed therapy of cefpoxidime with need for repeat urine culture in 1-2 weeks to check for adequate treatment. No concern for pyelonephritis at this time given benign physical exam. Encouraged adequate hydration and conservative measures such as tylenol and heat packs. Discharged home with return precautions. Next prenatal appointment to be scheduled in 1-2 weeks.    D/w Dr. Carloyn Chi Dis    Marnette Sinner, MD  Obstetrics & Gynecology PGY-2  Pager #: [phone]

## 2024-03-25 NOTE — Discharge Instructions (Addendum)
 Reason for Triage Visit: abdominal pain  Destination: Home  Mode: Ambulatory    Procedures done in triage: Fetal monitoring  Pending Laboratory Data: Vaginal culture (s)    Diet: Regular    Activity: Regular activity    Special Instructions: Call or return to triage if you have worsening of any of your symptoms, if you have more than 6 contractions in an hour, if you have vaginal bleeding or leakage of clear fluid, if you have headache or vision changes, if you have right-sided pain, or if you don't feel the baby moving.      Please continue with your antibiotic (cefpoxidime) prescription for the full course for your UTI. Please schedule an appointment and have a repeat urine culture completed at that time. You can take tylenol and flexeril as needed for the pain. If it increases, please call your OBGYN.     If you cannot reach your OB/Gyn or Midwife, call the Labor and Delivery Unit or 911 if it is an emergency.    Follow-up with your [facility] provider as previously instructed.

## 2024-03-26 ENCOUNTER — Other Ambulatory Visit: Payer: Self-pay

## 2024-03-26 LAB — N. GONORRHOEAE NAAT (PCR): N. gonorrhoeae NAAT (PCR): NEGATIVE

## 2024-03-26 LAB — TRICHOMONAS NAAT: Trichomonas NAAT: NEGATIVE

## 2024-03-26 LAB — CHLAMYDIA NAAT (PCR): Chlamydia NAAT (PCR): NEGATIVE

## 2024-03-26 LAB — VAGINITIS SCREEN: DNA PROBE: Vaginitis Screen:DNA Probe: 0

## 2024-03-29 ENCOUNTER — Other Ambulatory Visit: Payer: Self-pay

## 2024-03-31 ENCOUNTER — Other Ambulatory Visit: Payer: Self-pay

## 2024-04-05 ENCOUNTER — Other Ambulatory Visit: Payer: Self-pay

## 2024-04-05 NOTE — Progress Notes (Signed)
 Closing Note:        Investment banker, operational (CHW) is closing pt case due to lack of contact. Writer has reached out several times after pt was enrolled in the program on 02-24-24. Writer was scheduled to meet with pt to drop off the [employer] on 03-02-23. Pt has not responded back to [employer], a My Chart message as well as text messages.        Kay Shippy  Baby love Program/ [facility] Family Nurse, adult  [employer] [facility]  [address] [facility] office  [phone]

## 2024-04-08 ENCOUNTER — Ambulatory Visit: Payer: MEDICAID | Attending: Obstetrics and Gynecology

## 2024-04-08 ENCOUNTER — Other Ambulatory Visit: Payer: Self-pay | Admitting: Obstetrics and Gynecology

## 2024-04-08 DIAGNOSIS — O34211 Maternal care for low transverse scar from previous cesarean delivery: Secondary | ICD-10-CM | POA: Insufficient documentation

## 2024-04-08 DIAGNOSIS — E669 Obesity, unspecified: Secondary | ICD-10-CM | POA: Insufficient documentation

## 2024-04-08 DIAGNOSIS — Z3490 Encounter for supervision of normal pregnancy, unspecified, unspecified trimester: Secondary | ICD-10-CM

## 2024-04-08 DIAGNOSIS — O99212 Obesity complicating pregnancy, second trimester: Secondary | ICD-10-CM | POA: Insufficient documentation

## 2024-04-08 DIAGNOSIS — O30042 Twin pregnancy, dichorionic/diamniotic, second trimester: Secondary | ICD-10-CM | POA: Insufficient documentation

## 2024-04-08 DIAGNOSIS — Z3A25 25 weeks gestation of pregnancy: Secondary | ICD-10-CM | POA: Insufficient documentation

## 2024-04-08 DIAGNOSIS — D251 Intramural leiomyoma of uterus: Secondary | ICD-10-CM | POA: Insufficient documentation

## 2024-04-12 ENCOUNTER — Observation Stay: Payer: MEDICAID

## 2024-04-12 ENCOUNTER — Telehealth: Payer: Self-pay

## 2024-04-12 ENCOUNTER — Other Ambulatory Visit: Payer: Self-pay

## 2024-04-12 ENCOUNTER — Inpatient Hospital Stay
Admission: AD | Admit: 2024-04-12 | Discharge: 2024-04-13 | DRG: 566 | Disposition: A | Discharge status: Home / Self Care | Payer: MEDICAID | Source: Ambulatory Visit | Attending: Obstetrics and Gynecology | Admitting: Obstetrics and Gynecology

## 2024-04-12 DIAGNOSIS — O99891 Other specified diseases and conditions complicating pregnancy: Secondary | ICD-10-CM

## 2024-04-12 DIAGNOSIS — Z3A25 25 weeks gestation of pregnancy: Secondary | ICD-10-CM

## 2024-04-12 DIAGNOSIS — Z3490 Encounter for supervision of normal pregnancy, unspecified, unspecified trimester: Secondary | ICD-10-CM

## 2024-04-12 DIAGNOSIS — R109 Unspecified abdominal pain: Secondary | ICD-10-CM

## 2024-04-12 DIAGNOSIS — O2302 Infections of kidney in pregnancy, second trimester: Principal | ICD-10-CM | POA: Diagnosis present

## 2024-04-12 DIAGNOSIS — Z331 Pregnant state, incidental: Secondary | ICD-10-CM

## 2024-04-12 DIAGNOSIS — O2300 Infections of kidney in pregnancy, unspecified trimester: Secondary | ICD-10-CM | POA: Insufficient documentation

## 2024-04-12 DIAGNOSIS — O34211 Maternal care for low transverse scar from previous cesarean delivery: Secondary | ICD-10-CM | POA: Diagnosis present

## 2024-04-12 DIAGNOSIS — N12 Tubulo-interstitial nephritis, not specified as acute or chronic: Secondary | ICD-10-CM

## 2024-04-12 DIAGNOSIS — O30002 Twin pregnancy, unspecified number of placenta and unspecified number of amniotic sacs, second trimester: Secondary | ICD-10-CM

## 2024-04-12 DIAGNOSIS — O23592 Infection of other part of genital tract in pregnancy, second trimester: Secondary | ICD-10-CM | POA: Diagnosis present

## 2024-04-12 DIAGNOSIS — D573 Sickle-cell trait: Secondary | ICD-10-CM | POA: Diagnosis present

## 2024-04-12 DIAGNOSIS — D649 Anemia, unspecified: Secondary | ICD-10-CM | POA: Diagnosis present

## 2024-04-12 DIAGNOSIS — O30042 Twin pregnancy, dichorionic/diamniotic, second trimester: Secondary | ICD-10-CM | POA: Diagnosis present

## 2024-04-12 DIAGNOSIS — M7989 Other specified soft tissue disorders: Secondary | ICD-10-CM

## 2024-04-12 DIAGNOSIS — O99012 Anemia complicating pregnancy, second trimester: Secondary | ICD-10-CM | POA: Diagnosis present

## 2024-04-12 DIAGNOSIS — N1330 Unspecified hydronephrosis: Secondary | ICD-10-CM

## 2024-04-12 DIAGNOSIS — Z87440 Personal history of urinary (tract) infections: Secondary | ICD-10-CM

## 2024-04-12 LAB — URINALYSIS WITH MICROSCOPIC
Blood,UA: NEGATIVE
Glucose,UA: NEGATIVE
Ketones, UA: NEGATIVE
Nitrite,UA: NEGATIVE
Protein,UA: NEGATIVE
Specific Gravity,UA: 1.017 (ref 1.002–1.030)
pH,UA: 6.5 (ref 5.0–8.0)

## 2024-04-12 LAB — TYPE AND SCREEN
ABO RH Blood Type: O POS
Antibody Screen: NEGATIVE

## 2024-04-12 LAB — BLOOD BANK HOLD PINK

## 2024-04-12 LAB — PROTIME-INR
INR: 0.9 (ref 0.9–1.1)
Protime: 10.2 s (ref 10.0–12.9)

## 2024-04-12 LAB — CBC
Hematocrit: 28 % — ABNORMAL LOW (ref 34–49)
Hemoglobin: 9.5 g/dL — ABNORMAL LOW (ref 11.2–16.0)
MCV: 95 fL (ref 75–100)
Platelets: 217 10*3/uL (ref 150–450)
RBC: 2.9 MIL/uL — ABNORMAL LOW (ref 4.0–5.5)
RDW: 13 % (ref 0.0–15.0)
WBC: 8 10*3/uL (ref 3.5–11.0)

## 2024-04-12 LAB — APTT: aPTT: 26 s (ref 25.8–37.9)

## 2024-04-12 LAB — HOLD SST

## 2024-04-12 MED ORDER — SODIUM CHLORIDE 0.9 % FLUSH FOR PUMPS *I*
0.0000 mL/h | INTRAVENOUS | Status: DC | PRN
Start: 2024-04-12 — End: 2024-04-14

## 2024-04-12 MED ORDER — ONDANSETRON HCL 2 MG/ML IV SOLN *I*
4.0000 mg | Freq: Three times a day (TID) | INTRAMUSCULAR | Status: DC | PRN
Start: 2024-04-12 — End: 2024-04-14

## 2024-04-12 MED ORDER — ASPIRIN 81 MG PO TBEC *I*
162.0000 mg | DELAYED_RELEASE_TABLET | Freq: Every day | ORAL | Status: DC
Start: 2024-04-13 — End: 2024-04-14
  Administered 2024-04-13: 08:00:00 162 mg via ORAL
  Filled 2024-04-12 (×2): qty 2

## 2024-04-12 MED ORDER — CEFTRIAXONE SODIUM 1 G IN STERILE WATER 10ML SYRINGE *I*
1000.0000 mg | INTRAVENOUS | Status: DC
Start: 2024-04-12 — End: 2024-04-14
  Administered 2024-04-12 – 2024-04-13 (×2): 1000 mg via INTRAVENOUS
  Filled 2024-04-12 (×2): qty 1000

## 2024-04-12 MED ORDER — PRENATAL PLUS IRON 29-1 MG PO TABS *I*
1.0000 | ORAL_TABLET | Freq: Every day | ORAL | Status: DC
Start: 2024-04-13 — End: 2024-04-14
  Administered 2024-04-13: 08:00:00 1 via ORAL
  Filled 2024-04-12: qty 1

## 2024-04-12 MED ORDER — FERROUS SULFATE 325 (65 FE) MG PO TABS *WRAPPED* *I*
325.0000 mg | ORAL_TABLET | Freq: Every day | ORAL | Status: DC
Start: 2024-04-13 — End: 2024-04-14
  Administered 2024-04-13: 08:00:00 325 mg via ORAL
  Filled 2024-04-12 (×2): qty 1

## 2024-04-12 MED ORDER — DIPHENHYDRAMINE HCL 25 MG ORAL SOLID *WRAPPED*
25.0000 mg | Freq: Every evening | ORAL | Status: DC | PRN
Start: 2024-04-12 — End: 2024-04-14

## 2024-04-12 MED ORDER — DEXTROSE 5 % FLUSH FOR PUMPS *I*
0.0000 mL/h | INTRAVENOUS | Status: DC | PRN
Start: 2024-04-12 — End: 2024-04-14

## 2024-04-12 MED ORDER — CYCLOBENZAPRINE HCL 10 MG PO TABS *I*
10.0000 mg | ORAL_TABLET | Freq: Three times a day (TID) | ORAL | Status: DC | PRN
Start: 2024-04-12 — End: 2024-04-14
  Administered 2024-04-13: 04:00:00 10 mg via ORAL
  Filled 2024-04-12: qty 1

## 2024-04-12 MED ORDER — ACETAMINOPHEN 500 MG PO TABS *I*
1000.0000 mg | ORAL_TABLET | Freq: Four times a day (QID) | ORAL | Status: DC | PRN
Start: 2024-04-12 — End: 2024-04-14
  Administered 2024-04-13: 04:00:00 1000 mg via ORAL
  Filled 2024-04-12: qty 2

## 2024-04-12 MED ORDER — POLYETHYLENE GLYCOL 3350 PO PACK 17 GM *I*
17.0000 g | PACK | Freq: Every day | ORAL | Status: DC | PRN
Start: 2024-04-12 — End: 2024-04-14

## 2024-04-12 MED ORDER — FAMOTIDINE 20 MG PO TABS *I*
20.0000 mg | ORAL_TABLET | Freq: Two times a day (BID) | ORAL | Status: DC
Start: 2024-04-13 — End: 2024-04-14
  Administered 2024-04-13: 08:00:00 20 mg via ORAL
  Filled 2024-04-12: qty 1

## 2024-04-12 MED ORDER — CALCIUM CARBONATE ANTACID 500 MG PO CHEW *I*
1000.0000 mg | CHEWABLE_TABLET | Freq: Three times a day (TID) | ORAL | Status: DC | PRN
Start: 2024-04-12 — End: 2024-04-14

## 2024-04-12 MED ORDER — FLUCONAZOLE 150 MG PO TABS *I*
150.0000 mg | ORAL_TABLET | Freq: Once | ORAL | Status: AC
Start: 2024-04-12 — End: 2024-04-12
  Administered 2024-04-12: 22:00:00 150 mg via ORAL
  Filled 2024-04-12: qty 1

## 2024-04-12 NOTE — H&P (Addendum)
 OBSTETRICS EVALUATION      Primary OB-GYN: [facility]    Chief Complaint: Worsening left flank pain    HPI     Madison Rose is a 33 y.o. [id number] at [redacted]w[redacted]d with pregnancy complicated by risks outlined below who presents with worsening flank pain. Patient notes that she had right back/flank pain for the past month but states that it has continued to get worse. She Tylenol  prior to arriving to the hospital but rates that pain 7-8/10 in pain severity. Of note, patient has a long-standing history of Klebsiella UTIs for which was treated with Macrobid  and Cefpodoxime . Patient reports completing the full course of both antibiotics without resolution of the infection on subsequent Urine cultures. When she has the symptoms of UTI it typically is characterized by a sulfur-smelling urine and back pain. She denies fevers, chills, chest pain, SOB, palpitations, dysuria, hematuria, nausea, vomiting, or changes in bowel habits.     Denies contractions, leakage of fluid, vaginal bleeding, recent abdominal trauma, or decreased fetal movement.     Additionally, patient notes worsening swelling of her lower extremities (left greater than right) with a new associated rash. Her legs feel more uncomfortable denies obvious calf pain. She denies changes in soaps or detergent or itching of the lower extremities. Reports family history in a blood clot in the leg of her cousin. Denies personal history of clotting or bleeding disorders. Denies recent gingival or mucosal bleeding.     Patient's medications, allergies, past medical, surgical, social and family histories were recorded, reviewed and updated as appropriate.     Pregnancy Risks     Dichorionic Diamniotic Twin Pregnancy   -Last USN 04/08/2024: Fetus A Vertex, EFW 818 grams (62%-ile), AFI WNL, placenta anterior. Fetus B Vertex, EFW 812 grams (60%-ile), AFI WNL, placenta posterior, concordant growth     AMA     UTI in pregnancy  -Klebsiella positive on 3/12, 3/27, 4/8   -s/p treatment  with  Macrobid  (3/14-3/19), s/p cefpodixime (4/10-4/17)     Sickle cell trait     Hx of C/S  -records unavailable, 2009 in P6 at 40 weeks for fetal intolerance      Migraines     Hx of abdominoplasty     SMA carrier     Elevated early 1 hr GTT    Anemia     Obstetrical History     OB History   Gravida Para Term Preterm AB Living   7 1 1  0 5 1   SAB IAB Ectopic Multiple Live Births   0 0 0 0 1      # Outcome Date GA Lbr Len/2nd Weight Sex Type Anes PTL Lv   7 Current            6 Term 10/20/08 [redacted]w[redacted]d  3610 g (7 lb 15.3 oz) F *CSLT Spinal N LIV      Complications: Fetal Intolerance   5 AB      TAB      4 AB      SAB      3 AB      SAB      2 AB      TAB      1 AB      TAB         Obstetric Comments   12/23/23: No carrier screening results found.   Pt states she is a carrier of sickle cell trait. States the FOB is not.    G7 P1051  is correct, does not wish to add dates of prior TABs/SABs         Prenatal Labs     Recent Labs   Lab 04/12/24  1952   WBC 8.0   Hemoglobin 9.5*   Hematocrit 28*   Platelets 217     No results for input(s): NA, K, CL, CO2, UN, CREAT, GFRC, GLU in the last 168 hours. No results for input(s): LD, URIC, ALT, AST, ALK, TB in the last 168 hours.   No results for input(s): UTPR, UCRR in the last 168 hours.    Urine spot P/C ratio:      Lab results: 02/08/24  1038   TP Creatinine ratio,UR 0.07             Lab results: 03/25/24  2006 03/22/24  1613 02/29/24  1158   ABO RH Blood Type  --   --  O RH POS   Rubella IgG AB  --   --  0.8   Syphilis Screen  --   --  Neg   HIV 1&2 ANTIGEN/ANTIBODY  --   --  Nonreactive   HBV S Ag  --   --  NEG   N. gonorrhoeae NAAT (PCR) NEGATIVE   < >  --    Chlamydia NAAT (PCR) NEGATIVE   < >  --     < > = values in this interval not displayed.        Lab results: 02/29/24  1158   Glucose,50gm 1HR 144*      No results for input(s): GL0, GL1H, GL2H, GL3H in the last 8760 hours.        Prenatal Ultrasound Review (Pertinent  Findings):  04/08/2024:               Physical Exam     Vitals:    04/12/24 1920 04/12/24 1945   BP: 133/57    Pulse: 106 98   Resp: 16    Temp: 36.5 C (97.7 F)    TempSrc: Temporal    SpO2: 98%        General Appearance: alert, active, and no distress  Mental Status: Alert and oriented x 3  Mood/Affect: Mood  happy  Cardiovascular: normal rate, regular rhythm  Respiratory: clear to auscultation  Abdomen: Soft, gravid, non-tender. Minimal left CVA tenderness  Uterus: Gravid. Non-tender to palpation.  Neurological:  Grossly intact  Extremities: Asymmetric swelling of lower extremities (1+), right calf 36 cm circumference, left calf 42 cm circumference. Anterior tibial surface tenderness to palpation, no calf tenderness. Diffuse petichiae and erythema noted from ankle to the knee bilaterally.  Dorsalis pedis pulses palpated bilaterally.                       External Genitalia: Unremarkable external genitalia without lesions.  Normal appearing labia majora/minora and introitus.  Vagina: Pink, ruggated vaginal mucosa without lesions.  White thick discharge.  Cervix: Cervical examination as per below.  No lesions.  No leakage of fluid. Visually closed.     Most Recent Cervical Exam: closed/thick/high, posterior    Wet Prep: , Whiff- absent, Clue cells- absent, Hyphae- present    Presentation: cephalic x2 by ultrasound    Fetal Monitoring (Fetus A):  Baseline: 135 bpm  Variability: Moderate (6-25 BPM)  Accelerations: Yes 10X10  Decelerations: None  Category: 1  Toco: No contractions observed    Fetal Monitoring (Fetus B):  Baseline: 145 bpm  Variability: Moderate (6-25 BPM)  Accelerations: Yes 10X10  Decelerations: None  Category: 1  Toco: No contractions observed    Assessment & Plan     Madison Rose is a 33 y.o. [id number] at [redacted]w[redacted]d with pregnancy complicated by risks outlined above admitted for left flank pain with concern for pyelonephritis. Vitals are within normal limits.     Antepartum admission   - Admit to  [address]   - Insert IV   - Admission labs ordered: Type and screen, CBC, Syphilis screen   - Swabs  collected: Gonorrhea, Chlamydia, Trichomas    - PPH risk assessment: Medium    Concern for Pyelonephritis ISO recurrent UTIs    -Plan for renal USN to evaluate for obstructing stone or hydronephrosis    -CBC with Diff, BMP    -Given most recent culture sensitive to Ceftriaxone , plan for IV Ceftriaxone  1000 mg every 24 hours until resolution of flank pain   -Plan for treatment with Keflex  unless culture is resistant followed by Keflex  suppression for the duration of this pregnancy     Asymmetric LLE swelling, petechiae    -Bilateral LE ultrasound ordered to rule out DVT    -PT, PTT, INR, Platelet count ordered    -Plan for compression stockings     Yeast Vaginitis    -Diflucan  150 mg x1     History of Cesarean Section    -patient undecided on delivery mode, Cesarean Section consents completed on admission     Prenatal care coordination   - Rh positive / Rubella equivocal /HIV negative / GBS not done   - Infant: female x2   - Feeding: bottle   - PPBC: undecided   - TDaP vaccine to be given around 28 weeks    Tamala JULIANNA Badger, MD   Obstetrics and Gynecology, PGY-4

## 2024-04-12 NOTE — Procedures (Addendum)
 Fetal Non-Stress Test    Patient: Madison Rose Age: 33 y.o.  Date of Birth: 22-Sep-1991  [id number]  eMRN: [id number]  GA: [redacted]w[redacted]d  Maternal  Heart Rate: 98 (04/12/24 2053)  BP: 133/57 (04/12/24 1920)    Reason For Exam:  Education Done:  External fetal monitoring NST    Fetal non-stress test for twin pregnancy  Pre Procedure Diagnosis: Flank pain  Post Procedure Diagnosis: NST (non-stress test) reactive            NST Start Time:  04/12/2024 8:30 PM            Uterine Irritability: No            Contractions: none    NST Fetus A  Variability: moderate  FHR Baseline: 135  Decelerations: none   Accelerations: at least 10 BPM for 10 seconds  Stimulation: none  NST Interpretation: reactive    NST Fetus B  Variability: moderate  FHR Baseline: 135  Decelerations: none   Accelerations: at least 10 BPM for 10 seconds  Stimulation: none  NST Interpretation: reactive    End Time:  04/12/2024 8:50 PM          Duration of test (min): 20  Location of NST Fetal Heart Tracing: Archived electronically      Test performed By: Suzen JINNY Kin, RN  04/12/2024 10:04 PM

## 2024-04-12 NOTE — Telephone Encounter (Signed)
 Patient states that she is [redacted] weeks pregnant with twins and has noticed a rash from her ankles to her knees. She states that it is bright red and bumpy.  Patient also notes that her feet and ankles are very swollen.    Patient denies other symptoms of preeclampsia.  Patient has appointment on 04/15/24.    Will route to provider.

## 2024-04-12 NOTE — Invasive Procedure Plan of Care (Signed)
 CONSENT FOR MEDICAL  OR SURGICAL PROCEDURE                            Patient Name: Madison Rose  [facility] [address]                                                              DOB: 06-Jun-1991         Please read this form or have someone read it to you.   It's important to understand all parts of this form. If something isn't clear, ask us  to explain.   When you sign it, that means you understand the form and give us  permission to do this surgery or procedure.     I agree for Inpatient, Attending Physician , and OBGYN Attending Physician, OBGYN Residents, MFM Fellows, Medical Students along with any assistants* they may choose, to treat the following condition(s): The indication for Cesarean section is non-reassuring fetal and maternal status, fetal malpresentation, elective repeat   By doing this surgery or procedure on me: Under anesthesia, make an incision in the lower abdomen, separate the bladder off lower uterus, make an incision in the uterus (with possible vertical incision called classical incision), deliver the baby and placenta, repair the incisions, pelvic exam under anesthesia.    This is also known as: Cesarean section   Laterality: Not applicable     *if you'd like a list of the assistants, please ask. We can give that to you.    1. The care provider has explained my condition to me. They have told me how the procedure can help me. They have told me about other ways of treating my condition. I understand the care provider cannot guarantee the result of the procedure. If I don't have this procedure, my other choices are: Alternatives to Cesarean section are to do a vaginal delivery or to do nothing.    2. The care provider has told me the risks (problems that can happen) of the procedure. I understand there may be unwanted results. The risks that are related to this procedure include: 1. Bleeding  2. Infection  3. Injury to bladder, bowel, ureters, major blood vessels, nerves, and/or ovaries  4.  Anesthesia risks including lung and heart problems  5. Hysterectomy  6. Clots in blood vessels or lungs  7. Fatal complications  8. Problems with bladder or bowel function or fistula (opening between bowel and/or bladder and/or vagina)  9. Pain from scars  10. Fetal laceration  11. Complications with future pregnancies  12. If you need the vertical uterine incision called classical incision, you may not try labor or have a vaginal delivery in any future pregnancies    3. I understand that during the procedure, my care provider may find a condition that we didn't know about before the treatment started. Therefore, I agree that my care provider can perform any other treatment which they think is necessary and available.    4. I give permission to the hospital and/or its departments to examine and keep tissue, blood, body parts, fluids or materials removed from my body during the procedure(s) to aid in diagnosis and treatment, after which they may be used for scientific research or teaching by appropriate persons.  If these materials are used for science or teaching, my identity will be protected. I will no longer own or have any rights to these materials regardless of how they may be used.    5. My care provider might want a representative from a medical device company to be there during my procedure. I understand that person works for:          The ways they might help my care provider during my procedure include:            6. Here are my decisions about receiving blood, blood products, or tissues. I understand my decisions cover the time before, during and after my procedure, my treatment, and my time in the hospital. After my procedure, if my condition changes a lot, my care provider will talk with me again about receiving blood or blood products. At that time, my care provider might need me to review and sign another consent form, about getting or refusing blood.    I understand that the blood is from the  community blood supply. Volunteers donated the blood, the volunteers were screened for health problems. The blood was examined with very sensitive and accurate tests to look for hepatitis, HIV/AIDS, and other diseases. Before I receive blood, it is tested again to make sure it is the correct type.    My chances of getting a sickness from blood products are small. But no transfusion is 100% safe. I understand that my care provider feels the good I will receive from the blood is greater than the chances of something going wrong. My care provider has answered my questions about blood products.      My decision  about blood or  blood products   Yes, I agree to receive blood or blood products if my care provider thinks they're needed.        My decision   about tissue  Implants     Not applicable.          I understand this  form.    My care provider  or his/her  assistants have  explained:   What I am having done and why I need it.  What other choices I can make instead of having this done.  The benefits and possible risks (problems) to me of having this done.  The benefits and possible risks (problems) to me of receiving transplants, blood, or blood products.  There is no guarantee of the results.  The care provider may not stay with me the entire time that I am in the operating or procedure room.  My provider has explained how this may affect my procedure. My provider has answered my  questions about this.         I give my  permission for  this surgery or  procedure.            _________________________________________                                     My signature  (or parent or other person authorized to sign for you, if you are unable to sign for yourself or if you are under 65 years old)             _____________     Date      (MM/DD/YYYY)           _______  Time      Electronic Signatures will display at the bottom of the consent form.    Care provider's statement: I have discussed the planned procedure,  including the possibility for transfusion of blood  products or receipt of tissue as necessary; expected benefits; the possible complications and risks; and possible alternatives  and their benefits and risks with the patients or the patient's surrogate. In my opinion, the patient or the patient's surrogate  understands the proposed procedure, its risks, benefits and alternatives.              Electronically signed by: Tamala Ilda Badger, MD                                                04/12/2024         Date        9:03 PM        Time   Your doctor or someone your doctor has appointed has told you that you may need blood or a blood product transfusion, which has been collected from volunteers, as part of your treatment as a patient.    The reasons you might need blood or blood products include, but are not limited to:   Significant loss of your own blood  Your body may not be getting enough oxygen to its tissues   Treatment of bleeding disorders caused by low platelet counts or platelets that do not work right (platelets are part of a cell that helps to form clots and keeps you from bleeding too much).  You may not have enough of other substances that help your blood clot or stop you from bleeding more  The risks of getting a transfusion of blood or blood products include, but are not limited to:   Damage to the lungs  Difficulty breathing due to fluid in the lungs  The product may contain bacteria or rarely a virus (which includes HIV and Hepatitis)  Blood from the community blood supply has been collected from volunteer donors who have been screened for health risk. The blood has been tested for major blood transmitted disease, but no transfusion is 100% safe. The blood is tested with very sensitive and accurate tests to screen for hepatitis, AIDS, and other disease, which makes the risks very small.  You may have side effects from the transfusion (rash, fever, chills) or an allergic reaction  The  transfusion increases your risks of getting infection or cancer coming back  The transfusion can increase the time you have to stay in the hospital  The transfusion can potentially cause death if the wrong blood is given or your body rejects the blood  Before blood is transfused, it is tested again to make sure it is the correct type  There are other options than getting blood or blood products from other people and they include:   Drugs which can decrease bleeding  Drugs which can cause your body to make more blood (used in elective procedures with advance notice)  Autologous (your own blood) donation (needs pre-arrangement)  No transfusion  If you exercise your right to refuse to be transfused with blood or blood products; these things listed below, among others, could happen to you:  Your body may not get enough oxygen and suffer damage  You may have a higher chance of bleeding  You may limit other options for your condition  You may die from losing too much blood

## 2024-04-13 ENCOUNTER — Other Ambulatory Visit: Payer: Self-pay

## 2024-04-13 DIAGNOSIS — O26892 Other specified pregnancy related conditions, second trimester: Secondary | ICD-10-CM

## 2024-04-13 DIAGNOSIS — O30041 Twin pregnancy, dichorionic/diamniotic, first trimester: Secondary | ICD-10-CM

## 2024-04-13 DIAGNOSIS — O26899 Other specified pregnancy related conditions, unspecified trimester: Secondary | ICD-10-CM

## 2024-04-13 DIAGNOSIS — O30042 Twin pregnancy, dichorionic/diamniotic, second trimester: Secondary | ICD-10-CM

## 2024-04-13 DIAGNOSIS — O2302 Infections of kidney in pregnancy, second trimester: Principal | ICD-10-CM

## 2024-04-13 LAB — BASIC METABOLIC PANEL
Anion Gap: 9 (ref 7–16)
CO2: 20 mmol/L (ref 20–28)
Calcium: 8.2 mg/dL — ABNORMAL LOW (ref 8.8–10.2)
Chloride: 106 mmol/L (ref 96–108)
Creatinine: 0.53 mg/dL (ref 0.51–0.95)
Glucose: 79 mg/dL (ref 60–99)
Lab: 5 mg/dL — ABNORMAL LOW (ref 6–20)
Potassium: 3.9 mmol/L (ref 3.3–5.1)
Sodium: 135 mmol/L (ref 133–145)
eGFR BY CREAT: 123 *

## 2024-04-13 LAB — CBC AND DIFFERENTIAL
Baso # K/uL: 0 10*3/uL (ref 0.0–0.2)
Eos # K/uL: 0.3 10*3/uL (ref 0.0–0.5)
Hematocrit: 28 % — ABNORMAL LOW (ref 34–49)
Hemoglobin: 9.5 g/dL — ABNORMAL LOW (ref 11.2–16.0)
IMM Granulocytes #: 0.4 10*3/uL — ABNORMAL HIGH (ref 0.0–0.0)
IMM Granulocytes: 4.7 %
Lymph # K/uL: 1.6 10*3/uL (ref 1.0–5.0)
MCV: 95 fL (ref 75–100)
Mono # K/uL: 0.8 10*3/uL (ref 0.1–1.0)
Neut # K/uL: 4.6 10*3/uL (ref 1.5–6.5)
Nucl RBC # K/uL: 0 10*3/uL (ref 0.0–0.0)
Nucl RBC %: 0 /100{WBCs} (ref 0.0–0.2)
Platelets: 205 10*3/uL (ref 150–450)
RBC: 3 MIL/uL — ABNORMAL LOW (ref 4.0–5.5)
RDW: 13.1 % (ref 0.0–15.0)
Seg Neut %: 59.7 %
WBC: 7.7 10*3/uL (ref 3.5–11.0)

## 2024-04-13 LAB — SYPHILIS SCREEN
Syphilis Screen: NEGATIVE
Syphilis Status: NONREACTIVE

## 2024-04-13 MED ORDER — WHITE PETROLATUM GEL *WRAPPED*
Freq: Two times a day (BID) | CUTANEOUS | Status: DC | PRN
Start: 2024-04-13 — End: 2024-04-14

## 2024-04-13 MED ORDER — CEPHALEXIN 500 MG PO CAPS *I*
500.0000 mg | ORAL_CAPSULE | Freq: Four times a day (QID) | ORAL | 0 refills | Status: AC
Start: 2024-04-13 — End: 2024-04-20
  Filled 2024-04-13: qty 28, 7d supply, fill #0

## 2024-04-13 MED ORDER — CEPHALEXIN 500 MG PO CAPS *I*
500.0000 mg | ORAL_CAPSULE | Freq: Every day | ORAL | 0 refills | Status: DC
Start: 2024-04-21 — End: 2024-07-07
  Filled 2024-04-13 – 2024-05-16 (×2): qty 30, 30d supply, fill #0

## 2024-04-13 NOTE — Progress Notes (Signed)
 ANTEPARTUM INPATIENT PROGRESS NOTE       LOS: 0 days       Subjective   Kasidi is feeling well this morning. Reports normal fetal movements. Denies leakage of fluid, vaginal bleeding, or contractions. Denies fever, chills, nausea, or vomiting.    Objective     Maternal vital signs  Vitals:    04/12/24 1945 04/12/24 2053 04/13/24 0145 04/13/24 0406   BP:   116/52 121/73   Pulse: 98 98 100 87   Resp:   16 16   Temp:   36.6 C (97.9 F) 36.5 C (97.7 F)   TempSrc:   Temporal Temporal   SpO2:   98% 98%       Fetal vital signs  Baseline FHR: 135 bpm (04/12/24 2053)  Doppler Rate: 136 BPM (04/13/24 0406)    Physical exam  General: NAD, well-appearing  Mental Status: Alert and oriented x 3  Cardiovascular: Normal heart rate  Respiratory: Unlabored respiratory effort on room air  Abdomen: Soft, without fundal tenderness, mild CVA tenderness  Neurological:  Grossly intact  Extremities/Skin: Minimal edema of pedal and pretibial (1+) (left > right)     Laboratory Evaluation  Blood counts  Recent Labs   Lab 04/12/24  1952   WBC 8.0   Hemoglobin 9.5*   Hematocrit 28*   Platelets 217     No components found with this basename: NEUTOPHILPCT, MONOPCT, EOSPCT   Metabolic panel  No results for input(s): NA, K, CL, CO2, UN, CREAT, GFRC, GLU, CA, MG, PO4 in the last 168 hours.   Hepatobiliary testing  No results for input(s): TP, ALB, AST, ALT, TB, DB, ALK in the last 168 hours.  Coagulopathy testing  Recent Labs   Lab 04/12/24  1952   Protime 10.2   INR 0.9   aPTT 26.0      GBS Testing  No results found for: GBS        Assessment & Plan     Madison Rose is a 33 y.o. [id number] at [redacted]w[redacted]d admitted with admitted pyelonephritis. Vitals are within normal limits.     C/F Pyelonephritis ISO recurrent UTIs  - Klebsiella positive on 3/12, 3/27, 4/8   -s/p treatment with  Macrobid  (3/14-3/19), s/p cefpodixime (4/10-4/17)   - WBC 8.0   - Abx: Ceftriaxone  (4/29-)  - Renal USN: mild b/l hydronephrosis, no  obvious renal stones  - Plan for IV Ceftriaxone  until resolution of flank pain followed by Keflex  treatment and suppression      Asymmetric LLE swelling, petechiae   -Bilateral LE USN 4/29: no evidence of DVT   -PT, PTT, INR WNL, Plts 217  -compression stockings    Prenatal care coordination  - Prenatal labs reviewed/completed on admission.  GBS not collected  - Infant: female x2  - Feeding: bottle  - PPBC:  to be discussed  - TDaP vaccine to be given around 28 weeks    Delivery/Discharge Planning  -pending clinical course    Tamala JULIANNA Badger, MD   Obstetrics and Gynecology, PGY-4

## 2024-04-13 NOTE — Progress Notes (Signed)
 Report received from Luke, [place].  Pt arrived to [id number] at approx 0352 on 04/13/24.  Oriented pt to unit and to room, including use of call light.  Admission materials given and reviewed, including the following handouts: Hospital admission packet (including HCP info and Patient [place] of Rights), 312 Unit handout, infection prevention and handwashing brochures, antepartum teaching materials.  Hand sanitizer at bedside and use encouraged.  Please see doc flowsheets for full assessment.

## 2024-04-13 NOTE — Discharge Instructions (Signed)
 [place] [facility] [place]   Antepartum Discharge Instructions    You were admitted to the hospital for pyelonephritis. Your condition has been determined to be stable and you are now being discharged.    Activity: You may resume normal activity.   Diet: You may resume your usual diet.     See medication reconciliation regarding antibiotics, you will take Keflex  four times daily for 7 days followed by once daily for the remainder of the pregnancy.    Follow-up plan:   You should be seen by your primary Ob/Gyn within the next 2 weeks.     Call your primary Ob/Gyn if you experience any of the following:  1. Worsening of your symptoms  2. Vaginal bleeding (bright red or heavier than at present)  3. Leakage of fluids from your vagina  4. Severe headache, chest pain, difficulty breathing  5. Severe abdominal pain, especially if on the right side  6. Baby moving less or not at all  7. Fevers or chills     If you cannot reach your primary Ob/Gyn, come to [facility] Triage.   Call 911 if it is an emergency.

## 2024-04-13 NOTE — Progress Notes (Signed)
 Pt aware of and agreeable to plan for discharge.  No PIV access at time of discharge.  Prescriptions complete.  Discharge instructions reviewed with pt and pt verbalized understanding.

## 2024-04-13 NOTE — Procedures (Addendum)
 Fetal Non-Stress Test    Patient: Madison Rose Age: 33 y.o.  Date of Birth: 1990-12-20  [id number]  eMRN: [id number]  GA: [redacted]w[redacted]d  Maternal  Heart Rate: 95 (04/13/24 0824)  BP: 117/76 (04/13/24 0824)    Reason For Exam: Routine daily NST  Education Done:  NST    Fetal non-stress test for twin pregnancy  Pre Procedure Diagnosis: Pyelonephritis affecting pregnancy  Post Procedure Diagnosis: NST (non-stress test) reactive            NST Start Time:  04/13/2024 11:19 AM            Uterine Irritability: No            Contractions: none    NST Fetus A  Variability: moderate  FHR Baseline: 140  Decelerations: variable  Accelerations: at least 10 BPM for 10 seconds  Stimulation: none  NST Interpretation: reactive    NST Fetus B  Variability: moderate  FHR Baseline: 140  Decelerations: variable  Accelerations: at least 10 BPM for 10 seconds  Stimulation: none  NST Interpretation: reactive    End Time:  04/13/2024 11:59 AM          Duration of test (min): 40  Location of NST Fetal Heart Tracing: Archived electronically      Test performed By: Alan JAYSON Moons, RN  04/13/2024 12:16 PM

## 2024-04-14 ENCOUNTER — Ambulatory Visit: Payer: Self-pay

## 2024-04-14 ENCOUNTER — Other Ambulatory Visit: Payer: Self-pay

## 2024-04-15 ENCOUNTER — Telehealth: Payer: Self-pay

## 2024-04-15 ENCOUNTER — Encounter: Payer: MEDICAID | Admitting: Obstetrics & Gynecology

## 2024-04-15 LAB — AEROBIC CULTURE

## 2024-04-15 NOTE — Telephone Encounter (Signed)
 Writer called and spoke with patient. Appt scheduled.

## 2024-04-15 NOTE — Telephone Encounter (Signed)
 Patient no-showed OB appointment today, 04/15/24.   Discussed with provider that patient should follow up as soon as possible.    Routed to Madison Rose for follow up.     [name] Z Shondra Capps

## 2024-04-15 NOTE — Progress Notes (Deleted)
 [place] [facility]  OBSTETRICS AND GYNECOLOGY     Gender Wellness Obstetrics and Gynecology  OB Check at [redacted]w[redacted]d    Subjective     Madison Rose is a 33 y.o., [phone] female presenting for routine OBC at [redacted]w[redacted]d. *** vs hospital follow up ***    She was recently admitted for pyelonephritis. She was treated with IV antibiotics inpatient and has been taking her PO Keflex  four times daily.  Reviewed need to continue with suppression for the remainder of pregnancy.     During her hospital stay, she also was noted to have asymmetric swelling of her lower extremities. Her L calf was swollen more than the R calf. She had a lower extremity US  which was negative for DVT.      Contractions: {Yes/No/Free text:23311}   Loss of fluid: {Yes/No/Free text:23311}   Bleeding: {Yes/No/Free text:23311}   FM:  {Yes/No/Free text:23311}      Problem list, vitals, family & social history, medications, and allergies were reviewed and updated as appropriate today.    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  *** always check BP and weight gain  There is no height or weight on file to calculate BMI.                Exam:    General: Well appearing, sitting comfortably, NAD  Mental Status: Alert and oriented  HEENT: Normocephalic, atraumatic  Respiratory: Normal respiratory effort  Abdomen: Soft, gravid, nontender, nondistended  Fundus: Nontender, fundal height ***cm, size  {c/w} dates  FHT: ***    Urine POCT:                         *** Patient did not leave urine specimen    Assessment and Plan     Madison Rose is a 33 y.o., [phone] female presenting for routine OBC at [redacted]w[redacted]d. ***    .PROBAPNOTES    Di-Di twins  - 4/25 US : growth concordant, normal AFI    Pyelo    Lower extremities ***  Anemia ***     Dichorionic Diamniotic Twin Pregnancy   -Last USN 04/08/2024: Fetus A Vertex, EFW 818 grams (62%-ile), AFI WNL, placenta anterior. Fetus B Vertex, EFW 812 grams (60%-ile), AFI WNL, placenta posterior, concordant growth      AMA      UTI in pregnancy  -Klebsiella positive on 3/12, 3/27, 4/8   -s/p treatment with  Macrobid  (3/14-3/19), s/p cefpodixime (4/10-4/17)      Sickle cell trait     Hx of C/S  -records unavailable, 2009 in P6 at 40 weeks for fetal intolerance      Migraines     Hx of abdominoplasty     SMA carrier     Elevated early 1 hr GTT  Anemia     Supervision of Pregnancy  - RH O RH POS  - PPBCM: ***  - Delivery planning: ***  - Feeding: ***  - Labs *** 28 week labs ordered including glucola, CBC obtained 4/30 H/H 9.5/28  ***   - US  ***  - Vaccinations: ***    Follow-up:   Return to the office in {Time; weeks 1-4:12805} for OBC. (2 or 4?)    D/w ***    Ronita Sharps, MD  Obstetrics & Gynecology PGY-1    *** TIPS      24-28 weeks:  -CBC  -Glucola  -urine protein: risk factors for gestational hypertension, such as first pregnancy or high blood pressure or kidney disease prior to pregnancy  -Tdap (starting at 28 weeks)  -If Rh neg, needs Ab screen and RhoGAM (over 26 weeks)  -Discuss post-partum contraception plan by 28 weeks!    35-37:  -GBS (after 36 weeks)    36 weeks and on:  -fetal lie}    Weeks 4 to 28 of pregnancy. Go for one checkup every 4 weeks (once a month).  Weeks 28 to 36 of pregnancy. Go for one checkup every 2 weeks (twice a month).  Weeks 36 to 41 of pregnancy. Go for one checkup every week (once a week).

## 2024-04-18 NOTE — Telephone Encounter (Signed)
 Writer called and left a vm message for the patient to call to reschedule her missed routine prenatal appointment.    Writer was going to offer the next available appointment.    Please assist when the patient returns the call.

## 2024-04-20 ENCOUNTER — Telehealth: Payer: Self-pay

## 2024-04-20 NOTE — Telephone Encounter (Signed)
 [employer] case manager for this patient called and left her contact info if she may be of assistance in the patient's care. Anell Baptist, RN [phone]

## 2024-05-03 ENCOUNTER — Other Ambulatory Visit: Payer: Self-pay

## 2024-05-06 ENCOUNTER — Ambulatory Visit: Payer: MEDICAID | Attending: Obstetrics and Gynecology

## 2024-05-06 ENCOUNTER — Other Ambulatory Visit: Payer: Self-pay | Admitting: Obstetrics and Gynecology

## 2024-05-06 DIAGNOSIS — D251 Intramural leiomyoma of uterus: Secondary | ICD-10-CM | POA: Insufficient documentation

## 2024-05-06 DIAGNOSIS — Z3490 Encounter for supervision of normal pregnancy, unspecified, unspecified trimester: Secondary | ICD-10-CM | POA: Insufficient documentation

## 2024-05-06 DIAGNOSIS — O34211 Maternal care for low transverse scar from previous cesarean delivery: Secondary | ICD-10-CM | POA: Insufficient documentation

## 2024-05-06 DIAGNOSIS — E669 Obesity, unspecified: Secondary | ICD-10-CM | POA: Insufficient documentation

## 2024-05-06 DIAGNOSIS — O30043 Twin pregnancy, dichorionic/diamniotic, third trimester: Secondary | ICD-10-CM | POA: Insufficient documentation

## 2024-05-06 DIAGNOSIS — O99213 Obesity complicating pregnancy, third trimester: Secondary | ICD-10-CM | POA: Insufficient documentation

## 2024-05-06 DIAGNOSIS — Z3A29 29 weeks gestation of pregnancy: Secondary | ICD-10-CM | POA: Insufficient documentation

## 2024-05-11 ENCOUNTER — Observation Stay
Admission: AD | Admit: 2024-05-11 | Discharge: 2024-05-12 | Disposition: A | Discharge status: Home / Self Care | Payer: MEDICAID | Source: Ambulatory Visit | Attending: Obstetrics & Gynecology | Admitting: Obstetrics & Gynecology

## 2024-05-11 ENCOUNTER — Other Ambulatory Visit: Payer: Self-pay

## 2024-05-11 ENCOUNTER — Observation Stay: Payer: MEDICAID

## 2024-05-11 DIAGNOSIS — R109 Unspecified abdominal pain: Secondary | ICD-10-CM | POA: Insufficient documentation

## 2024-05-11 DIAGNOSIS — Z3490 Encounter for supervision of normal pregnancy, unspecified, unspecified trimester: Principal | ICD-10-CM

## 2024-05-11 DIAGNOSIS — R102 Pelvic and perineal pain: Secondary | ICD-10-CM | POA: Insufficient documentation

## 2024-05-11 DIAGNOSIS — Z3A29 29 weeks gestation of pregnancy: Secondary | ICD-10-CM | POA: Insufficient documentation

## 2024-05-11 DIAGNOSIS — O26893 Other specified pregnancy related conditions, third trimester: Principal | ICD-10-CM | POA: Insufficient documentation

## 2024-05-11 MED ORDER — CYCLOBENZAPRINE HCL 5 MG PO TABS *I*
5.0000 mg | ORAL_TABLET | Freq: Once | ORAL | Status: AC
Start: 2024-05-11 — End: 2024-05-11
  Administered 2024-05-11: 5 mg via ORAL
  Filled 2024-05-11: qty 1

## 2024-05-11 MED ORDER — ACETAMINOPHEN 500 MG PO TABS *I*
1000.0000 mg | ORAL_TABLET | Freq: Once | ORAL | Status: AC
Start: 2024-05-11 — End: 2024-05-11
  Administered 2024-05-11: 1000 mg via ORAL
  Filled 2024-05-11: qty 2

## 2024-05-11 NOTE — OB Triage Note (Signed)
 OBSTETRICS EVALUATION      Referring Hospital (if applicable): {Referring Hospital:[id number]::"Referring Hospital:"}  Primary OB-GYN: ***    Chief Complaint: ***    HPI     Madison Rose is a 33 y.o. [id number] at [redacted]w[redacted]d with pregnancy complicated by risks outlined below who presents for pelvic and low back pressure and pain. This has been occurring for 2 days. Reports pain coming about once per hour, lasting a few seconds at a time. Took tylenol  around 3:30pm. At worst 9/10, 4-5/10 at baseline. Pain is sharp and shooting in quality.     Denies contractions, VB, LOF. Endorses normal fetal movement.     Denies dysuria but does endorse pressure when she urinates, denies hematuria. Endorses white discharge, denies itching.     Pregnancy Risks     Dichorionic Diamniotic Twin Pregnancy      AMA      UTI in pregnancy  -Klebsiella positive on 3/12, 3/27, 4/8   -s/p treatment with  Macrobid  (3/14-3/19), s/p cefpodixime (4/10-4/17)   -4/29 klebsiella 10-50k iso pyelonephritis, treated with IV CTX then keflex  suppression   -pt reports she has been taking Keflex  QID, reports she is switching to daily dosing this week    Limited prenatal care  - no visits since admission 04/13/24, has only had 1 office visit  - no 3rd tri labs ***     Sickle cell trait     Hx of C/S  -records unavailable, 2009 in P6 at 40 weeks for "fetal intolerance"   -she has not discussed delivery planning with this pregnancy     Migraines     Hx of abdominoplasty     SMA carrier     Elevated early 1 hr GTT 144  - no subsequent testing     Anemia     Obstetrical History     OB History   Gravida Para Term Preterm AB Living   7 1 1  0 5 1   SAB IAB Ectopic Multiple Live Births   0 0 0 0 1      # Outcome Date GA Lbr Len/2nd Weight Sex Type Anes PTL Lv   7 Current            6 Term 10/20/08 [redacted]w[redacted]d  3610 g (7 lb 15.3 oz) F *CSLT Spinal N LIV      Complications: Fetal Intolerance   5 AB      TAB      4 AB      SAB      3 AB      SAB      2 AB      TAB      1 AB       TAB         Obstetric Comments   12/23/23: No carrier screening results found.   Pt states she is a carrier of sickle cell trait. States the FOB is not.    G7 P1051 is correct, does not wish to add dates of prior TABs/SABs         Prenatal Labs          Urine spot P/C ratio:      Lab results: 02/08/24  1038   TP Creatinine ratio,UR 0.07             Lab results: 04/12/24  1952 03/25/24  2006 03/22/24  1613 02/29/24  1158   ABO RH Blood Type O RH POS  --   --  O RH POS   Rubella IgG AB  --   --   --  0.8   Syphilis Screen Neg  --   --  Neg   HIV 1&2 ANTIGEN/ANTIBODY  --   --   --  Nonreactive   HBV S Ag  --   --   --  NEG   N. gonorrhoeae NAAT (PCR)  --  NEGATIVE   < >  --    Chlamydia NAAT (PCR)  --  NEGATIVE   < >  --     < > = values in this interval not displayed.        Lab results: 02/29/24  1158   Glucose,50gm 1HR 144*      No results for input(s): "GL0", "GL1H", "GL2H", "GL3H" in the last 8760 hours.        Prenatal Ultrasound Review (Pertinent Findings):  05/06/24: A breech maternal L, anterior placenta, AFI wnl, EFW 30%ile  B breech, maternal R, posterior placenta, AFI wnl, EFW 49%ile      Physical Exam     Vitals:    05/11/24 2149   BP: 116/81   Pulse: (!) 113   Resp: 20   Temp: 36.2 C (97.2 F)   TempSrc: Temporal   SpO2: 100%       {Choose how you'd like to document your physical NGEX!:[id number]}     {Cervical Exam (Optional will disappear if not selected when signing note!):[id number]}    {Sterile Spec Exam  (Optional - if not filled out will disappear upon note signing):[phone]::"Sterile Speculum Exam:"}    {Wet Prep Results (Optional):[phone]::"Wet Prep: "}    Estimated Fetal Weight: *** grams by {Fetal Weight Estimation Method:999004091}    Presentation: {obgyn fetal presentation:315498} by ultrasound    Placental location: {ob gyn placenta location:312808}      Fetal Monitoring:  Baseline: 140 bpm  Variability: Moderate (6-25 BPM)  Accelerations: Yes 10X10  Decelerations: None  Reactive NST  2146-2206    Baseline: 145 bpm  Variability: Moderate (6-25 BPM)  Accelerations: Yes 10X10  Decelerations: None  Reactive NST 2146-2206    Toco: No contractions observed      Assessment & Plan     {Choose which A&P you'd like for this visit!:999004092}

## 2024-05-12 ENCOUNTER — Ambulatory Visit: Payer: Self-pay

## 2024-05-12 DIAGNOSIS — R109 Unspecified abdominal pain: Secondary | ICD-10-CM

## 2024-05-12 DIAGNOSIS — Z3A30 30 weeks gestation of pregnancy: Secondary | ICD-10-CM

## 2024-05-12 DIAGNOSIS — O30043 Twin pregnancy, dichorionic/diamniotic, third trimester: Secondary | ICD-10-CM

## 2024-05-12 DIAGNOSIS — R102 Pelvic and perineal pain: Secondary | ICD-10-CM | POA: Insufficient documentation

## 2024-05-12 DIAGNOSIS — O26891 Other specified pregnancy related conditions, first trimester: Secondary | ICD-10-CM

## 2024-05-12 LAB — URINE MICROSCOPIC (IQ200): RBC,UA: NONE SEEN /HPF (ref 0–2)

## 2024-05-12 LAB — URINALYSIS WITH REFLEX TO MICROSCOPIC
Blood,UA: NEGATIVE
Glucose,UA: NEGATIVE
Ketones, UA: NEGATIVE
Nitrite,UA: NEGATIVE
Specific Gravity,UA: 1.016 (ref 1.002–1.030)
pH,UA: 6.5 (ref 5.0–8.0)

## 2024-05-12 NOTE — OB Triage Note (Signed)
Patient meets discharge criteria. Pt opted to review discharge instructions via MyChart, pt had no questions or concerns. Pt discharged home ambulatory.

## 2024-05-12 NOTE — Procedures (Signed)
 Fetal Non-Stress Test    Patient: Madison Rose Age: 33 y.o.  Date of Birth: 1991-07-06  [id number]  eMRN: [id number]  GA: [redacted]w[redacted]d  Maternal  Heart Rate: (!) 113 (05/11/24 2149)  BP: 116/81 (05/11/24 2149)    Reason For Exam: abd pain, triage observation  Education Done:  External fetal monitoring NST    Fetal non-stress test for twin pregnancy  Pre Procedure Diagnosis: Unspecified abdominal pain  Post Procedure Diagnosis: NST (non-stress test) reactive            NST Start Time:  05/11/2024 9:47 PM            Uterine Irritability: No            Contractions: rare    NST Fetus A  Variability: moderate  FHR Baseline: 135  Decelerations: none   Accelerations: at least 10 BPM for 10 seconds  Stimulation: none  NST Interpretation: reactive    NST Fetus B  Variability: moderate  FHR Baseline: 140  Decelerations: none   Accelerations: at least 10 BPM for 10 seconds  Stimulation: none  NST Interpretation: reactive    End Time:  05/11/2024 10:07 PM          Duration of test (min): 20  Location of NST Fetal Heart Tracing: Archived electronically  Reviewed with:  Dr. Aileen Alexanders      Test performed By: Linnea Richards, RN  05/12/2024 12:54 AM

## 2024-05-12 NOTE — Discharge Instructions (Signed)
 Reason for Triage Visit: pelvic pain  Destination: Home  Mode: Ambulatory    Procedures done in triage: Fetal monitoring, Sterile speculum exam, Cervical exam, and Medications  Pending Laboratory Data: Urine culture    Diet: Regular    Activity: Regular activity    Special Instructions: Call or return to triage if you have worsening of any of your symptoms, if you have more than 6 contractions in an hour, if you have vaginal bleeding or leakage of clear fluid, if you have headache or vision changes, if you have right-sided pain, or if you don't feel the baby moving.      We will message the office to help establish a follow-up appointment for you.     If you cannot reach your OB/Gyn or Midwife, call the Labor and Delivery Unit or 911 if it is an emergency.    Follow-up with your [facility] provider as previously instructed.    PATIENT INSTRUCTIONS FOR GLUCOSE CHALLENGE (GLUCOLA) TEST:     In your 24-28th week the glucose challenge test is done. We also check a blood count for anemia, HIV and a syphilis test. This blood test has been ordered to screen for problems in utilizing glucose (or sugar) during pregnancy. It is an uncommon problem during pregnancy. However, early detection is helpful in good control. The results of your test are obtained by our office in three days. If you need further testing or a change in diet, we will contact you.     INSTRUCTIONS: On the day of the test eat a normal diet prior to the examination. Visit any [name] lab that works for you.     1) Drink the Glucola within 5 minutes.      2) Have nothing to eat or drink after. Do not smoke.      3) At the laboratory let someone know you are to have the blood test done and at what time. Give yourself enough time before your scheduled obstetrical appointment at our office. Do not plan your blood draw at the same time as your appointment.      4) Bring along something to eat when you have your blood drawn, such as cheese and crackers or one-half of a  sandwich. Try to eat this as soon as your blood test is completed. It will help you overcome any nausea which some people experience.      If you have any questions regarding these instructions, please call the office or contact us  through My Chart.

## 2024-05-13 ENCOUNTER — Other Ambulatory Visit: Payer: Self-pay

## 2024-05-13 ENCOUNTER — Telehealth: Payer: Self-pay

## 2024-05-13 LAB — AEROBIC CULTURE: Aerobic Culture: 0

## 2024-05-13 LAB — GROUP B STREP CULTURE: Group B Strep Culture: 0

## 2024-05-13 NOTE — Telephone Encounter (Addendum)
 Pt calling to schedule OB appt. She was seen in OBT 05/11/24 for abd pain/pressure. She was not in labor and her NST was reassuring. CCU -negative for UTI. Pt states she is having twins and they are both breech..Today she is still having lower abd cramping like "period cramps.". Denies rhythmic ctx, LOF/VB. Endorses good FM with both babies.  She reports eating/drinking WNL. Needs OBC visit scheduled; routine and also to discuss delivery planing. Will assist to schedule appt with a resident/attending and call pt back. Aware of when to return to triage. No further questions at this  time.TC to pt. Appt offered and accepted for 05/23/24 with Dr. Rogerio Clay.

## 2024-05-16 ENCOUNTER — Other Ambulatory Visit: Payer: Self-pay

## 2024-05-17 ENCOUNTER — Other Ambulatory Visit: Payer: Self-pay

## 2024-05-17 DIAGNOSIS — O09522 Supervision of elderly multigravida, second trimester: Secondary | ICD-10-CM

## 2024-05-17 DIAGNOSIS — O30042 Twin pregnancy, dichorionic/diamniotic, second trimester: Secondary | ICD-10-CM

## 2024-05-23 ENCOUNTER — Ambulatory Visit: Payer: MEDICAID | Attending: Obstetrics & Gynecology | Admitting: Obstetrics & Gynecology

## 2024-05-23 ENCOUNTER — Encounter: Payer: Self-pay | Admitting: Obstetrics & Gynecology

## 2024-05-23 ENCOUNTER — Other Ambulatory Visit: Payer: Self-pay

## 2024-05-23 ENCOUNTER — Ambulatory Visit: Payer: MEDICAID

## 2024-05-23 VITALS — BP 133/83 | HR 112 | Ht 70.0 in | Wt 265.4 lb

## 2024-05-23 VITALS — HR 112

## 2024-05-23 DIAGNOSIS — O34219 Maternal care for unspecified type scar from previous cesarean delivery: Secondary | ICD-10-CM | POA: Insufficient documentation

## 2024-05-23 DIAGNOSIS — R7309 Other abnormal glucose: Secondary | ICD-10-CM

## 2024-05-23 DIAGNOSIS — O30003 Twin pregnancy, unspecified number of placenta and unspecified number of amniotic sacs, third trimester: Secondary | ICD-10-CM

## 2024-05-23 DIAGNOSIS — O368130 Decreased fetal movements, third trimester, not applicable or unspecified: Secondary | ICD-10-CM | POA: Insufficient documentation

## 2024-05-23 DIAGNOSIS — O321XX1 Maternal care for breech presentation, fetus 1: Secondary | ICD-10-CM | POA: Insufficient documentation

## 2024-05-23 DIAGNOSIS — Z3A31 31 weeks gestation of pregnancy: Secondary | ICD-10-CM | POA: Insufficient documentation

## 2024-05-23 MED ORDER — LANCETS MISC *A*
5 refills | Status: DC
  Filled 2024-05-23 (×2): qty 100, 25d supply, fill #0

## 2024-05-23 MED ORDER — ALCOHOL SWABS PADS *I*
MEDICATED_PAD | 5 refills | Status: DC
  Filled 2024-05-23: qty 100, 14d supply, fill #0

## 2024-05-23 MED ORDER — GLUCOSE BLOOD VI STRP *A*
ORAL_STRIP | 5 refills | Status: DC
  Filled 2024-05-23 (×2): qty 100, 25d supply, fill #0

## 2024-05-23 MED ORDER — BLOOD GLUCOSE MONITOR SYSTEM KIT *A*
PACK | 0 refills | Status: DC
  Filled 2024-05-23: qty 1, 30d supply, fill #0

## 2024-05-23 NOTE — Progress Notes (Addendum)
 S: feeling dizzy; feet and hands are swollen; only feeling one baby move today.    Taking tylenol  for her migraines - has them every day.  6 hours a day.  Taking 3-4 tylenol  a day.    Taking keflex  1 time a day; no burning, some pain.      Does not take BP at home.    O:  BP 133/83   Pulse (!) 112   Ht 1.778 m (5\' 10" )   Wt 120.4 kg (265 lb 6.4 oz)   LMP 10/16/2023 (Exact Date)   BMI 38.08 kg/m     Alert, oriented, NAD    Last urine culture 5/28 negative    A/P:  33 yo [id number] with di/di TIUP; AMA, history of suspected pyelonephritis this pregnancy.  Insufficient prenatal care.      Di/di TIUP:  Breech/ Breech concordant 5/23    H/o cesarean: Plans RCS for delivery; will schedule for 38 weeks if not sooner for other indication    Elevated 1 hr - never did 3 hr - plan finger sticks; reminded re 3rd tri labs today.  RTC 1-2 days for glucometer teaching and then 1-2 weeks for review.    History of pyelonephritis this pregnancy - cont suppressive therapy.    NST today for dFM.

## 2024-05-23 NOTE — Procedures (Addendum)
 Fetal Non-Stress Test    Patient: Madison Rose Age: 33 y.o.  Date of Birth: [birth year]  [id number]  eMRN: [id number]  GA: [redacted]w[redacted]d  Maternal  Heart Rate: (!) 112 (05/23/24 1044)       Reason For Exam: DFM  Education Done:  External fetal monitoring NST VAS Manual Stimulation    Fetal non-stress test for twin pregnancy  Pre Procedure Diagnosis: Decreased fetal movement  Post Procedure Diagnosis: NST (non-stress test) reactive            NST Start Time:  05/23/2024 10:43 AM            Uterine Irritability:             Contractions: none    NST Fetus A  Variability: moderate  FHR Baseline: 145  Decelerations: none   Accelerations: at least 10 BPM for 10 seconds  Stimulation: none  NST Interpretation: reactive    NST Fetus B  Variability: moderate  FHR Baseline: 135  Decelerations: none   Accelerations: at least 10 BPM for 10 seconds  Stimulation: none  NST Interpretation: reactive    End Time:  05/23/2024 11:04 AM          Duration of test (min): 21  Location of NST Fetal Heart Tracing: Archived electronically  Comments:  Baby A maternal right, baby B maternal left      Test performed By: Fernanda Howell, RN  05/23/2024 10:48 AM

## 2024-05-24 ENCOUNTER — Telehealth: Payer: Self-pay

## 2024-05-24 ENCOUNTER — Other Ambulatory Visit: Payer: Self-pay

## 2024-05-24 NOTE — Progress Notes (Signed)
 [facility] -  Disenrollment     Date of Enrollment: 01/21/2024    Date of Disenrollment: 05/24/2024    Review Flowsheet          02/08/2024   PHQ Scores   PHQ Q9 - Better Off Dead 0   PHQ Calculated Score 3       Details          Patient-reported                   02/08/2024     9:43 AM   GAD-7 Dates   Total Score 1        Patient-reported       Presenting Concern: increased stress and anxiety    Treatment Interventions: pt did not engage with Clinical research associate    Current Disposition:     Follow Up Appointments: n/a    Birda Buffy

## 2024-05-24 NOTE — Telephone Encounter (Signed)
 Writer called and spoke with patient. Patient will be calling back to schedule.     Patient will need a visit with resource nurse for Glucometer teaching today or tomorrow. Patient will then need an OB check with an MD in 1 -2 weeks.

## 2024-05-25 ENCOUNTER — Other Ambulatory Visit: Payer: Self-pay

## 2024-05-26 NOTE — Telephone Encounter (Signed)
 Outgoing call to Madison Rose to follow up with home BG testing.     Has picked up BG testing supplies and checked one time at home. BG at home was 118 mg/dL--     Has decided she will complete the 3 hour testing, given lab number to schedule an appt and reviewed that this is a fasting test. Order previously placed is active.    Will complete testing within a week. Gavin will call the office with any new or changing concerns.

## 2024-05-27 NOTE — Telephone Encounter (Signed)
 Second attempt to get patient scheduled.    Writer attempted to contact patient. Writer gets a busy signal when calling.   [facility] sent to patient.     If patient returns call please assist patient with scheduling per message below.   If no MD appts in 2 weeks please schedule with NP and then schedule next apts with MD's.

## 2024-05-30 NOTE — Telephone Encounter (Signed)
Writer left vm X 1. If patient returns call please assist patient with scheduling per message below.

## 2024-05-31 ENCOUNTER — Telehealth: Payer: Self-pay

## 2024-05-31 NOTE — Telephone Encounter (Signed)
 TC from patient, [redacted]w[redacted]d with di-di twins, with concerns of abnormal vaginal discharge.    Patient reports the discharge started 3 days ago. Reports it is white and thick, looks like milk. Pt denies any vaginal itching or irritation. Denies vaginal bleeding or leakage of fluid. Reports good FM from babies.     Pt reports occasional cramping, rating 7/10 when it occurs. Reports it improves with getting up and moving around.     Pt scheduled for acute visit tomorrow at 8:45 for evaluation. Advised that if symptoms worsen she should call office back  and we would likely recommend she present to triage.

## 2024-05-31 NOTE — Telephone Encounter (Signed)
 Outgoing Mychart message to remind of need for 3 hour glucose testing.

## 2024-05-31 NOTE — Telephone Encounter (Signed)
 Incoming call from Patient regarding OB vaginal discharge.    Writer transferred the patient to the nursing staff due to experiencing discharge symptoms.

## 2024-05-31 NOTE — Telephone Encounter (Signed)
 Pt returning missed call, per message below. OBC has been scheduled for 06/09/24 with Np Vella Gey and 06/15/24 OBC w/Dr. Philip Bravo.

## 2024-06-01 ENCOUNTER — Other Ambulatory Visit: Payer: Self-pay

## 2024-06-01 ENCOUNTER — Ambulatory Visit: Payer: MEDICAID

## 2024-06-01 ENCOUNTER — Ambulatory Visit: Payer: Self-pay | Admitting: Obstetrics and Gynecology

## 2024-06-01 ENCOUNTER — Ambulatory Visit: Payer: MEDICAID | Attending: Obstetrics and Gynecology | Admitting: Obstetrics and Gynecology

## 2024-06-01 VITALS — BP 121/79 | HR 114 | Ht 70.0 in | Wt 267.0 lb

## 2024-06-01 DIAGNOSIS — O34211 Maternal care for low transverse scar from previous cesarean delivery: Secondary | ICD-10-CM | POA: Insufficient documentation

## 2024-06-01 DIAGNOSIS — R7309 Other abnormal glucose: Secondary | ICD-10-CM

## 2024-06-01 DIAGNOSIS — O3663X2 Maternal care for excessive fetal growth, third trimester, fetus 2: Secondary | ICD-10-CM | POA: Insufficient documentation

## 2024-06-01 DIAGNOSIS — O30043 Twin pregnancy, dichorionic/diamniotic, third trimester: Secondary | ICD-10-CM

## 2024-06-01 DIAGNOSIS — M549 Dorsalgia, unspecified: Secondary | ICD-10-CM | POA: Insufficient documentation

## 2024-06-01 DIAGNOSIS — O99213 Obesity complicating pregnancy, third trimester: Secondary | ICD-10-CM | POA: Insufficient documentation

## 2024-06-01 DIAGNOSIS — O1203 Gestational edema, third trimester: Secondary | ICD-10-CM | POA: Insufficient documentation

## 2024-06-01 DIAGNOSIS — Z3A32 32 weeks gestation of pregnancy: Secondary | ICD-10-CM | POA: Insufficient documentation

## 2024-06-01 DIAGNOSIS — O26893 Other specified pregnancy related conditions, third trimester: Secondary | ICD-10-CM | POA: Insufficient documentation

## 2024-06-01 DIAGNOSIS — E669 Obesity, unspecified: Secondary | ICD-10-CM

## 2024-06-01 DIAGNOSIS — O99891 Other specified diseases and conditions complicating pregnancy: Secondary | ICD-10-CM | POA: Insufficient documentation

## 2024-06-01 DIAGNOSIS — D251 Intramural leiomyoma of uterus: Secondary | ICD-10-CM | POA: Insufficient documentation

## 2024-06-01 DIAGNOSIS — R102 Pelvic and perineal pain: Secondary | ICD-10-CM

## 2024-06-01 LAB — TRICHOMONAS NAAT: Trichomonas NAAT: NEGATIVE

## 2024-06-01 LAB — POCT VAGINAL WET MOUNT
CLUE CELLS,POC: NEGATIVE
KOH FOR FUNGUS: NEGATIVE
Nitrazine PH, POCT: 4.5 (ref <=4.5)
TRICHOMONAS, POC: NEGATIVE
WHIFF TEST: NEGATIVE
YEAST,POC: NEGATIVE

## 2024-06-01 LAB — POCT 7 URINALYSIS DIPSTICK
Glucose,UA POCT: NORMAL mg/dL
Ketones,UA POCT: NEGATIVE mg/dL
Leuk Esterase,UA POCT: 1 — AB
Lot #: 78267404
Nitrite,UA POCT: NEGATIVE
PH,UA POCT: 6 (ref 5–8)

## 2024-06-01 LAB — CHLAMYDIA NAAT (PCR): Chlamydia NAAT (PCR): NEGATIVE

## 2024-06-01 LAB — N. GONORRHOEAE NAAT (PCR): N. gonorrhoeae NAAT (PCR): NEGATIVE

## 2024-06-01 MED ORDER — COMFORT FIT MATERNITY SUPP MED MISC *A*
0 refills | Status: DC
Start: 2024-06-01 — End: 2024-06-24

## 2024-06-01 MED ORDER — MEDICAL COMPRESSION SOCKS MISC *A*: 0 refills | Status: DC

## 2024-06-01 NOTE — Progress Notes (Signed)
 [facility], Obstetrics and Gynecology    Prenatal Visit:      Madison Rose is a  33 y.o., [phone] presenting for acute OBC at [redacted]w[redacted]d with several concerns. Pt endorses good fetal movement from A, less activity from B. Denies LoF, vaginal bleeding. Also endorses thick milky-white discharge, denies itching/burning.  Endorses intermittent 8.5/10 sharp pain on left side by rib cage and in lower abdomen above pubic bone, pain is consistent at baseline then gets worse occasionally. Feeling SOB when walking even short distances, had difficulty walking in from the parking lot. Feet are swollen. Reports daily bowel movements. No other concerns today.     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    May be eligible for HPV vaccination PPM Z23    Fall W19.XXXA    Anemia in pregnancy, second trimester O99.012    Elevated glucose tolerance test R73.09    H/O sickle cell trait Z86.2    AMA (advanced maternal age) multigravida 35+ O09.529    Abdominal pain affecting pregnancy O26.899, R10.9    Heartburn during pregnancy O26.899, R12    Pyelonephritis affecting pregnancy O23.00    Pyelonephritis N12    Pelvic pain R10.2    Breech presentation, fetus 1 of multiple gestation O78.1XX1    History of cesarean section complicating pregnancy O34.219       Problem list, medical, family & social history, medications, and allergies were reviewed and updated as appropriate today.    Vitals:  BP: 121/79    Height: 177.8 cm (5' 10)  Weight: 121.1 kg (267 lb)    Total Weight Gain: 20.4 kg (45 lb)          Fetal Heart Rate: 140/140       POCT:  Recent Results (from the past 24 hours)   POCT vaginal wet mount    Collection Time: 06/01/24  9:00 AM   Result Value Ref Range    TRICHOMONAS, POC Negative-none seen Negative    YEAST,POC Negative-none seen Negative    YEAST COMMENT,POC      CLUE CELLS,POC Negative-none seen Negative    LEUKOCYTES,POC       KOH FOR FUNGUS Negative-none seen Negative    KOH FOR FUNGUS COMMENT      WHIFF TEST Negative Negative    Nitrazine PH, POCT 4.5 <=4.5    Nitrazine pH Lot Number      Nitrazine pH Expiration Date     POCT 7 Urinalysis Dipstick    Collection Time: 06/01/24  9:31 AM   Result Value Ref Range    PH,UA POCT 6.0 5 - 8    Leuk Esterase,UA POCT +1 (!) Negative, Test Not Performed    Nitrite,UA POCT Negative Negative, Test Not Performed    Protein,UA POCT Trace (!) Negative, Test Not Performed mg/dL    Glucose,UA POCT Normal Normal mg/dL    Ketones,UA POCT Negative Negative mg/dL    Blood,UA POCT About 50 (!) Negative, Test Not Performed    Exp date 07/14/24     Lot # [id number]      Exam:   General: alert, cooperative, NAD  Pelvic exam: VULVA: normal appearing vulva with no masses, tenderness or lesions, VAGINA: normal appearing vagina with normal color and moderate amount of thin yellow-tinged mucoid discharge, no lesions, hard stool palpable behind posterior vaginal wall. CERVIX: visually closed, normal appearing cervix without discharge or lesions, UTERUS: uterus  is gravid and nontender.    Plan:  1. Pelvic pain affecting pregnancy in third trimester, antepartum (Primary)  - POCT vaginal wet mount not suggestive of acute vaginitis  - STI testing sent to r/o infection, will f/u prn  - Chlamydia NAAT (PCR); Future  - N. gonorrhoeae NAAT (PCR); Future  - Trichomonas NAAT; Future  - Fetal nonstress test to r/o contractions as cause of discomfort  - OB Ultrasound BPP for non-reactive NST; Standing  - OB Ultrasound for variables on NST; Standing  - POCT 7 Urinalysis Dipstick inconclusive, but not suggestive of infection  - Aerobic culture (Urine voided) sent to r/o infection  - reviewed that some symptoms may be related to constipation, encouraged increased PO hydration and fiber intake. Could consider adding colace and/or miralax  prn.    2. Back pain affecting pregnancy  - maternity support belt; Use as instructed  Dispense:  1 each; Refill: 0  - comfort measures reviewed    3. Edema during pregnancy in third trimester  - encouraged pt to increase PO hydration  - compression socks knee high; Wear fitted below the knee  Dispense: 4 each; Refill: 0      Follow-up:   Return to the office as scheduled for routine obc and prn    ADDENDUM: NST nonreactive for baby A. U/s for BPP showed:  - A: BPP 8/10, AFI normal, EFW 1968g, 32%ile, in vertex position  - B: BPP 10/10, AFI normal, EFW 2381g, 84%ile. In breech position  17% growth discordance between the dichorionic twins.   Msg sent to CCP to consider additional u/s evaluations prn

## 2024-06-01 NOTE — Procedures (Addendum)
 Fetal Non-Stress Test    Patient: Madison Rose Age: 33 y.o.  Date of Birth: Nov 14, 1991  [id number]  eMRN: [id number]  GA: [redacted]w[redacted]d  Maternal          Reason For Exam: 32 weeks DIDI twins pelvic pressure  Education Done:  External fetal monitoring Fetal kick counts NST    Fetal non-stress test for twin pregnancy  Pre Procedure Diagnosis: Pregnancy, supervision, high-risk  Post Procedure Diagnosis: NST (non-stress test) nonreactive            NST Start Time:  06/01/2024 9:40 AM            Uterine Irritability: No            Contractions: none    NST Fetus A  Variability: moderate  FHR Baseline: 140 baby A tracing as blue line  Decelerations: none   Accelerations: at least 10 BPM for 10 seconds  Stimulation: none  NST Interpretation: nonreactive    NST Fetus B  Variability: moderate  FHR Baseline: 140 baby B tracing as orange line  Decelerations: none   Accelerations: at least 15 BPM for 15 seconds  Stimulation: none  NST Interpretation: reactive    End Time:  06/01/2024 10:14 AM          Duration of test (min): 34  Location of NST Fetal Heart Tracing: Archived electronically  Reviewed with:  K.Lastacia Solum,MD  Comments:  Denies LOF,VB or regular CXNS.   has + FM X 2  BPP due to Baby A no reactive      Test performed By: Denise Gruttadaro, RN  06/01/2024 9:43 AM

## 2024-06-02 LAB — AEROBIC BACTERIAL URINE CULTURE: Aerobic bacterial urine culture: 0

## 2024-06-03 ENCOUNTER — Ambulatory Visit: Payer: MEDICAID | Attending: Obstetrics and Gynecology

## 2024-06-03 ENCOUNTER — Other Ambulatory Visit: Payer: Self-pay | Admitting: Obstetrics and Gynecology

## 2024-06-03 DIAGNOSIS — O99213 Obesity complicating pregnancy, third trimester: Secondary | ICD-10-CM | POA: Insufficient documentation

## 2024-06-03 DIAGNOSIS — E669 Obesity, unspecified: Secondary | ICD-10-CM | POA: Insufficient documentation

## 2024-06-03 DIAGNOSIS — Z3490 Encounter for supervision of normal pregnancy, unspecified, unspecified trimester: Secondary | ICD-10-CM | POA: Insufficient documentation

## 2024-06-03 DIAGNOSIS — Z3A33 33 weeks gestation of pregnancy: Secondary | ICD-10-CM | POA: Insufficient documentation

## 2024-06-03 DIAGNOSIS — O30043 Twin pregnancy, dichorionic/diamniotic, third trimester: Secondary | ICD-10-CM | POA: Insufficient documentation

## 2024-06-03 DIAGNOSIS — O368132 Decreased fetal movements, third trimester, fetus 2: Secondary | ICD-10-CM | POA: Insufficient documentation

## 2024-06-03 DIAGNOSIS — D251 Intramural leiomyoma of uterus: Secondary | ICD-10-CM | POA: Insufficient documentation

## 2024-06-03 DIAGNOSIS — O368130 Decreased fetal movements, third trimester, not applicable or unspecified: Secondary | ICD-10-CM | POA: Insufficient documentation

## 2024-06-03 DIAGNOSIS — O34211 Maternal care for low transverse scar from previous cesarean delivery: Secondary | ICD-10-CM | POA: Insufficient documentation

## 2024-06-06 ENCOUNTER — Telehealth: Payer: Self-pay

## 2024-06-06 NOTE — Telephone Encounter (Signed)
 Forms received from Amarianna Nicholls for completion.  Please answer each question.     **For OB patients, disability forms should not be handed in until patient is 36 weeks unless taken out of work by provider earlier.**    Was the patient informed to allow 7-10 days for all forms to be completed and we will fax forms to their employer or insurance company?  Yes    Fax number?: [phone]    Would the patient like a Mychart message or call once the forms are completed?  yes, a phone call at [phone]*.    What kind of forms need to be completed?  Short Term Disability    Has patient signed a ROI?  Yes    Is your disability pregnancy related?  Yes    Has your provider  taken you out of work?: N/A    If yes,when was the last day you worked? N/A    Are you having a scheduled gyn surgery/procedure or a c-section? N/A    What is the date of your surgery? n/a    How long did your provider indicate you would be out of work for? N/a    Forms were placed in the [facility] bin and a message is routed to the [facility] PSS too.

## 2024-06-07 NOTE — Telephone Encounter (Signed)
 Form completed, faxed, copied and placed in scanning.

## 2024-06-08 NOTE — Progress Notes (Deleted)
 Gender Wellness, Obstetrics and Gynecology    Prenatal Visit:      Madison Rose is a  33 y.o., [id number] presenting for routine OBC at [redacted]w[redacted]d.            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    May be eligible for HPV vaccination PPM Z23    Fall W19.XXXA    Anemia in pregnancy, second trimester O99.012    Elevated glucose tolerance test R73.09    H/O sickle cell trait Z86.2    AMA (advanced maternal age) multigravida 35+ O09.529    Abdominal pain affecting pregnancy O26.899, R10.9    Heartburn during pregnancy O26.899, R12    Pyelonephritis affecting pregnancy O23.00    Pyelonephritis N12    Pelvic pain R10.2    Breech presentation, fetus 1 of multiple gestation O37.1XX1    History of cesarean section complicating pregnancy O34.219       Problem list, medical, family & social history, medications, and allergies were reviewed and updated as appropriate today.    Vitals:               Total Weight Gain: 20.4 kg (45 lb)                  POCT:  No results found for this or any previous visit (from the past 24 hours).    Exam:         Plan:    Common discomforts of the third trimester, kick counts, s/s of preterm labor, s/s of preeclampsia, and third trimester warning signs reviewed with patient today.  Patient to notify [facility] with any third trimester warning signs, s/s or symptoms of preterm labor, s/s of preeclampsia, decreased fetal movement,  or concerns.        Pelvic pain affecting pregnancy in third trimester/Back pain affecting pregnancy  - maternity support belt; Use as instructed  Dispense: 1 each; Refill: 0  - comfort measures reviewed     Edema during pregnancy in third trimester  - encouraged pt to increase PO hydration  - compression socks knee high; Wear fitted below the knee  Dispense: 4 each; Refill: 0    Growth discordance    Ultrasound on 06/20 for BPP showed the following:     - A: BPP 8/10, AFI normal,  EFW 1968g, 32%ile, in vertex position  - B: BPP 10/10, AFI normal, EFW 2381g, 84%ile. In breech position  17% growth discordance between the dichorionic twins.     Follow-up:   Return to the office as scheduled for routine obc on 07/02 and as scheduled on 07/17 for repeat ultrasound, or prn

## 2024-06-09 ENCOUNTER — Encounter

## 2024-06-10 ENCOUNTER — Telehealth: Payer: Self-pay

## 2024-06-10 NOTE — Telephone Encounter (Signed)
 Writer called and spoke to patient offering to reschedule 06-09-24 no showed appt.  The patient agreed to schedule a OBC on 06-15-24 at 2:15p with Dr. Dorn.

## 2024-06-13 ENCOUNTER — Other Ambulatory Visit: Payer: Self-pay

## 2024-06-13 ENCOUNTER — Observation Stay
Admission: AD | Admit: 2024-06-13 | Discharge: 2024-06-14 | Disposition: A | Discharge status: Home / Self Care | Payer: MEDICAID | Source: Ambulatory Visit

## 2024-06-13 DIAGNOSIS — O99891 Other specified diseases and conditions complicating pregnancy: Principal | ICD-10-CM | POA: Insufficient documentation

## 2024-06-13 DIAGNOSIS — Z3A34 34 weeks gestation of pregnancy: Secondary | ICD-10-CM | POA: Insufficient documentation

## 2024-06-13 DIAGNOSIS — R82998 Other abnormal findings in urine: Secondary | ICD-10-CM | POA: Insufficient documentation

## 2024-06-13 DIAGNOSIS — R10819 Abdominal tenderness, unspecified site: Secondary | ICD-10-CM | POA: Insufficient documentation

## 2024-06-13 DIAGNOSIS — R079 Chest pain, unspecified: Secondary | ICD-10-CM | POA: Insufficient documentation

## 2024-06-13 DIAGNOSIS — R030 Elevated blood-pressure reading, without diagnosis of hypertension: Principal | ICD-10-CM

## 2024-06-13 DIAGNOSIS — R000 Tachycardia, unspecified: Secondary | ICD-10-CM

## 2024-06-13 DIAGNOSIS — R319 Hematuria, unspecified: Secondary | ICD-10-CM | POA: Insufficient documentation

## 2024-06-13 DIAGNOSIS — O1203 Gestational edema, third trimester: Secondary | ICD-10-CM | POA: Insufficient documentation

## 2024-06-13 DIAGNOSIS — R0602 Shortness of breath: Secondary | ICD-10-CM | POA: Insufficient documentation

## 2024-06-13 DIAGNOSIS — R8271 Bacteriuria: Secondary | ICD-10-CM | POA: Insufficient documentation

## 2024-06-13 DIAGNOSIS — O30043 Twin pregnancy, dichorionic/diamniotic, third trimester: Secondary | ICD-10-CM | POA: Insufficient documentation

## 2024-06-13 LAB — HOLD EXTRA URINE

## 2024-06-13 LAB — COMPREHENSIVE METABOLIC PANEL
ALT: 9 U/L (ref 0–35)
AST: 19 U/L (ref 0–35)
Albumin: 3.2 g/dL — ABNORMAL LOW (ref 3.5–5.2)
Alk Phos: 109 U/L — ABNORMAL HIGH (ref 35–105)
Anion Gap: 13 (ref 7–16)
Bilirubin,Total: 0.5 mg/dL (ref 0.0–1.2)
CO2: 18 mmol/L — ABNORMAL LOW (ref 20–28)
Calcium: 9.1 mg/dL (ref 8.8–10.2)
Chloride: 103 mmol/L (ref 96–108)
Creatinine: 0.61 mg/dL (ref 0.51–0.95)
Glucose: 72 mg/dL (ref 60–99)
Lab: 5 mg/dL — ABNORMAL LOW (ref 6–20)
Potassium: 4 mmol/L (ref 3.3–5.1)
Sodium: 134 mmol/L (ref 133–145)
Total Protein: 6.6 g/dL (ref 6.3–7.7)
eGFR BY CREAT: 119

## 2024-06-13 LAB — NT-PRO BNP: NT-pro BNP: <50 pg/mL (ref 0–450)

## 2024-06-13 LAB — PROTEIN,UR + CREAT,UR WITH RATIO
Creatinine,UR: 67 mg/dL (ref 20–300)
Protein,UR: 14 mg/dL — ABNORMAL HIGH (ref 0–11)
TP Creatinine ratio,UR: 0.21

## 2024-06-13 LAB — CBC
Hematocrit: 29 % — ABNORMAL LOW (ref 34–49)
Hemoglobin: 10 g/dL — ABNORMAL LOW (ref 11.2–16.0)
MCV: 94 fL (ref 75–100)
Platelets: 220 10*3/uL (ref 150–450)
RBC: 3.1 MIL/uL — ABNORMAL LOW (ref 4.0–5.5)
RDW: 13.6 % (ref 0.0–15.0)
WBC: 7.6 10*3/uL (ref 3.5–11.0)

## 2024-06-13 LAB — TROPONIN T 0 HR HIGH SENSITIVITY (IP/ED ONLY): TROP T 0 HR High Sensitivity: <6 ng/L (ref 0–11)

## 2024-06-13 LAB — EKG 12-LEAD
P: 29 deg
PR: 134 ms
QRS: 21 deg
QRSD: 87 ms
QT: 346 ms
QTc: 456 ms
Rate: 104 {beats}/min
T: 14 deg

## 2024-06-13 NOTE — OB Triage Note (Signed)
 OBSTETRICS EVALUATION      Referring Hospital (if applicable): {Referring Hospital:[id number]::Referring Hospital:}  Primary OB-GYN: ***    Chief Complaint: ***    HPI     Madison Rose is a 33 y.o. H2E8948 at [redacted]w[redacted]d by {ob dating:14516::LMP c/w ***w ultrasound} with pregnancy complicated by risks outlined below who presents ***.    Left sided flank pain - started yesterday constant pain; Never had pain like this before. Lingers. Pressure with urination and cramps. Some blood in her urine 3 days ago. Taking keflex  suppression daily.     Chest pressure states she feels like someone is sitting on her chest; feels it is worse when she lays flat and having SOB   No sick contacts   Never had this before   Pain is non radiating from front of chest to back of her chest     Started 3 days ago   Did not see anyone for it yet. It is getting worse     No resp viral symptoms, no fevers/chills     Couple of high blood pressures this pregnancy     Feet are swollen out of nowhere per patient just yesterday, feels like bees are stinging her     No VB, LOF, CTX, feeling baby A move normally but not baby B as much as normal.     Patient's medications, allergies, past medical, surgical, social and family histories were recorded, reviewed and updated as appropriate.     BP 140/83 on 4/30  Uspot 0.07 on 2/24       Pregnancy Risks     Dichorionic Diamniotic Twin Pregnancy   - USN: ***    AMA      UTI in pregnancy  -Klebsiella positive on 3/12, 3/27, 4/8   -s/p treatment with  Macrobid  (3/14-3/19), s/p cefpodixime (4/10-4/17)   -4/29 klebsiella 10-50k iso pyelonephritis, treated with IV CTX then keflex  suppression   -***     Limited prenatal care  -***     Sickle cell trait     SMA carrier screen positive     Hx of C/S  -records unavailable, 2009 in P6 at 40 weeks for fetal intolerance   -she has not discussed delivery planning with this pregnancy     Migraines     Hx of abdominoplasty     SMA carrier     Elevated early 1 hr GTT  144  - no subsequent testing     Anemia     Obstetrical History     OB History   Gravida Para Term Preterm AB Living   7 1 1  0 5 1   SAB IAB Ectopic Multiple Live Births   0 0 0 0 1      # Outcome Date GA Lbr Len/2nd Weight Sex Type Anes PTL Lv   7 Current            6 Term 10/20/08 [redacted]w[redacted]d  3610 g (7 lb 15.3 oz) F *CSLT Spinal N LIV      Complications: Fetal Intolerance   5 AB      TAB      4 AB      SAB      3 AB      SAB      2 AB      TAB      1 AB      TAB         Obstetric Comments   12/23/23: No carrier  screening results found.   Pt states she is a carrier of sickle cell trait. States the FOB is not.    G7 P1051 is correct, does not wish to add dates of prior TABs/SABs         Prenatal Labs     No results for input(s): WBC, HGB, HCT, PLT in the last 168 hours.  No results for input(s): NA, K, CL, CO2, UN, CREAT, GFRC, GLU in the last 168 hours. No results for input(s): LD, URIC, ALT, AST, ALK, TB in the last 168 hours.   No results for input(s): UTPR, UCRR in the last 168 hours.    Urine spot P/C ratio:      Lab results: 02/08/24  1038   TP Creatinine ratio,UR 0.07             Lab results: 06/01/24  0932 05/11/24  2331 04/12/24  1952 03/22/24  1613 02/29/24  1158   ABO RH Blood Type  --   --  O RH POS  --  O RH POS   Rubella IgG AB  --   --   --   --  0.8   Group B Strep Culture  --  .  --   --   --    Syphilis Screen  --   --  Neg  --  Neg   HIV 1&2 ANTIGEN/ANTIBODY  --   --   --   --  Nonreactive   HBV S Ag  --   --   --   --  NEG   N. gonorrhoeae NAAT (PCR) NEGATIVE  --   --    < >  --    Chlamydia NAAT (PCR) NEGATIVE  --   --    < >  --     < > = values in this interval not displayed.        Lab results: 02/29/24  1158   Glucose,50gm 1HR 144*      No results for input(s): GL0, GL1H, GL2H, GL3H in the last 8760 hours.        Prenatal Ultrasound Review (Pertinent Findings):  6/20:       Physical Exam     There were no vitals filed for this visit.    {Choose how  you'd like to document your physical zkjf!:[id number]}     {Cervical Exam (Optional will disappear if not selected when signing note!):999004089}    {Sterile Spec Exam  (Optional - if not filled out will disappear upon note signing):[phone]::Sterile Speculum Exam:}    {Wet Prep Results (Optional):[phone]::Wet Prep: }    Estimated Fetal Weight: *** grams by {Fetal Weight Estimation Method:999004091}    Presentation: {obgyn fetal presentation:315498} by ultrasound    Placental location: {ob gyn placenta location:312808}      Fetal Monitoring:  Baseline: *** bpm  Variability: {variability:304455005}  Accelerations: {ACCELERATIONS:34027}  Decelerations: {Decelerations:[phone]}  Category: ***  Toco: {Tocometer:999004096}      Assessment & Plan     {Choose which A&P you'd like for this visit!:999004092}

## 2024-06-14 ENCOUNTER — Other Ambulatory Visit: Payer: Self-pay

## 2024-06-14 DIAGNOSIS — R109 Unspecified abdominal pain: Secondary | ICD-10-CM

## 2024-06-14 DIAGNOSIS — O26893 Other specified pregnancy related conditions, third trimester: Secondary | ICD-10-CM

## 2024-06-14 LAB — URINALYSIS WITH MICROSCOPIC
Glucose,UA: NEGATIVE
Ketones, UA: NEGATIVE
Nitrite,UA: NEGATIVE
Protein,UA: NEGATIVE
Specific Gravity,UA: 1.009 (ref 1.002–1.030)
pH,UA: 7 (ref 5.0–8.0)

## 2024-06-14 MED ORDER — CEFTRIAXONE IN LIDOCAINE 1% 350 MG/ML IM *I*
1000.0000 mg | Freq: Once | INTRAMUSCULAR | Status: AC
Start: 2024-06-14 — End: 2024-06-14
  Administered 2024-06-14: 1000 mg via INTRAMUSCULAR
  Filled 2024-06-14: qty 1360.95

## 2024-06-14 MED ORDER — FAMOTIDINE 20 MG PO TABS *I*
20.0000 mg | ORAL_TABLET | Freq: Once | ORAL | Status: AC
Start: 2024-06-14 — End: 2024-06-14
  Administered 2024-06-14: 20 mg via ORAL
  Filled 2024-06-14: qty 1

## 2024-06-14 MED ORDER — CEPHALEXIN 500 MG PO CAPS *I*
500.0000 mg | ORAL_CAPSULE | Freq: Four times a day (QID) | ORAL | 0 refills | Status: AC
Start: 2024-06-14 — End: 2024-06-21
  Filled 2024-06-14: qty 28, 7d supply, fill #0

## 2024-06-14 NOTE — OB Triage Note (Signed)
 Pt into triage with c/o SOB, chest pain, and lower back pain, pt endorses - VB, - LOF, + FM, - ctxs. VSS, AOx 3, CEFM applied, both FHR A&B reassuring in 140s. Pt oriented to unit and call bell system, pt rights provided. Dr. Arie notified and in to see pt.    Lyle Gerold, RN

## 2024-06-14 NOTE — Discharge Instructions (Signed)
 Call or return to triage if you have worsening of any of your symptoms, if you have more than 6 contractions in an hour, if you have vaginal bleeding or leakage of clear fluid, or if you don't feel your baby moving.    If you have hypertension and take your blood pressures at home call or return to triage if you have blood pressures with systolic (top number) >160 or diastolic (bottom number) >110 or if you develop severe headache, visual changes, chest pain, shortness of breath, or upper right sided abdominal pain.     Take Keflex  antibiotic 500 mg 4 times daily for 7 days, then transition back to suppression dosing (once daily).     If you cannot reach your OB/Gyn or Midwife, call the Labor and Delivery Unit or 911 if it is an emergency.    Follow-up with your [name] provider as previously instructed.

## 2024-06-15 ENCOUNTER — Encounter: Admitting: Obstetrics and Gynecology

## 2024-06-15 LAB — AEROBIC BACTERIAL URINE CULTURE

## 2024-06-19 NOTE — Progress Notes (Unsigned)
 [facility], Obstetrics and Gynecology    Prenatal Visit:      Paraskevi Funez is a  33 y.o., H2E8948 presenting for routine OBC at [redacted]w[redacted]d.     The patient has as schedule c/s on 07/08/24.     She is here with her female partner for the visit today.     Early 1 hour GTT elevated and Early 3 hour GTT outstanding.  Pt did not complete second trimester screening for GDM. I have the testing strips, all my sugars are normal when I check them..      Pt reports good fetal movements.  Patient denies any contractions, leaking of fluid, vaginal bleeding, abnormal vaginal discharge.      Reports continued back pain and lower extremity edema.   Is wearing knee high compression socks.  Is not wearing maternity support belt, notes rest relieves back pain the most.     Pt denies headaches, visual changes, epigastric pain, edema, shortness of breath or chest pain.     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    May be eligible for HPV vaccination PPM Z23    Fall W19.XXXA    Anemia in pregnancy, second trimester O99.012    Elevated glucose tolerance test R73.09    H/O sickle cell trait Z86.2    AMA (advanced maternal age) multigravida 35+ O09.529    Abdominal pain affecting pregnancy O26.899, R10.9    Heartburn during pregnancy O26.899, R12    Pyelonephritis affecting pregnancy O23.00    Pyelonephritis N12    Pelvic pain R10.2    Breech presentation, fetus 1 of multiple gestation O80.1XX1    History of cesarean section complicating pregnancy O34.219       Problem list, medical, family & social history, medications, and allergies were reviewed and updated as appropriate today.    Vitals:  BP: 120/74    Height: 175.3 cm (5' 9)  Weight: 122.7 kg (270 lb 6.4 oz)    Total Weight Gain: 22 kg (48 lb 6.4 oz)   Fetal Movements: Present in both Baby A and Baby B  Fundal Height (cm): 45 cm  Fetal Heart Rate: Baby A 142 (Baby B 130)       Urine  POCT:          Exam:             Lower Extremity Edema: 1+ non- pitting edema      Plan:  Common discomforts of the third trimester, kick counts, s/s of preterm labor, s/s of preeclampsia, and third trimester warning signs reviewed with patient today.  Patient to notify [facility] with any third trimester warning signs, s/s or symptoms of preterm labor, s/s of preeclampsia, decreased fetal movement,  or concerns.   Rationale for GBS testing at 36 week visit reviewed with patient today.        AMA   SMA carrier screen positive     Pt was evaluated by [facility] on 03/12  Pt declined genetics testing/screening at that visit.      UTI in pregnancy  -Klebsiella positive on 3/12, 3/27, 4/8   -s/p treatment with  Macrobid  (3/14-3/19), s/p cefpodixime (4/10-4/17)   -4/29 klebsiella 10-50k iso pyelonephritis, treated with IV CTX then keflex  suppression   -Currently taking keflex  suppression 500 mg daily      Sickle cell trait  Hx of C/S  -records unavailable, 2009 in P6 at 40 weeks for fetal intolerance   -she has not discussed delivery planning with this pregnancy     Migraines     Hx of abdominoplasty     SMA carrier     Elevated early 1 hr GTT 144  - no subsequent testing     Anemia   4/30: 9.5/28 > 7/1 10.0/29       Pelvic pain affecting pregnancy in third trimester/Back pain affecting pregnancy  - maternity support belt RX provided to patient at a past visit.   - comfort measures reviewed      Edema during pregnancy in third trimester  - encouraged pt to increase PO hydration  - compression socks knee high; Wear fitted below the knee  Dispense: 4 each; Refill: 0     Growth discordance     Ultrasound on 06/20 for BPP showed the following:     - A: BPP 8/10, AFI normal, EFW 1968g, 32%ile, in vertex position  - B: BPP 10/10, AFI normal, EFW 2381g, 84%ile. In breech position  17% growth discordance between the dichorionic twins.     Dichorionic diamniotic twin pregnancy in third trimester (Primary)  -  Weekly NST to begin this week.   - Fetal nonstress test; Standing  - OB Ultrasound BPP for non-reactive NST; Standing  - OB Ultrasound for variables on NST; Standing        Follow-up:   Return to the office, this week for NST, in 1 week for MD or resident for [name], a scheduled for ultrasound on 07/17, or prn.    Corissa Oguinn Lynn Sammantha Mehlhaff, NP

## 2024-06-20 ENCOUNTER — Other Ambulatory Visit: Payer: Self-pay

## 2024-06-20 ENCOUNTER — Ambulatory Visit: Payer: MEDICAID

## 2024-06-20 VITALS — BP 120/74 | HR 100 | Ht 69.0 in | Wt 270.4 lb

## 2024-06-20 DIAGNOSIS — O30043 Twin pregnancy, dichorionic/diamniotic, third trimester: Secondary | ICD-10-CM | POA: Insufficient documentation

## 2024-06-20 DIAGNOSIS — O0993 Supervision of high risk pregnancy, unspecified, third trimester: Secondary | ICD-10-CM | POA: Insufficient documentation

## 2024-06-20 NOTE — Patient Instructions (Signed)
Kick Count Instructions              A kick count is simply a record of your baby's movements inside of you.  Healthy babies are usually active.  Keeping a kick count will help you check on your baby's daily health.    Instructions:    Make sure you have had some recent food or drink  Lay down in a quiet place on your side (left or right).  I should be about the same time every day when you know your baby is usually active.  Note the time  Concentrate (do not read, watch TV, etc).  Place your hands on top of your uterus.   Each time you feel a movement put a check (?) in the box.  Remember, a movement can be a kick, roll swish or flutter.  After you have checked all ten boxes, note the time again.  If you are not feeling movement, perhaps your baby is just sleeping.  Change your position, gently shake your abdomen, or move your baby with your hands to wake up the baby.  If your baby still does not move, or if it takes longer than 2 hours to complete your kick count, call your provider immediately.  Bring your kick count to your next appointment.    Kick Count Record    Date Time Started Kicks Time Finished   Ex. 9/5 8:15 PM    ? ? ? ? ? ? ? ? ? ? 9:00 PM            ? ? ? ? ? ? ? ? ? ?          ? ? ? ? ? ? ? ? ? ?          ? ? ? ? ? ? ? ? ? ?          ? ? ? ? ? ? ? ? ? ?          ? ? ? ? ? ? ? ? ? ?          ? ? ? ? ? ? ? ? ? ?          ? ? ? ? ? ? ? ? ? ?          ? ? ? ? ? ? ? ? ? ?          ? ? ? ? ? ? ? ? ? ?          ? ? ? ? ? ? ? ? ? ?          ? ? ? ? ? ? ? ? ? ?          ? ? ? ? ? ? ? ? ? ?          ? ? ? ? ? ? ? ? ? ?          ? ? ? ? ? ? ? ? ? ?          ? ? ? ? ? ? ? ? ? ?          ? ? ? ? ? ? ? ? ? ?         ? ? ? ? ? ? ? ? ? ?         ? ? ? ? ? ? ? ? ? ?          ? ? ? ? ? ? ? ? ? ?          ? ? ? ? ? ? ? ? ? ?          ? ? ? ? ? ? ? ? ? ?       Signs of Labor                                 Is there any way to predict when I'm going to go into labor?  Not really. Experts don't fully understand what triggers the onset of labor, and there's no way to predict exactly when it will start. Your body actually starts preparing for labor up to a month before you give birth. You may be blissfully unaware of what's going on -- or you may begin to notice new symptoms as your due date draws near. Here are some things that may happen in the weeks or days before labor starts:    Your baby "drops."  If this is your first pregnancy, you may feel what's known as "lightening" a few weeks before labor starts. You might detect heaviness in your pelvis as this happens and notice less pressure just below your ribcage, making it easier to catch your breath.    You note more Braxton Hicks contractions.  More frequent and intense Braxton Hicks contractions can signal pre-labor, during which your cervix ripens (see below) and the stage is set for true labor. Some women experience a crampy, menstrual-like feeling during this time.  Sometimes, as true labor draws near, Braxton Hicks contractions become relatively painful and strike as often as every ten to 20 minutes, making you wonder whether true labor has started. But if the contractions don't get longer, stronger, and closer together and cause your cervix to dilate progressively, then what you're feeling is probably so-called false labor.    Your cervix starts to ripen.  In the days and weeks before delivery, Braxton Hicks contractions may do the preliminary work of softening, thinning, and perhaps opening your cervix a bit. (If you've given birth before, your cervix is more likely to dilate a centimeter or two before labor starts, but keep in mind that even being [redacted] weeks pregnant with your first baby and 1 centimeter dilated is no guarantee that labor is imminent.)  When you're at or near your due date, your practitioner may  do a vaginal exam during your prenatal visit to see whether your cervix has started to change.    You pass your mucus plug or notice "bloody show."  You may pass your mucus plug -- the small amount of thickened mucus that has sealed your cervical canal during the last nine months -- if your cervix begins to dilate as you get close to labor.    The plug may come out in a lump or as increased vaginal discharge over the course of several days. The mucus may be tinged with brown, pink, or red blood, which is why it's referred to as "bloody show." Having sex or a vaginal exam can also disturb your mucus plug and cause you to see some blood-tinged discharge, even when labor isn't going to start in the next few days.    Your water breaks.  When the fluid-filled amniotic sac surrounding your baby ruptures, fluid leaks from your vagina. And whether it comes out in a large gush or a small trickle, you should call your doctor or midwife.    Most women start having regular contractions before their water breaks, but in some cases, the water breaks first. When this happens, labor usually follows soon. If you don't start having contractions on your own within a certain amount of time, you'll need to be   induced, since your baby's more likely to get an infection without the amniotic sac's protection against germs.    How can I tell whether my labor has actually started?  It's often not possible to pinpoint exactly when "true" labor begins because early labor contractions might start out feeling like the Braxton Hicks contractions you may have been noticing for weeks.    It's likely that labor is under way, however, when your contractions become increasingly longer, stronger, and closer together. They may be as far apart as every ten minutes or so in the beginning, but they won't stop or ease up no matter what you do. And in time, they'll become more painful and closer together.    In some cases, though, the onset of strong, regular  contractions comes with little or no warning. It's different for every woman and with every pregnancy.    When should I call my doctor or midwife?  Toward the end of your pregnancy, your practitioner should give you a clear set of guidelines for when to let her know that you're having contractions and at what point she'll want you to go to the hospital or birth center.  These instructions will depend on your individual situation -- whether you have pregnancy complications or are otherwise considered high-risk, whether this is your first baby, and practical matters like how far you live from the hospital or birth center -- as well as on your caregiver's personal preference (some prefer an early heads-up).  If your pregnancy is uncomplicated, she'll probably have you wait to come in until you've been having contractions that last for about a minute each, coming every five minutes for about an hour. (You time a contraction from the beginning of one to the beginning of the next one.) As a rule, if you're high-risk, she'll want to hear from you earlier in labor.  Don't be afraid to call if the signs aren't clear but you think the time may have come. Doctors and midwives are used to getting calls from women who aren't sure whether they're in labor and need guidance. It's part of their job.  And the truth is, your caregiver can tell a lot by the sound of your voice, so verbal communication helps. She'll want to know how close together your contractions are, how long each one lasts, how strong they are (she'll note whether you can talk through a contraction), and any other symptoms you may have.  Finally, whether or not your pregnancy has been problem-free up to now, and whether or not you think you might be in labor, be sure to call your caregiver right away (and if you can't reach her, head for the hospital) in the following situations:     Your water breaks or you suspect that you're leaking amniotic fluid. Tell your  practitioner if it's yellow, brown, or greenish, because this signals the presence of meconium, your baby's first stool, which is sometimes a sign of fetal stress. It's also important to let her know if the fluid looks bloody.     You notice that your baby is less active.     You have vaginal bleeding (unless it's just bloody show -- mucus with a spot or streak of blood), constant severe abdominal pain, or fever.     You start having contractions before 37 weeks or any other signs of labor. You have severe or persistent headaches, vision changes, intense pain or tenderness in your upper abdomen, abnormal swelling, or any   other symptoms of preeclampsia.    Some women assume that various symptoms are just part and parcel of being pregnant, while others worry that every new symptom spells trouble. Knowing which pregnancy symptoms you should never ignore can help you decide when to call your caregiver.    That said, every pregnancy is different and no list can cover all situations, so if you're not sure whether a symptom is serious, or if you just don't feel like yourself or are uneasy, trust your instincts and call your healthcare provider. If there's a problem, you'll get help. If nothing's wrong, you'll be reassured.    Reviewed 10/2011Common Discomforts: THIRD TRIMESTER    The last few weeks of your pregnancy are often the most uncomfortable.  Your growing baby is taking up more and more space.   Carrying around all that extra weight can make you more tired than usual.  As the baby and your uterus press up, down and out, other discomforts arise.     What are the most common discomforts during this last part of my pregnancy?  Most women have some or all of the following discomforts: edema (swelling), insomnia (unable to get to sleep or stay asleep), uncomfortable intercourse (sex), going to the bathroom a lot, shortness of breath and numbness or tingling in the fingers.   Discomforts you developed in your second  trimester, such as leg cramps, constipation or hemorrhoids, may continue, get better or get worse.  Every woman is different and every pregnancy is different.     What can I do to prevent or relieve any discomforts?  No matter how careful you are, it is almost impossible to avoid all discomforts during your pregnancy.  Face it--carrying around a 6-10 pound baby and all that goes with it (placenta, uterus, bag of waters) in the tight space between your rib cage and your hips is bound to be a bit of a hassle!  There are many things you can do to make these last weeks easier on yourself.  This guide looks at each of most common discomforts.     Edema  Edema is swelling of any part of your body.  Your feet, ankles and hands most commonly swell during late pregnancy.  Sometimes swelling can be a sign of a problem.  (See the guide on Warning Signs-Second Trimester).  Sudden swelling, especially in your face or upper body, can be a warning sign.  Call your health care provider (HCP) right away if you have swelling in your face or above your waist.     Swelling also happens because your hormones make your blood vessels leak.  Also, the pressure of your baby on your hips sometimes keeps your blood from flowing well in your legs.  That is why your feet and ankles may swell.  Eating normal amounts of salt on your food will not cause this, but taking salt pills or eating a lot of salt might make edema worse.    Here are some things to do that can help:   Try not to wear tight clothing.  Tight waistbands or knee-high and thigh-high socks are especially a problem.  Take rest periods often. Raise your legs higher than your heart if possible.  At the very least, put your feet up so they are level with your hips.  Use support panty hose to help the blood flow in your legs  Watch the amount of salt and salty foods in your diet.   Call   your HCP if the swelling keeps getting worse or happens very quickly.     Insomnia    Insomnia is  not being able to sleep.  Sometimes people have trouble falling asleep.  Other people fall asleep, but wake up in the middle of the night and cannot get back to sleep.  Most women have changes in their sleep patterns during pregnancy.  Sometime the size of the baby makes it hard to find a comfortable position.   Pressure on your bladder may make you have to get up to go the bathroom at night.  Stress worrying about things and being anxious can also keep you from sleeping well.    Here are some things that might help you sleep better:  Avoid exciting activities before bedtime.   No relay races or scary movies!  Take a warm bath.  Try the side-lying position for rest and relaxation.  Some women prefer to support the upper leg with pillows.  Read a dull book.  Have someone give you a back rub or massage!  Exercise before dinner or at least 3 hours before bedtime.   Take a short nap during the day so you are not overly tired.  Try not to drink things with caffeine, like coffee, tea, cola or other soda (read the labels).  Sip on a glass of milk before bed or have a small bowl of cereal with milk.   Keep a pad by your bed and write down things that are bothering you.  Some women cannot get to sleep for fear they will forget what they are thinking about.     If there are things that are worrying you, talk to someone about your worries.  Your HCP might be able to give you some other ideas too!    Discomforts during sex    During this last trimester, it can be more challenging to enjoy "being with" your special someone.  Lots of things make a difference in how much you enjoy sex.   Pressure from the growing baby and changes in your vagina make sex feel different.  Your larger belly can get in the way! How you feel and think can change your feelings about sex too.  It is okay to have sex, usually all the way up until your baby's birth.   You will not hurt the baby!    In some special cases, your HCP may tell you not to have  sex or even sex play especially playing with nipples (this can lead to preterm labor).  Except in these special cases, though, you can enjoy sex as much as you and your partner like.        Here are some things that can help make you more comfortable:  Try different positions.  Lying on your side of often helps.  Use a water soluble vagina gel, like K-Y Jelly, to keep things slippery.   Do not use petroleum jelly or mineral oil.  Look for water soluble on the package.  Talk about any fears or concerns you have with your partner or your HCP.   Tell your HCP if you have any symptoms of a vaginal infection (itching, burning, pain with sex, strange discharge).  Be sure to follow all instructions for treatment.   Find other ways to express your feelings.  Try new ways to give your partner pleasure if the sex act itself gets too uncomfortable.  See if there are ways he can make you   feel better without sex too.    Frequent urination    You may find yourself having to go to the bathroom a lot more often now.  The baby's head is pushing your bladder (urine storage area).  Your kidneys are also making more urine these days, because you have more blood flowing through them.    Sometimes you develop a urinary infection.   If it happens, you will also find yourself    urinating more often.  But you may also notice burning when you go and your urine may be cloudy or smell different than usual.  If any of these things happen, tell your HCP.  There are medicines that can help get rid of the infection and make you feel better.     Things you can do to be more comfortable are:  Drink fluids often.  Do not cut back on fluids.  Do not drink all your liquids for 1 day at the same time.  Try carrying a large cup of water with you and sip on it often.   Do not drink liquids with caffeine in them.  Too much caffeine may not be good for the baby.  Caffeine also makes you have to urinate more often.  Coffee and tea have caffeine--even the  decaffeinated kind has a little.   Colas and other dark sodas often have caffeine.  Even some "clear" sodas have caffeine, so read the labels to see if caffeine is listed in the ingredients.  Do Kegel exercises.   Squeeze the muscles in your bottom, then release.   Try to do 10 squeezes every time you think of it.   This will make it easier for you to hold your urine until you can get to a bathroom.    Shortness of breath    A feeling of having a hard time catching your breath occurs in this last trimester.  This is caused by the baby getting bigger and pushing up on your ribs and lungs.  This makes it hard for your lungs to stretch and let you take a deep breath.  Sometimes other problems can make this feeling worse.   If you have a cold with a fever, or if you have had other problems with your heart and lungs, tell your HCP if you get short of breath.  Otherwise here are some ways to breath easier:  Do not do anything that you find makes you short of breath!  Try not to bend over for long periods.  Pace your exercise and walking so you do not have trouble catching your breath.  Take stairs more slowly.  Do not wear clothing that is too tight.  Make sure you get larger bras and blouses as you chest gets larger.   If lying down makes it harder to breathe, add extra pillows under your head and back at night.   Split up your activities to include more rest breaks.     Numbness or tingling in the fingers    Some women have strange feelings of numbness or tingling in their fingers toward the end of their pregnancy.  This can come from a change in your posture that puts pressure on the nerves to your hand and arms.  It is more common at night and in the early morning. Moving around or stretching often makes it go away.      It is not normal to have pain, loss of sensation where you cannot feel anything,   or numbness that affects your whole hand or wrist.  Let your HCP know if this happens.   If the numbness happens along  with swelling, especially first thing in the morning, call your HCP right away.     For normal numbness or tingling, some things that might help include the following:    Try to keep your back straight.  Do not slouch.   Raise your hand or hands over your head for a few minutes when numbness or tingling occurs.   Do stretching and relaxation exercises for your shoulders, arms and hands.  Sleep with your hands and forearms up on a pillow.    If you use a computer a lot, use a wrist pad when typing.   Ask your HCP about using a wrist splint if the problem does not get better.     Most women have some amount of discomfort as their pregnancy reaches the final weeks.   Talk to your HCP about any discomforts or concerns you have.  Remember--you are now only a few short weeks away from having that baby in your arms.     Reviewed 09/2010

## 2024-06-21 ENCOUNTER — Ambulatory Visit (HOSPITAL_COMMUNITY)
Admission: RE | Admit: 2024-06-21 | Discharge: 2024-06-21 | Disposition: A | Discharge status: Home / Self Care | Source: Ambulatory Visit | Attending: Family Medicine | Admitting: Family Medicine

## 2024-06-21 ENCOUNTER — Encounter (HOSPITAL_COMMUNITY): Payer: Self-pay

## 2024-06-21 ENCOUNTER — Other Ambulatory Visit: Payer: Self-pay

## 2024-06-21 VITALS — BP 150/107 | HR 99 | Temp 98.9°F | Resp 16

## 2024-06-21 DIAGNOSIS — I10 Essential (primary) hypertension: Secondary | ICD-10-CM | POA: Diagnosis not present

## 2024-06-21 DIAGNOSIS — M545 Low back pain, unspecified: Secondary | ICD-10-CM

## 2024-06-21 DIAGNOSIS — M62830 Muscle spasm of back: Secondary | ICD-10-CM

## 2024-06-21 MED ORDER — METHOCARBAMOL 500 MG PO TABS: 1000.0000 mg | ORAL_TABLET | Freq: Two times a day (BID) | ORAL | 0 refills | Status: DC | PRN

## 2024-06-21 NOTE — ED Triage Notes (Signed)
 Patient here today with c/o left side low back pain X 1 week. She tried taking Tylenol  with no relief. Bending over increases the pain. No known injury. Patient states that she has some scoliosis.

## 2024-06-21 NOTE — Discharge Instructions (Signed)
 HOME CARE INSTRUCTIONS: For many people, back pain returns. Since low back pain is rarely dangerous, it is often a condition that people can learn to manage on their own. Please remain active. It is stressful on the back to sit or stand in one place. Do not sit, drive, or stand in one place for more than 30 minutes at a time. Take short walks on level surfaces as soon as pain allows. Try to increase the length of time you walk each day. Do not stay in bed. Resting more than 1 or 2 days can delay your recovery. Do not avoid exercise or work. Your body is made to move. It is not dangerous to be active, even though your back may hurt. Your back will likely heal faster if you return to being active before your pain is gone. Over-the-counter medicines to reduce pain and inflammation are often the most helpful.  SEEK MEDICAL CARE IF: You have pain that is not relieved with rest or medicine. You have pain that does not improve in 1 week. You have new symptoms. You are generally not feeling well.  SEEK IMMEDIATE MEDICAL CARE IF: You have pain that radiates from your back into your legs. You develop new bowel or bladder control problems. You have unusual weakness or numbness in your arms or legs. You develop nausea or vomiting. You develop abdominal pain. You feel faint.   Your blood pressure was noted to be elevated during your visit today. If you are currently taking medication for high blood pressure, please ensure you are taking this as directed. If you do not have a history of high blood pressure and your blood pressure remains persistently elevated, you may need to begin taking a medication at some point. You may return here within the next few days to recheck if unable to see your primary care provider or if you do not have a one.  BP (!) 150/107 (BP Location: Left Arm)   Pulse 99   Temp 98.9 F (37.2 C) (Oral)   Resp 16   LMP 06/01/2024 (Exact Date)   SpO2 98%   Breastfeeding No   BP  Readings from Last 3 Encounters:  06/21/24 (!) 150/107  10/06/23 (!) 140/90  06/16/22 140/90

## 2024-06-21 NOTE — Progress Notes (Unsigned)
 Called patient to review need for [facility] with MD next week.     Reviewed importance of OBC and next steps at that visit.     Pt agreeable to come in prior to NST on 07/01/24.     Message sent to [facility]. [place] to double book pt with resident.     Writer will notify patient via mychart and text of appointment time.     Pt verbalized understanding of all and will plan to come to NST this Friday. 06/24/24 and next weeks USN on 06/30/24, and NST/OBC on 07/01/24.     No further questions or concerns.

## 2024-06-22 NOTE — ED Provider Notes (Signed)
 [name]ENTER   [id number] 06/21/24 Arrival Time: 1754  ASSESSMENT & PLAN:  1. Acute left-sided low back pain without sciatica   2. Muscle spasm of back   3. Elevated blood pressure reading with diagnosis of hypertension    Able to ambulate here and hemodynamically stable. No indication for imaging of back at this time given no trauma and normal neurological exam. Discussed.  Meds ordered this encounter  Medications   methocarbamol  (ROBAXIN ) 500 MG tablet    Sig: Take 2 tablets (1,000 mg total) by mouth 2 (two) times daily as needed for muscle spasms.    Dispense:  20 tablet    Refill:  0   Work/school excuse note: declined. Medication sedation precautions given. Encourage ROM/movement as tolerated. May f/u here to recheck BP.  Recommend:  Follow-up Information     [facility].   Why: If worsening or failing to improve as anticipated. Contact information: [address] [place]  [address] [phone]                 Discharge Instructions      HOME CARE INSTRUCTIONS: For many people, back pain returns. Since low back pain is rarely dangerous, it is often a condition that people can learn to manage on their own. Please remain active. It is stressful on the back to sit or stand in one place. Do not sit, drive, or stand in one place for more than 30 minutes at a time. Take short walks on level surfaces as soon as pain allows. Try to increase the length of time you walk each day. Do not stay in bed. Resting more than 1 or 2 days can delay your recovery. Do not avoid exercise or work. Your body is made to move. It is not dangerous to be active, even though your back may hurt. Your back will likely heal faster if you return to being active before your pain is gone. Over-the-counter medicines to reduce pain and inflammation are often the most helpful.  SEEK MEDICAL CARE IF: You have pain that is not relieved with  rest or medicine. You have pain that does not improve in 1 week. You have new symptoms. You are generally not feeling well.  SEEK IMMEDIATE MEDICAL CARE IF: You have pain that radiates from your back into your legs. You develop new bowel or bladder control problems. You have unusual weakness or numbness in your arms or legs. You develop nausea or vomiting. You develop abdominal pain. You feel faint.   Your blood pressure was noted to be elevated during your visit today. If you are currently taking medication for high blood pressure, please ensure you are taking this as directed. If you do not have a history of high blood pressure and your blood pressure remains persistently elevated, you may need to begin taking a medication at some point. You may return here within the next few days to recheck if unable to see your primary care provider or if you do not have a one.  BP (!) 150/107 (BP Location: Left Arm)   Pulse 99   Temp 98.9 F (37.2 C) (Oral)   Resp 16   LMP 06/01/2024 (Exact Date)   SpO2 98%   Breastfeeding No   BP Readings from Last 3 Encounters:  06/21/24 (!) 150/107  10/06/23 (!) 140/90  06/16/22 140/90         Reviewed expectations re: course of current medical issues. Questions answered.  Outlined signs and symptoms indicating need for more acute intervention. Patient verbalized understanding. After Visit Summary given.   SUBJECTIVE: History from: patient.  Robin Gallegos is a 33 y.o. female who presents with complaint of LEFT low back pain; x 1 week; gradual onset; non-radiating; denies injury/trauma. Pain worse when bending forward at waist. Normal bowel/bladder habits. Normal ambulation. Denies extremity sensation changes or weakness. Tylenol  with minimal help. Reports no chronic steroid use, fevers, IV drug use, or recent back surgeries or procedures.    OBJECTIVE:  Vitals:   06/21/24 1822  BP: (!) 150/107  Pulse: 99  Resp: 16  Temp: 98.9 F (37.2  C)  TempSrc: Oral  SpO2: 98%    General appearance: alert; no distress HEENT: [place]; AT Neck: supple with FROM; without midline tenderness CV: regular Lungs: unlabored respirations; speaks full sentences without difficulty Abdomen: soft, non-tender; non-distended Back: mild  and poorly localized tenderness to palpation over left lumbar paraspinal musculature; FROM at waist; bruising: none; without midline tenderness Extremities: without edema; symmetrical without gross deformities; normal ROM of bilateral LE Skin: warm and dry Neurologic: normal gait; normal sensation and strength of bilateral LE Psychological: alert and cooperative; normal mood and affect .  Allergies  Allergen Reactions   Penicillins Hives    Did it involve swelling of the face/tongue/throat, SOB, or low BP? Unknown Did it involve sudden or severe rash/hives, skin peeling, or any reaction on the inside of your mouth or nose? Unknown Did you need to seek medical attention at a hospital or doctor's office? Unknown When did it last happen?     pt was a child  If all above answers are "NO", may proceed with cephalosporin use.    Cleocin  [Clindamycin ] Rash    Past Medical History:  Diagnosis Date   CHF (congestive heart failure) (HCC)    Pre-eclampsia    Social History   Socioeconomic History   Marital status: Single    Spouse name: Not on file   Number of children: Not on file   Years of education: Not on file   Highest education level: Not on file  Occupational History   Not on file  Tobacco Use   Smoking status: Never   Smokeless tobacco: Never  Vaping Use   Vaping status: Never Used  Substance and Sexual Activity   Alcohol use: Not Currently   Drug use: Not Currently   Sexual activity: Yes  Other Topics Concern   Not on file  Social History Narrative   Not on file   Social Drivers of Health   Financial Resource Strain: Not on file  Food Insecurity: Not on file  Transportation Needs: Not on  file  Physical Activity: Not on file  Stress: Not on file  Social Connections: Not on file  Intimate Partner Violence: Not on file   Family History  Problem Relation Age of Onset   Hypertension Mother    Heart failure Father    Hypertension Sister    Hypertension Sister    Hypertension Sister    History reviewed. No pertinent surgical history.    Rolinda Rogue, MD 06/22/24 1037

## 2024-06-24 ENCOUNTER — Ambulatory Visit: Payer: MEDICAID

## 2024-06-24 ENCOUNTER — Other Ambulatory Visit: Payer: Self-pay

## 2024-06-24 VITALS — BP 116/79 | HR 117

## 2024-06-24 DIAGNOSIS — O99213 Obesity complicating pregnancy, third trimester: Secondary | ICD-10-CM | POA: Insufficient documentation

## 2024-06-24 DIAGNOSIS — O09523 Supervision of elderly multigravida, third trimester: Secondary | ICD-10-CM | POA: Insufficient documentation

## 2024-06-24 DIAGNOSIS — E669 Obesity, unspecified: Secondary | ICD-10-CM

## 2024-06-24 DIAGNOSIS — O368330 Maternal care for abnormalities of the fetal heart rate or rhythm, third trimester, not applicable or unspecified: Secondary | ICD-10-CM

## 2024-06-24 DIAGNOSIS — Z6839 Body mass index (BMI) 39.0-39.9, adult: Secondary | ICD-10-CM | POA: Insufficient documentation

## 2024-06-24 DIAGNOSIS — O24419 Gestational diabetes mellitus in pregnancy, unspecified control: Secondary | ICD-10-CM | POA: Insufficient documentation

## 2024-06-24 DIAGNOSIS — Z3A36 36 weeks gestation of pregnancy: Secondary | ICD-10-CM

## 2024-06-24 DIAGNOSIS — O26899 Other specified pregnancy related conditions, unspecified trimester: Secondary | ICD-10-CM | POA: Insufficient documentation

## 2024-06-24 DIAGNOSIS — O34211 Maternal care for low transverse scar from previous cesarean delivery: Secondary | ICD-10-CM | POA: Insufficient documentation

## 2024-06-24 DIAGNOSIS — D251 Intramural leiomyoma of uterus: Secondary | ICD-10-CM

## 2024-06-24 DIAGNOSIS — R109 Unspecified abdominal pain: Secondary | ICD-10-CM | POA: Insufficient documentation

## 2024-06-24 DIAGNOSIS — O26893 Other specified pregnancy related conditions, third trimester: Secondary | ICD-10-CM | POA: Insufficient documentation

## 2024-06-24 DIAGNOSIS — R030 Elevated blood-pressure reading, without diagnosis of hypertension: Secondary | ICD-10-CM | POA: Insufficient documentation

## 2024-06-24 DIAGNOSIS — O30043 Twin pregnancy, dichorionic/diamniotic, third trimester: Secondary | ICD-10-CM

## 2024-06-24 NOTE — Procedures (Signed)
 Fetal Non-Stress Test    Patient: Madison Rose Age: 33 y.o.  Date of Birth: July 18, 1991  [id number]  eMRN: [id number]  GA: [redacted]w[redacted]d  Maternal          Reason For Exam: Leona Leona Twins, AMA, Gestational Diabetes, 39W  Education Done:  External fetal monitoring NST Manual Stimulation VAS Fetal kick counts Other (specify) S/S of Labor and reasons to contact provider    Fetal non-stress test for twin pregnancy  Pre Procedure Diagnosis: Dichorionic diamniotic twin gestation  Post Procedure Diagnosis: Non-reactive NST (non-stress test)            NST Start Time:  06/24/2024 3:15 PM            Uterine Irritability: Yes            Contractions: none    NST Fetus A  Variability: moderate  FHR Baseline: 135 blue  Decelerations: none   Accelerations: at least 15 BPM for 15 seconds  Stimulation: none  NST Interpretation: reactive    NST Fetus B  Variability: moderate  FHR Baseline: 150  Decelerations: none   Accelerations: none   Stimulation: none  NST Interpretation: nonreactive    End Time:  06/24/2024 3:55 PM          Duration of test (min): 40  Location of NST Fetal Heart Tracing: Archived electronically  Reviewed with:  Dr Phylliss  Comments:  Add on BPP-Message to provider for c/o Shortness of breath and edema - Mom Tachy @ [phone]      Test performed By: Avelina Shed, RN  06/24/2024 3:13 PM

## 2024-06-25 ENCOUNTER — Ambulatory Visit: Payer: Self-pay

## 2024-06-28 NOTE — Progress Notes (Signed)
 [facility]  OBSTETRICS AND GYNECOLOGY     OB Check [redacted]w[redacted]d    Subjective     Madison Rose is a 33 y.o. [phone] female at [redacted]w[redacted]d who presents today for routine OB check. She states she is doing well overall; no questions or concerns.      Contractions: No   Loss of fluid: No   Bleeding: No   FM:  Yes x2     Peripartum management:  Pregnancy support: FOB, daughter  Anticipated sex: female x2  Feeding plan: breast & bottle  ppBC: likely Nexplanon    Problem list, vitals, family & social history, medications, and allergies were reviewed and updated as appropriate today.    Objective     Vitals:    07/01/24 1338   BP: 133/77   Pulse: (!) 114   Weight: 123.2 kg (271 lb 9.6 oz)   Height: 1.753 m (5' 9)     Body mass index is 40.11 kg/m.    Gen: well appearing pregnant female in NAD  Cardiopulmonary: Normal work of breathing  Abd: Gravid non-tender  Pelvic: Not indicated  Ext: No edema noted    Doptones: 144 & 147 bpm- patient going to NST directly after appointment    Assessment and Plan     33 y.o. [id number] at [redacted]w[redacted]d with pregnancy complicated most notably by below risk factors.     Dichorionic Diamniotic Twin Pregnancy   - USN 06/24/2024: A- vertex, maternal left, anterior placenta, AFI wnl, EFW 2639 grams (32nd percentile), B- breech, maternal right, posterior placenta, AFI wnl, EFW 2993 grams (69th percentile), 11.8% discordance  - USN 07/01/2024: A- vertex, maternal left, B- breech, maternal right. AFI wnl x2.      AMA      UTI in pregnancy  -Klebsiella positive on 3/12, 3/27, 4/8   -s/p treatment with  Macrobid  (3/14-3/19), s/p cefpodixime (4/10-4/17)   -4/29 klebsiella 10-50k iso pyelonephritis, treated with IV CTX then keflex  suppression   -Currently taking keflex  suppression 500 mg daily      Sickle cell trait     SMA carrier screen positive      Hx of C/S, desires rLTCS  - Records unavailable, 2009 in G6 at 40 weeks for fetal intolerance   - Discussed rLTCS today with patient; IPPOC  reviewed in detail with all questions answered, able to be printed & signed on DOS  - Case scheduled for 07/08/2024     Migraines     Hx of abdominoplasty      Elevated early 1 hr GTT 144  - Declined subsequent testing     Anemia   4/30: 9.5/28 > 6/30 10.0/29    Rubella equivocal  - Plan for MMR pp    1) Rh positive/ Rubella equivocal/ GBS declined by pt after discussion today  2) Pregnancy health maintenance:   - TDaP: offered today and accepted by patient  3) Labor precautions reviewed.  4) Fetal lie: vertex/breech by US  7/18    Return to clinic in 1 week.    Discussed with Dr. Duwaine Damien Albino, MD  OBGYN PGY-2

## 2024-06-30 ENCOUNTER — Ambulatory Visit: Payer: MEDICAID | Attending: Obstetrics and Gynecology

## 2024-06-30 DIAGNOSIS — O99213 Obesity complicating pregnancy, third trimester: Secondary | ICD-10-CM | POA: Insufficient documentation

## 2024-06-30 DIAGNOSIS — O34211 Maternal care for low transverse scar from previous cesarean delivery: Secondary | ICD-10-CM | POA: Insufficient documentation

## 2024-06-30 DIAGNOSIS — E669 Obesity, unspecified: Secondary | ICD-10-CM | POA: Insufficient documentation

## 2024-06-30 DIAGNOSIS — D251 Intramural leiomyoma of uterus: Secondary | ICD-10-CM | POA: Insufficient documentation

## 2024-06-30 DIAGNOSIS — O30043 Twin pregnancy, dichorionic/diamniotic, third trimester: Secondary | ICD-10-CM | POA: Insufficient documentation

## 2024-06-30 DIAGNOSIS — Z6839 Body mass index (BMI) 39.0-39.9, adult: Secondary | ICD-10-CM | POA: Insufficient documentation

## 2024-06-30 DIAGNOSIS — Z3A36 36 weeks gestation of pregnancy: Secondary | ICD-10-CM | POA: Insufficient documentation

## 2024-06-30 DIAGNOSIS — O321XX2 Maternal care for breech presentation, fetus 2: Secondary | ICD-10-CM | POA: Insufficient documentation

## 2024-06-30 DIAGNOSIS — Z3490 Encounter for supervision of normal pregnancy, unspecified, unspecified trimester: Secondary | ICD-10-CM | POA: Insufficient documentation

## 2024-07-01 ENCOUNTER — Other Ambulatory Visit: Payer: Self-pay

## 2024-07-01 ENCOUNTER — Ambulatory Visit: Payer: MEDICAID

## 2024-07-01 VITALS — BP 133/83 | HR 107

## 2024-07-01 VITALS — BP 133/77 | HR 114 | Ht 69.0 in | Wt 271.6 lb

## 2024-07-01 DIAGNOSIS — Z23 Encounter for immunization: Secondary | ICD-10-CM | POA: Insufficient documentation

## 2024-07-01 DIAGNOSIS — O30043 Twin pregnancy, dichorionic/diamniotic, third trimester: Secondary | ICD-10-CM

## 2024-07-01 DIAGNOSIS — Z3A37 37 weeks gestation of pregnancy: Secondary | ICD-10-CM | POA: Insufficient documentation

## 2024-07-01 DIAGNOSIS — O26899 Other specified pregnancy related conditions, unspecified trimester: Secondary | ICD-10-CM | POA: Insufficient documentation

## 2024-07-01 DIAGNOSIS — R109 Unspecified abdominal pain: Secondary | ICD-10-CM | POA: Insufficient documentation

## 2024-07-01 NOTE — Patient Instructions (Addendum)
 Signs and Symptoms of Labor    When should I call my doctor or midwife?  Call your doctor or midwife if you think you are in labor. You should also call if ANY of the following things happen:     You have blood, mucus, or fluid leaking from your vagina.   You have 6 or more contractions in 1 hour. (That means your contractions are 10 minutes apart or less.)   Your contractions are getting stronger and are painful.    Your doctor or midwife will probably want to see you to do an exam. To tell if you are in labor, he or she will check your cervix to see if it is opening ("dilated") and thinning out. He or she will see how frequent your contractions are. He or she might also do other tests.    What is labor?    Labor is the way a woman's body prepares to give birth. Labor usually starts on its own between 37 and 42 weeks of pregnancy. A woman's "due date" is at 40 weeks.   A pregnancy that lasts 37 to 42 weeks is called a "term" pregnancy. When labor starts before 37 weeks, doctors call it "preterm" labor.    Is there any way to predict when I'm going to go into labor?    Not really. Experts don't fully understand what triggers the onset of labor, and there's no way to predict exactly when it will start. Your body actually starts preparing for labor up to a month before you give birth. You may be blissfully unaware of what's going on -- or you may begin to notice new symptoms as your due date draws near.    What are some signs that labor may be starting?     The baby moves lower (or "drops") in your belly.   You have increased vaginal discharge that is thick, mucus-like, or slightly bloody. (Vaginal discharge is the term doctors use to describe the fluid that comes out of the vagina.) The increased vaginal discharge is sometimes called a "mucus plug" or a "bloody show."   Your water breaks. During pregnancy, your baby is in a sac in your uterus and surrounded by a fluid called "amniotic fluid." This sac will  break open sometime before your baby is born. When it breaks open, the fluid inside comes out of your vagina. This can feel like a gush or trickle of fluid.   You have low back pain or belly cramps.   You start having contractions. During a contraction, the uterus tightens. This can be painful and make your belly feel hard. After a contraction, the uterus relaxes and the pain goes away. Some women have "Braxton Hicks contractions" or "false labor contractions." These feel like contractions, but they are not true contractions. They do not mean that you are in labor.    How can I tell if I'm having true contractions?    It can be hard to tell if you are having true contractions or Braxton Hicks contractions. But here are some ways to help tell the difference.     True contractions come every few minutes and get more frequent over time. Braxton Hicks contractions can come every few minutes, but they don't get more frequent over time.   True contractions don't go away, even when you rest. Braxton Hicks contractions usually go away when you rest.   True contractions will get stronger and more painful over time. Braxton Hicks contractions  usually don't get stronger or more painful over time.     If you are still not sure whether you are having true contractions, call your doctor or midwife.    What should I do if I start having contractions?    If you start having contractions, you should time them to see how far apart they are. That way, you can tell if they get more frequent. You can time your contractions by writing down the time when each contraction starts. If you have a clock with a second hand, you can also time how long each contraction lasts. Your doctor or midwife will want to know how far apart your contractions are and how long they last.    What if my labor starts too soon?    If you start having any symptoms of labor before 37 weeks, call your doctor right away. He or she might want to give you  medicine to try to stop your labor.    What if my labor doesn't start on its own?    If your labor doesn't start on its own, your doctor will talk to you about your options. He or she might try to start your labor with medicines. This is called "inducing labor."    How long will my labor last?    If it's your first baby, your labor will probably last for many hours. If it's not your first baby, your labor will probably be shorter.    What should I tell the person on call?  Make sure to specifically tell them about any of the following if they apply to you:     Breech at your last office visit   Prior cesarean section   Diabetes   High blood pressure   History of fast labors   Genital herpes   Positive strep cultures (GBS+)    Using a blood thinner

## 2024-07-01 NOTE — Invasive Procedure Plan of Care (Signed)
 CONSENT FOR MEDICAL  OR SURGICAL PROCEDURE                            Patient Name: Madison Rose  [facility] [address]                                                              DOB: 01/09/1991         Please read this form or have someone read it to you.   It's important to understand all parts of this form. If something isn't clear, ask us  to explain.   When you sign it, that means you understand the form and give us  permission to do this surgery or procedure.     I agree for Obgyn, Surgeon , and /or OB/GYN Staff Attending along with any assistants* they may choose, to treat the following condition(s): History of CS x1, desires repeat   By doing this surgery or procedure on me: Under anesthesia make an incision in the lower abdomen, separate the bladder off the lower uterus, make an incision in the uterus, deliver the baby and the placenta, repair the incisions.   This is also known as: Cesarean section   Laterality: Not applicable     *if you'd like a list of the assistants, please ask. We can give that to you.    1. The care provider has explained my condition to me. They have told me how the procedure can help me. They have told me about other ways of treating my condition. I understand the care provider cannot guarantee the result of the procedure. If I don't have this procedure, my other choices are: Attempt vaginal delivery    2. The care provider has told me the risks (problems that can happen) of the procedure. I understand there may be unwanted results. The risks that are related to this procedure include: Bleeding, infection, injury to bladder, bowel, ureters, major blood vessels, nerves, and/or ovaries. Anesthesia risks including lung and heart problems. Hysterectomy. Clots in blood vessels or lungs. Fatal complications. Problems with bladder or bowel function or fistula (opening between the bowel and/or bladder and/or vagina.) Pain from scars. Fetal laceration. Complications with future pregnancies.     3.  I understand that during the procedure, my care provider may find a condition that we didn't know about before the treatment started. Therefore, I agree that my care provider can perform any other treatment which they think is necessary and available.    4. I give permission to the hospital and/or its departments to examine and keep tissue, blood, body parts, fluids or materials removed from my body during the procedure(s) to aid in diagnosis and treatment, after which they may be used for scientific research or teaching by appropriate persons. If these materials are used for science or teaching, my identity will be protected. I will no longer own or have any rights to these materials regardless of how they may be used.    5. My care provider might want a representative from a medical device company to be there during my procedure. I understand that person works for:          The ways they might help my care provider during my procedure  include:            6. Here are my decisions about receiving blood, blood products, or tissues. I understand my decisions cover the time before, during and after my procedure, my treatment, and my time in the hospital. After my procedure, if my condition changes a lot, my care provider will talk with me again about receiving blood or blood products. At that time, my care provider might need me to review and sign another consent form, about getting or refusing blood.    I understand that the blood is from the community blood supply. Volunteers donated the blood, the volunteers were screened for health problems. The blood was examined with very sensitive and accurate tests to look for hepatitis, HIV/AIDS, and other diseases. Before I receive blood, it is tested again to make sure it is the correct type.    My chances of getting a sickness from blood products are small. But no transfusion is 100% safe. I understand that my care provider feels the good I will receive from the blood is  greater than the chances of something going wrong. My care provider has answered my questions about blood products.      My decision  about blood or  blood products   Yes, I agree to receive blood or blood products if my care provider thinks they're needed.        My decision   about tissue  Implants     Not applicable.          I understand this  form.    My care provider  or his/her  assistants have  explained:   What I am having done and why I need it.  What other choices I can make instead of having this done.  The benefits and possible risks (problems) to me of having this done.  The benefits and possible risks (problems) to me of receiving transplants, blood, or blood products.  There is no guarantee of the results.  The care provider may not stay with me the entire time that I am in the operating or procedure room.  My provider has explained how this may affect my procedure. My provider has answered my  questions about this.         I give my  permission for  this surgery or  procedure.            _________________________________________                                     My signature  (or parent or other person authorized to sign for you, if you are unable to sign for yourself or if you are under 26 years old)             _____________     Date      (MM/DD/YYYY)           _______    Time      Electronic Signatures will display at the bottom of the consent form.    Care provider's statement: I have discussed the planned procedure, including the possibility for transfusion of blood  products or receipt of tissue as necessary; expected benefits; the possible complications and risks; and possible alternatives  and their benefits and risks with the patients or the patient's surrogate. In my opinion, the patient or the patient's surrogate  understands the  proposed procedure, its risks, benefits and alternatives.              Electronically signed by: Damien Albino, MD                                                 07/01/2024         Date        2:04 PM        Time   Your doctor or someone your doctor has appointed has told you that you may need blood or a blood product transfusion, which has been collected from volunteers, as part of your treatment as a patient.    The reasons you might need blood or blood products include, but are not limited to:   Significant loss of your own blood  Your body may not be getting enough oxygen to its tissues   Treatment of bleeding disorders caused by low platelet counts or platelets that do not work right (platelets are part of a cell that helps to form clots and keeps you from bleeding too much).  You may not have enough of other substances that help your blood clot or stop you from bleeding more  The risks of getting a transfusion of blood or blood products include, but are not limited to:   Damage to the lungs  Difficulty breathing due to fluid in the lungs  The product may contain bacteria or rarely a virus (which includes HIV and Hepatitis)  Blood from the community blood supply has been collected from volunteer donors who have been screened for health risk. The blood has been tested for major blood transmitted disease, but no transfusion is 100% safe. The blood is tested with very sensitive and accurate tests to screen for hepatitis, AIDS, and other disease, which makes the risks very small.  You may have side effects from the transfusion (rash, fever, chills) or an allergic reaction  The transfusion increases your risks of getting infection or cancer coming back  The transfusion can increase the time you have to stay in the hospital  The transfusion can potentially cause death if the wrong blood is given or your body rejects the blood  Before blood is transfused, it is tested again to make sure it is the correct type  There are other options than getting blood or blood products from other people and they include:   Drugs which can decrease bleeding  Drugs which can cause your body to  make more blood (used in elective procedures with advance notice)  Autologous (your own blood) donation (needs pre-arrangement)  No transfusion  If you exercise your right to refuse to be transfused with blood or blood products; these things listed below, among others, could happen to you:  Your body may not get enough oxygen and suffer damage  You may have a higher chance of bleeding  You may limit other options for your condition  You may die from losing too much blood

## 2024-07-01 NOTE — Procedures (Signed)
 Fetal Non-Stress Test    Patient: Madison Rose Age: 33 y.o.  Date of Birth: [birth year]  Dzk:qzfjoz  eMRN: [id number]  GA: [redacted]w[redacted]d  Maternal  Heart Rate: 107 (07/01/24 1433)  BP: 133/83 (07/01/24 1433)    Reason For Exam: Leona leona twins  Education Done:  External fetal monitoring NST    Fetal non-stress test for twin pregnancy  Pre Procedure Diagnosis: Dichorionic diamniotic twin pregnancy  Post Procedure Diagnosis: NST (non-stress test) reactive            NST Start Time:  07/01/2024 2:42 PM            Uterine Irritability: Yes            Contractions: none    NST Fetus A  Variability: moderate  FHR Baseline: 135  Decelerations: none   Accelerations: at least 15 BPM for 15 seconds  Stimulation: none  NST Interpretation: reactive    NST Fetus B  Variability: moderate  FHR Baseline: 145  Decelerations: none   Accelerations: at least 15 BPM for 15 seconds  Stimulation: none  NST Interpretation: reactive    End Time:  07/01/2024 3:13 PM          Duration of test (min): 31  Location of NST Fetal Heart Tracing: Archived electronically  Comments:  Pt reports +FM, denies LOF or VB

## 2024-07-05 ENCOUNTER — Inpatient Hospital Stay: Payer: MEDICAID

## 2024-07-05 ENCOUNTER — Inpatient Hospital Stay
Admission: AD | Admit: 2024-07-05 | Discharge: 2024-07-11 | DRG: 540 | Disposition: A | Discharge status: Home / Self Care | Payer: MEDICAID | Source: Ambulatory Visit | Attending: Obstetrics & Gynecology | Admitting: Obstetrics & Gynecology

## 2024-07-05 ENCOUNTER — Other Ambulatory Visit: Payer: Self-pay

## 2024-07-05 DIAGNOSIS — O9882 Other maternal infectious and parasitic diseases complicating childbirth: Secondary | ICD-10-CM | POA: Diagnosis present

## 2024-07-05 DIAGNOSIS — O321XX0 Maternal care for breech presentation, not applicable or unspecified: Secondary | ICD-10-CM | POA: Diagnosis present

## 2024-07-05 DIAGNOSIS — O34219 Maternal care for unspecified type scar from previous cesarean delivery: Secondary | ICD-10-CM | POA: Insufficient documentation

## 2024-07-05 DIAGNOSIS — O30003 Twin pregnancy, unspecified number of placenta and unspecified number of amniotic sacs, third trimester: Secondary | ICD-10-CM

## 2024-07-05 DIAGNOSIS — O139 Gestational [pregnancy-induced] hypertension without significant proteinuria, unspecified trimester: Secondary | ICD-10-CM

## 2024-07-05 DIAGNOSIS — D573 Sickle-cell trait: Secondary | ICD-10-CM | POA: Diagnosis present

## 2024-07-05 DIAGNOSIS — Z3A37 37 weeks gestation of pregnancy: Secondary | ICD-10-CM

## 2024-07-05 DIAGNOSIS — O30043 Twin pregnancy, dichorionic/diamniotic, third trimester: Principal | ICD-10-CM | POA: Diagnosis present

## 2024-07-05 DIAGNOSIS — G43909 Migraine, unspecified, not intractable, without status migrainosus: Secondary | ICD-10-CM | POA: Diagnosis present

## 2024-07-05 DIAGNOSIS — D649 Anemia, unspecified: Secondary | ICD-10-CM | POA: Diagnosis present

## 2024-07-05 DIAGNOSIS — O9902 Anemia complicating childbirth: Secondary | ICD-10-CM | POA: Diagnosis present

## 2024-07-05 DIAGNOSIS — O99354 Diseases of the nervous system complicating childbirth: Secondary | ICD-10-CM | POA: Diagnosis present

## 2024-07-05 DIAGNOSIS — Z789 Other specified health status: Secondary | ICD-10-CM

## 2024-07-05 DIAGNOSIS — N12 Tubulo-interstitial nephritis, not specified as acute or chronic: Secondary | ICD-10-CM | POA: Diagnosis present

## 2024-07-05 DIAGNOSIS — O134 Gestational [pregnancy-induced] hypertension without significant proteinuria, complicating childbirth: Secondary | ICD-10-CM | POA: Diagnosis present

## 2024-07-05 DIAGNOSIS — D62 Acute posthemorrhagic anemia: Secondary | ICD-10-CM | POA: Insufficient documentation

## 2024-07-05 DIAGNOSIS — Z98891 History of uterine scar from previous surgery: Secondary | ICD-10-CM

## 2024-07-05 DIAGNOSIS — O321XX1 Maternal care for breech presentation, fetus 1: Secondary | ICD-10-CM | POA: Insufficient documentation

## 2024-07-05 LAB — CBC AND DIFFERENTIAL
Baso # K/uL: 0 THOU/uL (ref 0.0–0.2)
Eos # K/uL: 0.1 THOU/uL (ref 0.0–0.5)
Hematocrit: 29 % — ABNORMAL LOW (ref 34–49)
Hemoglobin: 9.5 g/dL — ABNORMAL LOW (ref 11.2–16.0)
IMM Granulocytes #: 0.1 THOU/uL — ABNORMAL HIGH
IMM Granulocytes: 2.5 %
Lymph # K/uL: 1 THOU/uL (ref 1.0–5.0)
MCV: 93 fL (ref 75–100)
Mono # K/uL: 0.5 THOU/uL (ref 0.1–1.0)
Neut # K/uL: 3.5 THOU/uL (ref 1.5–6.5)
Nucl RBC # K/uL: 0 THOU/uL (ref 0.0–0.1)
Nucl RBC %: 0 /100{WBCs} (ref 0.0–0.2)
Platelets: 212 THOU/uL (ref 150–450)
RBC: 3.1 MIL/uL — ABNORMAL LOW (ref 4.0–5.5)
RDW: 13.7 % (ref 0.0–15.0)
Seg Neut %: 66.4 %
WBC: 5.3 THOU/uL (ref 3.5–11.0)

## 2024-07-05 MED ORDER — FAMOTIDINE (PF) 20 MG/2ML IV SOLN *I*
20.0000 mg | Freq: Two times a day (BID) | INTRAVENOUS | Status: DC | PRN
Start: 2024-07-05 — End: 2024-07-05

## 2024-07-05 MED ORDER — FAMOTIDINE 20 MG PO TABS *I*
20.0000 mg | ORAL_TABLET | Freq: Once | ORAL | Status: AC
Start: 2024-07-06 — End: 2024-07-07
  Administered 2024-07-06: 20 mg via ORAL
  Filled 2024-07-05: qty 1

## 2024-07-05 MED ORDER — CYCLOBENZAPRINE HCL 10 MG PO TABS *I*
10.0000 mg | ORAL_TABLET | Freq: Once | ORAL | Status: AC
Start: 2024-07-05 — End: 2024-07-05
  Administered 2024-07-05: 10 mg via ORAL
  Filled 2024-07-05: qty 1

## 2024-07-05 MED ORDER — ACETAMINOPHEN 500 MG PO TABS *I*
1000.0000 mg | ORAL_TABLET | Freq: Once | ORAL | Status: AC
Start: 2024-07-05 — End: 2024-07-05
  Administered 2024-07-05: 1000 mg via ORAL
  Filled 2024-07-05: qty 2

## 2024-07-05 NOTE — OB Triage Note (Signed)
 Pt arrived to triage ambulatory and in stable condition complaining of abdominal pain. Maternal VSS and CEFM applied. Pt denies VB, LOF and endorses + FM for both babies. MD made aware of arrival.       Nash Endo, RN

## 2024-07-05 NOTE — OB Triage Note (Signed)
 OBSTETRICS EVALUATION      Referring Hospital (if applicable): {Referring Hospital:[id number]::Referring Hospital:}  Primary OB-GYN: ***    Chief Complaint: ***    HPI     Cristie Mckinney is a 33 y.o. H2E8948 at [redacted]w[redacted]d by {ob dating:14516::LMP c/w ***w ultrasound} with pregnancy complicated by risks outlined below who presents ***.    Patient's medications, allergies, past medical, surgical, social and family histories were recorded, reviewed and updated as appropriate.     Pregnancy Risks     ***    Obstetrical History     OB History   Gravida Para Term Preterm AB Living   7 1 1  0 5 1   SAB IAB Ectopic Multiple Live Births   0 0 0 0 1      # Outcome Date GA Lbr Len/2nd Weight Sex Type Anes PTL Lv   7 Current            6 Term 10/20/08 [redacted]w[redacted]d  3610 g (7 lb 15.3 oz) F *CSLT Spinal N LIV      Complications: Fetal Intolerance   5 AB      TAB      4 AB      SAB      3 AB      SAB      2 AB      TAB      1 AB      TAB         Obstetric Comments   12/23/23: No carrier screening results found.   Pt states she is a carrier of sickle cell trait. States the FOB is not.    G7 P1051 is correct, does not wish to add dates of prior TABs/SABs         Prenatal Labs     No results for input(s): WBC, HGB, HCT, PLT in the last 168 hours.  No results for input(s): NA, K, CL, CO2, UN, CREAT, GFRC, GLU in the last 168 hours. No results for input(s): LD, URIC, ALT, AST, ALK, TB in the last 168 hours.   No results for input(s): UTPR, UCRR in the last 168 hours.    Urine spot P/C ratio:      Lab results: 06/13/24  2131   TP Creatinine ratio,UR 0.21             Lab results: 06/01/24  0932 05/11/24  2331 04/12/24  1952 03/22/24  1613 02/29/24  1158   ABO RH Blood Type  --   --  O RH POS  --  O RH POS   Rubella IgG AB  --   --   --   --  0.8   Group B Strep Culture  --  .  --   --   --    Syphilis Screen  --   --  Neg  --  Neg   HIV 1&2 ANTIGEN/ANTIBODY  --   --   --   --  Nonreactive   HBV S Ag  --   --    --   --  NEG   N. gonorrhoeae NAAT (PCR) NEGATIVE  --   --    < >  --    Chlamydia NAAT (PCR) NEGATIVE  --   --    < >  --     < > = values in this interval not displayed.        Lab results: 02/29/24  1158   Glucose,50gm  1HR 144*      No results for input(s): GL0, GL1H, GL2H, GL3H in the last 8760 hours.        Prenatal Ultrasound Review (Pertinent Findings):  ***      Physical Exam     Vitals:    07/05/24 2214   BP: 139/86   Pulse: 105   Resp: 16   Temp: 36.3 C (97.3 F)   TempSrc: Temporal   SpO2: 98%       {Choose how you'd like to document your physical zkjf!:[id number]}     {Cervical Exam (Optional will disappear if not selected when signing note!):999004089}    {Sterile Spec Exam  (Optional - if not filled out will disappear upon note signing):[phone]::Sterile Speculum Exam:}    {Wet Prep Results (Optional):[phone]::Wet Prep: }    Estimated Fetal Weight: *** grams by {Fetal Weight Estimation Method:999004091}    Presentation: {obgyn fetal presentation:315498} by ultrasound    Placental location: {ob gyn placenta location:312808}      Fetal Monitoring:  Baseline: *** bpm  Variability: {variability:304455005}  Accelerations: {ACCELERATIONS:34027}  Decelerations: {[phone]}  Category: ***  Toco: {Tocometer:999004096}      Assessment & Plan     {Choose which A&P you'd like for this visit!:999004092}

## 2024-07-05 NOTE — OB Triage Note (Incomplete)
 OBSTETRICS EVALUATION      Primary OB-GYN: [facility]    Chief Complaint: back + abdominal pain, shortness of breath    HPI     Madison Rose is a 33 y.o. H2E8948 at [redacted]w[redacted]d by LMP c/w early ultrasound with pregnancy complicated by risks outlined below who presents with back pain, abdominal tightening, shortness of breath.    Back pain:  She has history of pyelonephritis this pregnancy (4/29), and reports her upper back pain (initially stated R>L, then stated L>R) is the same pain. It has been present at a low level for months, but when she awoke around 7am it was much more severe. The pain is constant, cannot identify any exacerbating or alleviating factors. Denies any trauma or falls.    Abdominal tightening:  Present since last night. Reports episodes of abdominal tightening that last 2-5 minutes at a time before resolving. Hard to tell exactly how often the episodes occur - tried to time them, once they were 30 minutes apart, then 15 minutes apart. Associated with pain in her low back/tailbone.     Substernal pain:  Pain under sternum on L side, worse when she takes a deep breath or when she lays down. Has been present (although dull) for weeks, but reports this morning became sharp like someone is stabbing her.    Dyspnea:  Endorses worsening dyspnea when laying flat. Reports she has been sleeping elevated with several pillows throughout this pregnancy. Has been short of breath for weeks, When she lays flat she coughs and sputters. Worsening LE edema over past few days.     ROS:  Denies F/C, productive cough. Denies burning with urination  Reports she takes her keflex  suppression daily but feels like it isn't working due to persistence of her back/flank pain.     -LOF, -VB, +FM x2    Patient's medications, allergies, past medical, surgical, social and family histories were recorded, reviewed and updated as appropriate.     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  - Klebsiella positive on  3/12, 3/27, 4/8, 4/29, 6/30   - s/p treatment with  Macrobid  (3/14-3/19), s/p cefpodixime (4/10-4/17)  - inpatient admission 4/29 for pyelonephritis, treated with ceftriaxone   - OBT visit 6/30: received 1 time dose CTX then 7 day course of keflex  at treatment dose (500mg  q6)  - keflex  500mg  daily suppression     Dichorionic Diamniotic Twin Pregnancy   - USN 06/24/2024: A- vertex, maternal left, anterior placenta, AFI wnl, EFW 2639 grams (32nd percentile), B- breech, maternal right, posterior placenta, AFI wnl, EFW 2993 grams (69th percentile), 11.8% discordance  - USN 07/01/2024: A- vertex, maternal left, B- breech, maternal right. AFI wnl x2.      AMA      Sickle cell trait     Hx of C/S  - records unavailable, 2009 in P6 at 40 weeks for fetal intolerance   - plans repeat CS, scheduled for Friday 7/25     Migraines     Hx of abdominoplasty     SMA carrier     Elevated early 1 hr GTT     Anemia     Obstetrical History     OB History   Gravida Para Term Preterm AB Living   7 1 1  0 5 1   SAB IAB Ectopic Multiple Live Births   0 0 0 0 1      # Outcome Date GA Lbr Len/2nd Weight Sex Type Anes PTL  Lv   7 Current            6 Term 10/20/08 [redacted]w[redacted]d  3610 g (7 lb 15.3 oz) F *CSLT Spinal N LIV      Complications: Fetal Intolerance   5 AB      TAB      4 AB      SAB      3 AB      SAB      2 AB      TAB      1 AB      TAB         Obstetric Comments   12/23/23: No carrier screening results found.   Pt states she is a carrier of sickle cell trait. States the FOB is not.    G7 P1051 is correct, does not wish to add dates of prior TABs/SABs         Prenatal Labs     Recent Labs   Lab 07/05/24  2251   WBC 5.3   Hemoglobin 9.5*   Hematocrit 29*   Platelets 212     No results for input(s): NA, K, CL, CO2, UN, CREAT, GFRC, GLU in the last 168 hours. No results for input(s): LD, URIC, ALT, AST, ALK, TB in the last 168 hours.   No results for input(s): UTPR, UCRR in the last 168 hours.    Urine spot P/C  ratio:      Lab results: 06/13/24  2131   TP Creatinine ratio,UR 0.21             Lab results: 06/01/24  0932 05/11/24  2331 04/12/24  1952 03/22/24  1613 02/29/24  1158   ABO RH Blood Type  --   --  O RH POS  --  O RH POS   Rubella IgG AB  --   --   --   --  0.8   Group B Strep Culture  --  .  --   --   --    Syphilis Screen  --   --  Neg  --  Neg   HIV 1&2 ANTIGEN/ANTIBODY  --   --   --   --  Nonreactive   HBV S Ag  --   --   --   --  NEG   N. gonorrhoeae NAAT (PCR) NEGATIVE  --   --    < >  --    Chlamydia NAAT (PCR) NEGATIVE  --   --    < >  --     < > = values in this interval not displayed.        Lab results: 02/29/24  1158   Glucose,50gm 1HR 144*      No results for input(s): GL0, GL1H, GL2H, GL3H in the last 8760 hours.        Prenatal Ultrasound Review (Pertinent Findings):  ***      Physical Exam     Vitals:    07/05/24 2214   BP: 139/86   Pulse: 105   Resp: 16   Temp: 36.3 C (97.3 F)   TempSrc: Temporal   SpO2: 98%       Mikaela RN was present in the exam room for the exam.    General Appearance: moderate distress and cooperative  Mental Status: Alert and oriented x 3  Skin: color normal and temperature normal  HEENT: Normocephalic, atraumatic.  Cardiovascular: normal rate and regular rhythm  Respiratory:  clear to auscultation, no wheezes, rales or rhonchi, symmetric air entry  Abdomen: soft, gravid. Moderately tender to palpation over R-mid abdomen, suprapubic region, and over inferior-left aspect of sterum, without rebound or guarding.      Back: +CVA tenderness R>L  Uterus: Gravid.  *** weeks size.  Non-tender to palpation.  Neurological: {neuro:304455170::Not assessed}  Extremities: edema 2+ to shins bilaterally. No calf TTP    External Genitalia: Unremarkable external genitalia without lesions.  Normal appearing labia majora/minora and introitus.  Vagina: Pink, ruggated vaginal mucosa without lesions.  *** discharge.  *** bleeding.  Cervix: Cervical examination as per below.  No lesions.   *** leakage of fluid.     Most Recent Cervical Exam: 1.5/thick/high > *** on recheck    Estimated Fetal Weight: *** grams by {Fetal Weight Estimation Method:999004091}    Presentation: {obgyn fetal presentation:315498} by ultrasound    Placental location: {ob gyn placenta location:312808}      Fetal Monitoring:  Baby A:   Baseline: *** bpm  Variability: {variability:304455005}  Accelerations: {ACCELERATIONS:34027}  Decelerations: {Decelerations:9396746329}  Category: ***  Toco: {Tocometer:999004096}    Baby B:  Baseline: *** bpm  Variability: {variability:304455005}  Accelerations: {ACCELERATIONS:34027}  Decelerations: {Decelerations:9396746329}  Category: ***  Toco: {Tocometer:999004096}      Assessment & Plan     {Choose which A&P you'd like for this visit!:999004092}

## 2024-07-06 ENCOUNTER — Encounter
Admission: AD | Disposition: A | Discharge status: Home / Self Care | Payer: Self-pay | Source: Ambulatory Visit | Attending: Obstetrics & Gynecology

## 2024-07-06 ENCOUNTER — Observation Stay: Payer: MEDICAID | Admitting: Anesthesiology

## 2024-07-06 ENCOUNTER — Observation Stay: Payer: MEDICAID

## 2024-07-06 ENCOUNTER — Encounter: Payer: Self-pay | Admitting: Obstetrics & Gynecology

## 2024-07-06 DIAGNOSIS — O99213 Obesity complicating pregnancy, third trimester: Secondary | ICD-10-CM

## 2024-07-06 DIAGNOSIS — O26899 Other specified pregnancy related conditions, unspecified trimester: Secondary | ICD-10-CM

## 2024-07-06 DIAGNOSIS — R109 Unspecified abdominal pain: Secondary | ICD-10-CM

## 2024-07-06 DIAGNOSIS — Z6839 Body mass index (BMI) 39.0-39.9, adult: Secondary | ICD-10-CM

## 2024-07-06 DIAGNOSIS — O30043 Twin pregnancy, dichorionic/diamniotic, third trimester: Principal | ICD-10-CM

## 2024-07-06 DIAGNOSIS — L910 Hypertrophic scar: Secondary | ICD-10-CM

## 2024-07-06 DIAGNOSIS — Z98891 History of uterine scar from previous surgery: Secondary | ICD-10-CM

## 2024-07-06 DIAGNOSIS — O34219 Maternal care for unspecified type scar from previous cesarean delivery: Secondary | ICD-10-CM

## 2024-07-06 DIAGNOSIS — O2303 Infections of kidney in pregnancy, third trimester: Secondary | ICD-10-CM

## 2024-07-06 DIAGNOSIS — O30041 Twin pregnancy, dichorionic/diamniotic, first trimester: Secondary | ICD-10-CM

## 2024-07-06 DIAGNOSIS — O34211 Maternal care for low transverse scar from previous cesarean delivery: Secondary | ICD-10-CM

## 2024-07-06 DIAGNOSIS — Z3A37 37 weeks gestation of pregnancy: Secondary | ICD-10-CM

## 2024-07-06 DIAGNOSIS — O321XX1 Maternal care for breech presentation, fetus 1: Secondary | ICD-10-CM

## 2024-07-06 DIAGNOSIS — N1330 Unspecified hydronephrosis: Secondary | ICD-10-CM

## 2024-07-06 DIAGNOSIS — O99891 Other specified diseases and conditions complicating pregnancy: Secondary | ICD-10-CM

## 2024-07-06 DIAGNOSIS — N12 Tubulo-interstitial nephritis, not specified as acute or chronic: Secondary | ICD-10-CM | POA: Insufficient documentation

## 2024-07-06 DIAGNOSIS — E669 Obesity, unspecified: Secondary | ICD-10-CM

## 2024-07-06 DIAGNOSIS — O9972 Diseases of the skin and subcutaneous tissue complicating childbirth: Secondary | ICD-10-CM

## 2024-07-06 LAB — COMPREHENSIVE METABOLIC PANEL
ALT: 8 U/L (ref 0–35)
AST: 19 U/L (ref 0–35)
Albumin: 3.1 g/dL — ABNORMAL LOW (ref 3.5–5.2)
Alk Phos: 132 U/L — ABNORMAL HIGH (ref 35–105)
Anion Gap: 11 (ref 7–16)
Bilirubin,Total: 0.5 mg/dL (ref 0.0–1.2)
CO2: 21 mmol/L (ref 20–28)
Calcium: 8.5 mg/dL — ABNORMAL LOW (ref 8.8–10.2)
Chloride: 104 mmol/L (ref 96–108)
Creatinine: 0.72 mg/dL (ref 0.51–0.95)
Glucose: 95 mg/dL (ref 60–99)
Lab: 6 mg/dL (ref 6–20)
Potassium: 3.9 mmol/L (ref 3.3–5.1)
Sodium: 136 mmol/L (ref 133–145)
Total Protein: 6.7 g/dL (ref 6.3–7.7)
eGFR BY CREAT: 111

## 2024-07-06 LAB — URINALYSIS WITH MICROSCOPIC
Glucose,UA: NEGATIVE
Ketones, UA: NEGATIVE
Nitrite,UA: NEGATIVE
Specific Gravity,UA: 1.01 (ref 1.002–1.030)
pH,UA: 7 (ref 5.0–8.0)

## 2024-07-06 LAB — CBC
Hematocrit: 30 % — ABNORMAL LOW (ref 34–49)
Hemoglobin: 9.8 g/dL — ABNORMAL LOW (ref 11.2–16.0)
MCV: 94 fL (ref 75–100)
Platelets: 196 THOU/uL (ref 150–450)
RBC: 3.2 MIL/uL — ABNORMAL LOW (ref 4.0–5.5)
RDW: 13.5 % (ref 0.0–15.0)
WBC: 11.4 THOU/uL — ABNORMAL HIGH (ref 3.5–11.0)

## 2024-07-06 LAB — SYPHILIS SCREEN
Syphilis Screen: NEGATIVE
Syphilis Status: NONREACTIVE

## 2024-07-06 LAB — RED BLOOD CELLS
Component blood type: O POS
Component blood type: O POS

## 2024-07-06 LAB — AEROBIC BACTERIAL URINE CULTURE: Aerobic bacterial urine culture: 0

## 2024-07-06 LAB — TYPE AND SCREEN
ABO RH Blood Type: O POS
Antibody Screen: NEGATIVE

## 2024-07-06 LAB — NT-PRO BNP: NT-pro BNP: <50 pg/mL (ref 0–450)

## 2024-07-06 SURGERY — Surgical Case
Anesthesia: Spinal | Site: Abdomen | Wound class: Clean Contaminated

## 2024-07-06 MED ORDER — SODIUM CHLORIDE 0.9 % FLUSH FOR PUMPS *I*
0.0000 mL/h | INTRAVENOUS | Status: DC | PRN
Start: 2024-07-06 — End: 2024-07-06

## 2024-07-06 MED ORDER — ELECTRIC BREAST PUMP (FOR PERSONAL USE) *A*: Status: DC

## 2024-07-06 MED ORDER — TRANEXAMIC ACID 1000 MG/10ML IV SOLN *I*
INTRAVENOUS | Status: DC | PRN
Start: 2024-07-06 — End: 2024-07-06
  Administered 2024-07-06: 1000 mg via INTRAVENOUS

## 2024-07-06 MED ORDER — MEASLES, MUMPS & RUBELLA VAC SC INJ *I*
0.5000 mL | INJECTION | SUBCUTANEOUS | Status: DC
Start: 2024-07-06 — End: 2024-07-11
  Filled 2024-07-06: qty 0.5

## 2024-07-06 MED ORDER — NALBUPHINE HCL 10 MG/ML IJ SOLN *I*
3.0000 mg | INTRAMUSCULAR | Status: DC | PRN
Start: 2024-07-06 — End: 2024-07-06

## 2024-07-06 MED ORDER — BUPIVACAINE IN DEXTROSE 0.75-8.25 % IT SOLN *I*
INTRATHECAL | Status: DC | PRN
Start: 2024-07-06 — End: 2024-07-06
  Administered 2024-07-06: 1.6 mL via INTRATHECAL

## 2024-07-06 MED ORDER — PHYTONADIONE 1 MG/0.5ML IJ SOLN *I*
INTRAMUSCULAR | Status: AC
Start: 2024-07-06 — End: 2024-07-06
  Filled 2024-07-06: qty 1

## 2024-07-06 MED ORDER — MISOPROSTOL 200 MCG PO TABS *I*
ORAL_TABLET | ORAL | Status: DC | PRN
Start: 2024-07-06 — End: 2024-07-06
  Administered 2024-07-06: 800 ug via BUCCAL

## 2024-07-06 MED ORDER — OXYCODONE HCL 10 MG PO TABS *I*
10.0000 mg | ORAL_TABLET | ORAL | Status: DC | PRN
Start: 2024-07-06 — End: 2024-07-11
  Administered 2024-07-07 – 2024-07-11 (×13): 10 mg via ORAL
  Filled 2024-07-06 (×13): qty 1

## 2024-07-06 MED ORDER — SENNOSIDES 8.6 MG PO TABS *I*
1.0000 | ORAL_TABLET | Freq: Every evening | ORAL | Status: DC | PRN
Start: 2024-07-06 — End: 2024-07-11
  Administered 2024-07-08 – 2024-07-09 (×3): 1 via ORAL
  Filled 2024-07-06 (×3): qty 1

## 2024-07-06 MED ORDER — CHEWING GUM *I*
1.0000 | CHEWING_GUM | Freq: Three times a day (TID) | ORAL | Status: DC
Start: 2024-07-06 — End: 2024-07-11
  Administered 2024-07-07 – 2024-07-11 (×12): 1 via ORAL

## 2024-07-06 MED ORDER — CEFTRIAXONE SODIUM 1 G IN STERILE WATER 10ML SYRINGE *I*
1000.0000 mg | INTRAVENOUS | Status: DC
Start: 2024-07-06 — End: 2024-07-06
  Administered 2024-07-06: 1000 mg via INTRAVENOUS
  Filled 2024-07-06: qty 10
  Filled 2024-07-06: qty 1000

## 2024-07-06 MED ORDER — FENTANYL CITRATE 50 MCG/ML IJ SOLN *WRAPPED*
INTRAMUSCULAR | Status: DC | PRN
Start: 2024-07-06 — End: 2024-07-06
  Administered 2024-07-06: 10 ug via INTRATHECAL

## 2024-07-06 MED ORDER — CEFAZOLIN 2000 MG IN STERILE WATER 20ML SYRINGE *I*
PREFILLED_SYRINGE | INTRAVENOUS | Status: AC
Start: 2024-07-06 — End: 2024-07-06
  Filled 2024-07-06: qty 20

## 2024-07-06 MED ORDER — ACETAMINOPHEN 500 MG PO TABS *I*
1000.0000 mg | ORAL_TABLET | Freq: Four times a day (QID) | ORAL | Status: DC
Start: 2024-07-06 — End: 2024-07-06
  Administered 2024-07-06: 1000 mg via ORAL
  Filled 2024-07-06: qty 2

## 2024-07-06 MED ORDER — OXYTOCIN 30 UNITS/500 ML NS *POSTPARTUM* *I*
125.0000 m[IU]/min | INTRAMUSCULAR | Status: AC
Start: 2024-07-06 — End: 2024-07-07
  Administered 2024-07-06 (×3): 125 m[IU]/min via INTRAVENOUS

## 2024-07-06 MED ORDER — LIDOCAINE 4 % EX PATCH *I*
1.0000 | MEDICATED_PATCH | Freq: Every day | CUTANEOUS | Status: DC
Start: 2024-07-06 — End: 2024-07-06
  Administered 2024-07-06: 1 via TRANSDERMAL
  Filled 2024-07-06 (×2): qty 1

## 2024-07-06 MED ORDER — OXYCODONE HCL 5 MG PO TABS *I*
5.0000 mg | ORAL_TABLET | ORAL | Status: DC | PRN
Start: 2024-07-06 — End: 2024-07-11
  Administered 2024-07-07 – 2024-07-09 (×7): 5 mg via ORAL
  Filled 2024-07-06 (×7): qty 1

## 2024-07-06 MED ORDER — SIMETHICONE 80 MG PO CHEW *I*
80.0000 mg | CHEWABLE_TABLET | Freq: Four times a day (QID) | ORAL | Status: DC | PRN
Start: 2024-07-06 — End: 2024-07-11
  Administered 2024-07-07 – 2024-07-08 (×5): 80 mg via ORAL
  Filled 2024-07-06 (×5): qty 1

## 2024-07-06 MED ORDER — PRENATAL PLUS IRON 29-1 MG PO TABS *I*
1.0000 | ORAL_TABLET | Freq: Every day | ORAL | Status: DC
Start: 2024-07-06 — End: 2024-07-06
  Administered 2024-07-06: 1 via ORAL
  Filled 2024-07-06: qty 1

## 2024-07-06 MED ORDER — ONDANSETRON HCL 2 MG/ML IV SOLN *I*
4.0000 mg | Freq: Three times a day (TID) | INTRAMUSCULAR | Status: DC | PRN
Start: 2024-07-06 — End: 2024-07-06

## 2024-07-06 MED ORDER — WITCH HAZEL-GLYCERIN EX PADS *I* WRAPPED
MEDICATED_PAD | CUTANEOUS | Status: DC | PRN
Start: 2024-07-06 — End: 2024-07-11

## 2024-07-06 MED ORDER — CEFAZOLIN 1000 MG IN STERILE WATER 10ML SYRINGE *I*
PREFILLED_SYRINGE | INTRAVENOUS | Status: AC
Start: 2024-07-06 — End: 2024-07-06
  Filled 2024-07-06: qty 10

## 2024-07-06 MED ORDER — HALOPERIDOL LACTATE 5 MG/ML IJ SOLN *I*
0.5000 mg | Freq: Once | INTRAMUSCULAR | Status: DC | PRN
Start: 2024-07-06 — End: 2024-07-06

## 2024-07-06 MED ORDER — ONDANSETRON HCL 2 MG/ML IV SOLN *I*
INTRAMUSCULAR | Status: DC | PRN
Start: 2024-07-06 — End: 2024-07-06
  Administered 2024-07-06: 4 mg via INTRAVENOUS

## 2024-07-06 MED ORDER — OXYTOCIN 30 UNITS IN 500ML NS WRAPPED *I*
INTRAMUSCULAR | Status: DC | PRN
Start: 2024-07-06 — End: 2024-07-06
  Administered 2024-07-06: 500 mL via INTRAVENOUS

## 2024-07-06 MED ORDER — NALOXONE HCL 0.4 MG/ML IJ SOLN *WRAPPED*
0.1000 mg | Status: DC | PRN
Start: 2024-07-06 — End: 2024-07-06

## 2024-07-06 MED ORDER — DEXTROSE 5 % FLUSH FOR PUMPS *I*
0.0000 mL/h | INTRAVENOUS | Status: DC | PRN
Start: 2024-07-06 — End: 2024-07-06

## 2024-07-06 MED ORDER — KETOROLAC TROMETHAMINE 30 MG/ML IJ SOLN *I*
30.0000 mg | Freq: Four times a day (QID) | INTRAMUSCULAR | Status: AC
Start: 2024-07-06 — End: 2024-07-07
  Administered 2024-07-06 – 2024-07-07 (×4): 30 mg via INTRAVENOUS
  Filled 2024-07-06 (×4): qty 1

## 2024-07-06 MED ORDER — SOD CITRATE-CITRIC ACID 500-334 MG/5ML PO SOLN *I*
ORAL | Status: DC | PRN
Start: 2024-07-06 — End: 2024-07-06
  Administered 2024-07-06: 30 mL via ORAL

## 2024-07-06 MED ORDER — OXYTOCIN 30 UNITS/500 ML NS *POSTPARTUM* *I*
125.0000 m[IU]/min | INTRAMUSCULAR | Status: AC
Start: 2024-07-07 — End: 2024-07-07

## 2024-07-06 MED ORDER — DIPHENHYDRAMINE HCL 25 MG ORAL SOLID *WRAPPED*
25.0000 mg | Freq: Every evening | ORAL | Status: DC | PRN
Start: 2024-07-06 — End: 2024-07-11

## 2024-07-06 MED ORDER — FAMOTIDINE (PF) 20 MG/2ML IV SOLN *I*
20.0000 mg | Freq: Two times a day (BID) | INTRAVENOUS | Status: DC
Start: 2024-07-06 — End: 2024-07-06
  Administered 2024-07-06: 20 mg via INTRAVENOUS
  Filled 2024-07-06: qty 2

## 2024-07-06 MED ORDER — METHYLERGONOVINE MALEATE 0.2 MG/ML IJ SOLN *I*
INTRAMUSCULAR | Status: DC | PRN
Start: 2024-07-06 — End: 2024-07-06
  Administered 2024-07-06: 200 ug via INTRAMUSCULAR

## 2024-07-06 MED ORDER — CYCLOBENZAPRINE HCL 10 MG PO TABS *I*
10.0000 mg | ORAL_TABLET | Freq: Once | ORAL | Status: DC
Start: 2024-07-06 — End: 2024-07-06

## 2024-07-06 MED ORDER — POLYETHYLENE GLYCOL 3350 PO PACK 17 GM *I*
17.0000 g | PACK | Freq: Every day | ORAL | Status: DC
Start: 2024-07-07 — End: 2024-07-11
  Administered 2024-07-07 – 2024-07-11 (×4): 17 g via ORAL
  Filled 2024-07-06 (×5): qty 17

## 2024-07-06 MED ORDER — PROCHLORPERAZINE EDISYLATE 5 MG/ML IJ SOLN WRAPPED *I*
5.0000 mg | Freq: Four times a day (QID) | INTRAMUSCULAR | Status: DC | PRN
Start: 2024-07-06 — End: 2024-07-11

## 2024-07-06 MED ORDER — PHENYLEPHRINE 200 MCG/ML 50 ML OR SYRINGE *I*
INTRAVENOUS | Status: DC | PRN
Start: 2024-07-06 — End: 2024-07-06
  Administered 2024-07-06: 50 ug/min via INTRAVENOUS
  Administered 2024-07-06: 25 ug/min via INTRAVENOUS

## 2024-07-06 MED ORDER — CEFAZOLIN 1000 MG IN STERILE WATER 10ML SYRINGE *I*
3000.0000 mg | PREFILLED_SYRINGE | Freq: Once | INTRAVENOUS | Status: DC
Start: 2024-07-06 — End: 2024-07-06
  Filled 2024-07-06: qty 30

## 2024-07-06 MED ORDER — METHYLERGONOVINE MALEATE 0.2 MG/ML IJ SOLN *I*
INTRAMUSCULAR | Status: AC
Start: 2024-07-06 — End: 2024-07-06
  Filled 2024-07-06: qty 1

## 2024-07-06 MED ORDER — OXYTOCIN 10 UNIT/ML IJ SOLN *I*
INTRAMUSCULAR | Status: DC | PRN
Start: 2024-07-06 — End: 2024-07-06
  Administered 2024-07-06 (×3): 3 [IU] via INTRAVENOUS

## 2024-07-06 MED ORDER — CEFAZOLIN SODIUM 1000 MG IJ SOLR *I*
INTRAMUSCULAR | Status: DC | PRN
Start: 2024-07-06 — End: 2024-07-06
  Administered 2024-07-06 (×2): 3000 mg via INTRAVENOUS

## 2024-07-06 MED ORDER — CALCIUM CARBONATE ANTACID 500 MG PO CHEW *I*
1000.0000 mg | CHEWABLE_TABLET | Freq: Three times a day (TID) | ORAL | Status: DC | PRN
Start: 2024-07-06 — End: 2024-07-06
  Administered 2024-07-06: 1000 mg via ORAL
  Filled 2024-07-06: qty 2

## 2024-07-06 MED ORDER — ERYTHROMYCIN 5 MG/GM OP OINT *I*
TOPICAL_OINTMENT | OPHTHALMIC | Status: AC
Start: 2024-07-06 — End: 2024-07-06
  Filled 2024-07-06: qty 2

## 2024-07-06 MED ORDER — DIPHENHYDRAMINE HCL 25 MG ORAL SOLID *WRAPPED*
25.0000 mg | Freq: Every evening | ORAL | Status: DC | PRN
Start: 2024-07-06 — End: 2024-07-06

## 2024-07-06 MED ORDER — ENOXAPARIN SODIUM 40 MG/0.4ML IJ SOSY *I*
40.0000 mg | PREFILLED_SYRINGE | Freq: Two times a day (BID) | INTRAMUSCULAR | Status: DC
Start: 2024-07-07 — End: 2024-07-11
  Administered 2024-07-07 – 2024-07-11 (×9): 40 mg via SUBCUTANEOUS
  Filled 2024-07-06 (×9): qty 0.4

## 2024-07-06 MED ORDER — LACTATED RINGERS IV BOLUS *I*
1000.0000 mL | Freq: Once | INTRAVENOUS | Status: AC
Start: 2024-07-06 — End: 2024-07-06
  Administered 2024-07-06: 1000 mL via INTRAVENOUS

## 2024-07-06 MED ORDER — ASPIRIN 81 MG PO TBEC *I*
162.0000 mg | DELAYED_RELEASE_TABLET | Freq: Every day | ORAL | Status: DC
Start: 2024-07-06 — End: 2024-07-06
  Administered 2024-07-06: 162 mg via ORAL
  Filled 2024-07-06 (×2): qty 2

## 2024-07-06 MED ORDER — MORPHINE SULFATE 1 MG/ML IJ SOLN (IT) *OR USE ONLY*
Status: DC | PRN
Start: 2024-07-06 — End: 2024-07-06
  Administered 2024-07-06: .2 mg via INTRATHECAL

## 2024-07-06 MED ORDER — ONDANSETRON HCL 2 MG/ML IV SOLN *I*
4.0000 mg | Freq: Three times a day (TID) | INTRAMUSCULAR | Status: DC | PRN
Start: 2024-07-06 — End: 2024-07-11

## 2024-07-06 MED ORDER — PLASMA-LYTE IV SOLN *WRAPPED*
Status: DC | PRN
Start: 2024-07-06 — End: 2024-07-06

## 2024-07-06 MED ORDER — ALBUTEROL SULFATE (2.5 MG/3ML) 0.083% IN NEBU *I*
2.5000 mg | INHALATION_SOLUTION | Freq: Once | RESPIRATORY_TRACT | Status: DC | PRN
Start: 2024-07-06 — End: 2024-07-06

## 2024-07-06 MED ORDER — IBUPROFEN 600 MG PO TABS *I*
600.0000 mg | ORAL_TABLET | Freq: Four times a day (QID) | ORAL | Status: DC
Start: 2024-07-07 — End: 2024-07-11
  Administered 2024-07-07 – 2024-07-11 (×15): 600 mg via ORAL
  Filled 2024-07-06 (×14): qty 1

## 2024-07-06 MED ORDER — FENTANYL CITRATE 50 MCG/ML IJ SOLN *WRAPPED*
INTRAMUSCULAR | Status: AC
Start: 2024-07-06 — End: 2024-07-06
  Filled 2024-07-06: qty 2

## 2024-07-06 MED ORDER — ACETAMINOPHEN 325 MG PO TABS *I*
650.0000 mg | ORAL_TABLET | ORAL | Status: DC | PRN
Start: 2024-07-06 — End: 2024-07-06
  Administered 2024-07-06: 650 mg via ORAL
  Filled 2024-07-06: qty 2

## 2024-07-06 MED ORDER — LACTATED RINGERS IV SOLN *I*
50.0000 mL/h | INTRAVENOUS | Status: AC
Start: 2024-07-06 — End: 2024-07-07
  Administered 2024-07-06: 50 mL/h via INTRAVENOUS

## 2024-07-06 MED ORDER — ACETAMINOPHEN 325 MG PO TABS *I*
650.0000 mg | ORAL_TABLET | Freq: Four times a day (QID) | ORAL | Status: DC
Start: 2024-07-06 — End: 2024-09-05
  Administered 2024-07-06 – 2024-07-11 (×18): 650 mg via ORAL
  Filled 2024-07-06 (×19): qty 2

## 2024-07-06 MED ORDER — PHENYLEPHRINE 100 MCG/ML IN NS 10 ML *WRAPPED*
INTRAMUSCULAR | Status: DC | PRN
Start: 2024-07-06 — End: 2024-07-06
  Administered 2024-07-06: 100 ug via INTRAVENOUS
  Administered 2024-07-06: 200 ug via INTRAVENOUS
  Administered 2024-07-06 (×2): 100 ug via INTRAVENOUS
  Administered 2024-07-06: 200 ug via INTRAVENOUS
  Administered 2024-07-06: 300 ug via INTRAVENOUS
  Administered 2024-07-06 (×2): 200 ug via INTRAVENOUS

## 2024-07-06 MED ORDER — MORPHINE SULFATE *PF* 1 MG/ML IJ SOLN *I*
INTRAMUSCULAR | Status: AC
Start: 2024-07-06 — End: 2024-07-06
  Filled 2024-07-06: qty 10

## 2024-07-06 NOTE — Progress Notes (Signed)
 Patient transferred to 312-26 via stretcher in stable condition with Baby A in arms. Report given to [facility] RN who assumes care of this patient at this time. Rosemaria Inabinet Smegelsky, RN

## 2024-07-06 NOTE — Progress Notes (Signed)
 Pt is consented for C/S for a di/di twin gestation.  Pregnancy c/b AMA, sickle cell trait, previous C/S in 2009, history of abdominal plasty, vertex/breech.          Lab results: 07/05/24  2251   WBC 5.3   Hemoglobin 9.5*   Hematocrit 29*   RBC 3.1*   Platelets 212     Madison Fleeta Phlegm, MD

## 2024-07-06 NOTE — Invasive Procedure Plan of Care (Signed)
 CONSENT FOR MEDICAL  OR SURGICAL PROCEDURE                            Patient Name: Madison Rose  [facility] [address]                                                              DOB: Feb 18, 1991         Please read this form or have someone read it to you.   It's important to understand all parts of this form. If something isn't clear, ask us  to explain.   When you sign it, that means you understand the form and give us  permission to do this surgery or procedure.     I agree for Lorren Louise Jansky, MD , and /or OB/GYN Staff Attending along with any assistants* they may choose, to treat the following condition(s): Biophysical profile 2/10 for twin B. History of prior cesarean section with desire for repeat.    By doing this surgery or procedure on me: Under anesthesia make an incision in the lower abdomen, separate the bladder off the lower uterus, make an incision in the uterus, deliver the baby and the placenta, repair the incisions.   This is also known as: Cesarean section   Laterality: Not applicable     *if you'd like a list of the assistants, please ask. We can give that to you.    1. The care provider has explained my condition to me. They have told me how the procedure can help me. They have told me about other ways of treating my condition. I understand the care provider cannot guarantee the result of the procedure. If I don't have this procedure, my other choices are: Attempt vaginal delivery    2. The care provider has told me the risks (problems that can happen) of the procedure. I understand there may be unwanted results. The risks that are related to this procedure include: Bleeding, infection, injury to bladder, bowel, ureters, major blood vessels, nerves, and/or ovaries. Anesthesia risks including lung and heart problems. Hysterectomy. Clots in blood vessels or lungs. Fatal complications. Problems with bladder or bowel function or fistula (opening between the bowel and/or bladder and/or vagina.)  Pain from scars. Fetal laceration. Complications with future pregnancies.     3. I understand that during the procedure, my care provider may find a condition that we didn't know about before the treatment started. Therefore, I agree that my care provider can perform any other treatment which they think is necessary and available.    4. I give permission to the hospital and/or its departments to examine and keep tissue, blood, body parts, fluids or materials removed from my body during the procedure(s) to aid in diagnosis and treatment, after which they may be used for scientific research or teaching by appropriate persons. If these materials are used for science or teaching, my identity will be protected. I will no longer own or have any rights to these materials regardless of how they may be used.    5. My care provider might want a representative from a medical device company to be there during my procedure. I understand that person works for:  The ways they might help my care provider during my procedure include:            6. Here are my decisions about receiving blood, blood products, or tissues. I understand my decisions cover the time before, during and after my procedure, my treatment, and my time in the hospital. After my procedure, if my condition changes a lot, my care provider will talk with me again about receiving blood or blood products. At that time, my care provider might need me to review and sign another consent form, about getting or refusing blood.    I understand that the blood is from the community blood supply. Volunteers donated the blood, the volunteers were screened for health problems. The blood was examined with very sensitive and accurate tests to look for hepatitis, HIV/AIDS, and other diseases. Before I receive blood, it is tested again to make sure it is the correct type.    My chances of getting a sickness from blood products are small. But no transfusion is 100% safe. I  understand that my care provider feels the good I will receive from the blood is greater than the chances of something going wrong. My care provider has answered my questions about blood products.      My decision  about blood or  blood products   Yes, I agree to receive blood or blood products if my care provider thinks they're needed.        My decision   about tissue  Implants     Not applicable.          I understand this  form.    My care provider  or his/her  assistants have  explained:   What I am having done and why I need it.  What other choices I can make instead of having this done.  The benefits and possible risks (problems) to me of having this done.  The benefits and possible risks (problems) to me of receiving transplants, blood, or blood products.  There is no guarantee of the results.  The care provider may not stay with me the entire time that I am in the operating or procedure room.  My provider has explained how this may affect my procedure. My provider has answered my  questions about this.         I give my  permission for  this surgery or  procedure.            _________________________________________                                     My signature  (or parent or other person authorized to sign for you, if you are unable to sign for yourself or if you are under 32 years old)             _____________     Date      (MM/DD/YYYY)           _______    Time      Electronic Signatures will display at the bottom of the consent form.    Care provider's statement: I have discussed the planned procedure, including the possibility for transfusion of blood  products or receipt of tissue as necessary; expected benefits; the possible complications and risks; and possible alternatives  and their benefits and risks with the patients or the patient's surrogate. In  my opinion, the patient or the patient's surrogate  understands the proposed procedure, its risks, benefits and alternatives.               Electronically signed by: Elveria Lawman, MD                                                07/06/2024         Date        4:01 PM        Time   Your doctor or someone your doctor has appointed has told you that you may need blood or a blood product transfusion, which has been collected from volunteers, as part of your treatment as a patient.    The reasons you might need blood or blood products include, but are not limited to:   Significant loss of your own blood  Your body may not be getting enough oxygen to its tissues   Treatment of bleeding disorders caused by low platelet counts or platelets that do not work right (platelets are part of a cell that helps to form clots and keeps you from bleeding too much).  You may not have enough of other substances that help your blood clot or stop you from bleeding more  The risks of getting a transfusion of blood or blood products include, but are not limited to:   Damage to the lungs  Difficulty breathing due to fluid in the lungs  The product may contain bacteria or rarely a virus (which includes HIV and Hepatitis)  Blood from the community blood supply has been collected from volunteer donors who have been screened for health risk. The blood has been tested for major blood transmitted disease, but no transfusion is 100% safe. The blood is tested with very sensitive and accurate tests to screen for hepatitis, AIDS, and other disease, which makes the risks very small.  You may have side effects from the transfusion (rash, fever, chills) or an allergic reaction  The transfusion increases your risks of getting infection or cancer coming back  The transfusion can increase the time you have to stay in the hospital  The transfusion can potentially cause death if the wrong blood is given or your body rejects the blood  Before blood is transfused, it is tested again to make sure it is the correct type  There are other options than getting blood or blood products from other  people and they include:   Drugs which can decrease bleeding  Drugs which can cause your body to make more blood (used in elective procedures with advance notice)  Autologous (your own blood) donation (needs pre-arrangement)  No transfusion  If you exercise your right to refuse to be transfused with blood or blood products; these things listed below, among others, could happen to you:  Your body may not get enough oxygen and suffer damage  You may have a higher chance of bleeding  You may limit other options for your condition  You may die from losing too much blood

## 2024-07-06 NOTE — Progress Notes (Signed)
 Brief Progress Note    Entry delayed due to patient care.     ITSP d/t increased, generalized pain.     Madison Rose reports she is feeling similar to when she got here. Still having back pain, also having abdominal pain and chest pain. Further endorses stabbing sensation in her stomach. Endorses a headache that improved, denies vision changes. Denies SOB. Feeling more vaginal pressure, denies LOF, VB. Good FM x2.    VSS, afebrile, HR wnl.   On exam, patient well appearing  Gen: A&O  CV: Regular rate  Pulm: Unlabored on room air  Abdomen: Soft, moderately TTP throughout, no guarding or rebound    Cervix: 1/th/h, unchanged from prior  Reactive NST x2 7/23 AM    Plan to proceed with repeat NST & conservative measures at this time, including lidocaine  patches, scheduled pepcid /Tums. Encouraged patient to call out w/ any changes in how she is feeling.     D/w Dr. Lorren Ronnald Providence, DO, MPH  Obstetrics & Gynecology, PGY-3

## 2024-07-06 NOTE — Procedures (Cosign Needed)
 Fetal Non-Stress Test    Patient: Madison Rose Age: 33 y.o.  Date of Birth: 17-Mar-1991  Dzk:qzfjoz  eMRN: [id number]  GA: [redacted]w[redacted]d  Maternal  Heart Rate: 105 (07/05/24 2214)  BP: 139/86 (07/05/24 2214)    Reason For Exam: flank pain/abd pain  Education Done:  External fetal monitoring    Fetal non-stress test for twin pregnancy              NST Start Time:  07/05/2024 11:12 PM            Uterine Irritability: No            Contractions: none    NST Fetus A  Variability: moderate  FHR Baseline: 145  Decelerations: none   Accelerations: at least 15 BPM for 15 seconds  Stimulation: none  NST Interpretation: reactive    NST Fetus B  Variability: moderate  FHR Baseline: 150  Decelerations: none   Accelerations: at least 15 BPM for 15 seconds  Stimulation: none  NST Interpretation: reactive    End Time:  07/05/2024 11:32 PM          Duration of test (min): 20  Location of NST Fetal Heart Tracing: Archived electronically  Reviewed with:  Claudene, MD      Test performed By: Nash Endo, RN  07/06/2024 2:47 AM

## 2024-07-06 NOTE — INTERIM OP NOTE (Signed)
 Interim Op Note (Surgical Log ID: [id number])       Date of Surgery: 07/06/2024       Surgeons: Surgeons and Role:     DEWAINE Fleeta Welford Slater, MD - Primary     * Myrna Perkins, MD - Resident - Assisting     * Claudene Dire, MD - Resident - Assisting   Assistants:         Pre-op Diagnosis: Pre-Op Diagnosis Codes:      * Breech presentation, fetus 1 of multiple gestation [O32.1XX1]     * History of cesarean section complicating pregnancy [O34.219]       Post-op Diagnosis: Post-Op Diagnosis Codes:     * Breech presentation, fetus 1 of multiple gestation [O32.1XX1]     * History of cesarean section complicating pregnancy [O34.219]       Procedure(s) Performed: Procedure(s) (LRB):  CESAREAN SECTION (N/A)       Anesthesia Type: Anesthesia type not filed in the log.          Fluid Totals: I/O this shift:  07/23 1500 - 07/23 2259  In: 903.5 [IV Piggyback:903.5]  Out: -   Net: 903.5       Estimated Blood Loss: 2.5L       Specimens to Pathology:  Abdominal wall keloid       Temporary Implants: foley       Packing:  none               Patient Condition: good       Findings (Including unexpected complications): Significant keloid over site of  prior abdominoplasty. Extensive scarring noted subcutaneously. Normal gravid uterus. Normal fallopian tubes and ovaries bilaterally. Clear fluid upon entry into the uterus. Viable female infant weighing 3200 grams with APGARs of 8 & 9.  Viable female infant weighing 2930 grams with APGARs of 8 & 9. Anterior and posterior placentas with 3-vessel cord. Uterine atony requiring misoprostol , methergine , TXA.      Signed:  Dire Claudene, MD  on 07/06/2024 at 5:56 PM

## 2024-07-06 NOTE — Progress Notes (Signed)
 Received report from L&D RN, Katelynn. Arrived to [facility] in [facility] 26 via stretcher accompanied by FOB. AxO x3 and in stable condition.    Oriented patient to room and call light. Pt given: Purell hand sanitizer and admission packet containing the following brochures: The Power to Stop Infection, Managing Your Pain, Welcome to [facility] Perinatal/[facility]N Unit, [facility] (HIPAA), & [facility].     See flowsheet for additional information.     Marylynn Ishikawa, RN

## 2024-07-06 NOTE — Progress Notes (Addendum)
 OBSTETRICS PROGRESS NOTE       Subjective     Today pt reports she continues to have significant side pain and chest pain/tenderness.  She feels like it's hard to walk bc it hurts.  She feelst like something is wrong and no better than earlier.  Initially seen at 11am and again just now.         Objective     Vitals:    07/05/24 2214 07/06/24 0354 07/06/24 0808 07/06/24 1212   BP: 139/86 105/73 117/73 121/82   Pulse: 105 (!) 112 104 (!) 124   Resp: 16 20 20 20    Temp: 36.3 C (97.3 F) 36.4 C (97.5 F) 36.2 C (97.2 F) 36.6 C (97.9 F)   TempSrc: Temporal  Temporal Temporal   SpO2: 98% 96% 98% 98%       Most recent cervical exam:   OB Examiner: Claudene, MD (07/05/24 2235)  Dilation: 1.5  Effacement (%): 0  Station: -3    Unchanged 1230pm today.    Membranes:   Membrane Status: Intact           Fetal Monitoring:  Twin A: 145, reactive  Twin B: 150, moderate variablity, no decelerations no 15 x 15 accelerations.    BPP ordered:   Baby B 2/8 BPP  Baby A: 6/8 (8/10) BPP      Assessment & Plan     Madison Rose is a 33 y.o. H2E8948 at [redacted]w[redacted]d admitted for chest pain/tenderess and flank pain with a history of pyelonephritis.  Now with abnormal fetal testing of Baby B (2/10 BPP)- recommend delivery.  Pt planned for RCS.  Will proceed to the OR now.    Discussed with anesthesia.  Pt last ate at 12 pm. Recommend proceeding with delivery now for fetal concern and not awaiting NPO.

## 2024-07-06 NOTE — Op Note (Signed)
 Operative Note (Surgical Case/Log ID: [id number])       Date of Surgery: 07/06/2024       Surgeons: Surgeons and Role:     DEWAINE Fleeta Welford Slater, MD - Primary     * Myrna Perkins, MD - Resident - Assisting     * Claudene Dire, MD - Resident - Assisting   Assistants:         Pre-op Diagnosis: Pre-Op Diagnosis Codes:      * Breech presentation, fetus 1 of multiple gestation [O32.1XX1]     * History of cesarean section complicating pregnancy [O34.219]       Post-op Diagnosis: Post-Op Diagnosis Codes:     * Breech presentation, fetus 1 of multiple gestation [O32.1XX1]     * History of cesarean section complicating pregnancy [O34.219]       Procedure(s) Performed: Procedure(s) (LRB):  CESAREAN SECTION (N/A)       Anesthesia Type: Anesthesia type not filed in the log.           Fluid Totals: I/O this shift:  07/23 1500 - 07/23 2259  In: 903.5 [IV Piggyback:903.5]  Out: -   Net: 903.5       Estimated Blood Loss: 2588cc       Specimens to Pathology:  Keloid abdominal wall scar       Temporary Implants: foley       Packing:  none               Patient Condition: good       Indications: Madison Rose is a 33 y.o. H2E8948 who was admitted for pyelonephritis at [redacted]w[redacted]d. On hospital day 2, Twin B had a nonreactive NST followed by a BPP of 2/8, so the decision was made to proceed with delivery via c-section.       Findings (Including unexpected complications): Significant keloid over site of  prior abdominoplasty. Extensive scarring noted subcutaneously. Normal gravid uterus. Normal fallopian tubes and ovaries bilaterally. Clear fluid upon entry into the uterus. Viable female infant weighing 3200 grams with APGARs of 8 & 9.  Viable female infant weighing 2930 grams with APGARs of 8 & 9. Anterior and posterior placentas with 3-vessel cord. Uterine atony requiring misoprostol , methergine , TXA.      Description of Procedure:     The patient was taken to the operating room and placed on the operating room table. A stop check was performed  confirming the patient and the procedure to be completed. Spinal anesthesia was administered without difficulty, and the patient was positioned in the left lateral tilt position. FHTs were noted to be in the 140s for both twins (A 140, B 145).    A foley catheter was inserted using sterile technique. The patient's perineum and vagina were prepped with chlorhexidine. The patient's abdomen was prepped with chloraprep which was allowed to fully dry for 3 minutes prior to draping in the standard fashion. The surgical pause was completed.     The patient desired scar revision of her prior abdominoplasty. An elliptical incision was made over the site of abdominoplasty keloid scar, 5 fingerbreadths above pubic symphysis. The prior scar was grasped with clamps to allow for undermining of the tissue with a scalpel. The scarred tissue was excised down to healthy subcutaneous fatty tissue. The incision was then carried down sharply to the fascia. The fascia was nicked in the midline and the incision was extended transversely. The fascia was dissected off the rectus muscle using sharp and blunt dissection.  The abdominal plication permanent suture was noted and carefully removed. The rectus muscle was divided along the midline and the peritoneum was isolated and entered bluntly. A transverse incision was made with the Bovie on the medial aspect of bilateral rectus muscles to allow for more room laterally. The incision was extended superiorly and inferiorly with good visualization of the bladder. The bladder blade was placed to protect the bladder.     A low transverse uterine incision was made and extended transversely by cephalo-caudad stretching. The amniotic fluid was noted to be clear at the time of rupture. There was delivery of a viable female infant in the vertex position. The infant was dried and stimulated on the abdomen. There was delivery of a viable female infant in the breech position: legs were delivered first  using the Pinard maneuver. With gentle pressure the legs and body gradually delivered. Once the scapula could be seen the baby was gently rotated and both arms were delivered using the Loveset maneuver. Maintaining head flexion in a modified Mariceau-smellie- veit maneuver the head was delivered without difficulty. The infants' cords were clamped and cut, and the infant was passed to the awaiting personnel. The placentas were then delivered spontaneously with the help of fundal massage.    The uterus was then exteriorized and cleared of all clots and debris. The uterine incision was re-approximated with interlocking suture of 0 Vicryl followed by a second imbricating layer of 0 Vicryl. Due to poor uterine tone, misoprostol , methergine , and TXA were given.    The uterus was then placed back into the abdominal cavity and was inspected with good hemostasis noted. Bilateral abdominal gutters were inspected with no active bleeding noted. Good hemostasis of the hysterotomy was again noted.     Attention was then turned to the fascia, which was re-approximated with a running suture of 0 Vicryl in 1 cm x 1 cm fashion. The subcutaneous layer was inspected and cautery was used to achieve hemostasis. Due to large elliptical incision with significant tension, three layers of subcutaneous re-approximation sutures were placed. Simple interrupted sutures of 0 vicryl were used for initial re-approximation. Then, a series of horizontal mattress sutures (0-vicryl) were used to further re-approximate the subcutaneous tissue with excellent reduction in incisional tension noted. Finally, the subdermal layer was closed in a running fashion with absorbable suture.  The skin was re-approximated with a running absorbable suture with a Keith needle in the usual fashion. A sterile dressing was applied.    The patient tolerated the procedure well, and was taken to the Post Anesthesia Care Unit in stable and satisfactory condition. All sponge,  needle and instrument counts were correct at the end of the procedure x2. The foley was draining clear urine at the end of the procedure.     Primary surgeon as listed above was present and scrubbed for the entire procedure.    Ronita Sharps, MD  Obstetrics & Gynecology PGY-2  Pager [phone]     Signed:  Richardson Sharps, MD  on 07/06/2024 at 5:57 PM

## 2024-07-06 NOTE — Progress Notes (Signed)
 ANTEPARTUM INPATIENT PROGRESS NOTE       LOS: 1 day     Subjective     Madison Rose is feeling okay this morning. Resting comfortably upon entering. Reports normal fetal movement. Denies LOF, VB, CTX. Still having back pain, feels like it is the same as when she got here. Also notes that when her back pain gets really bad, had some vision changes simultaneously.     Denies HA, subjective fever/chills.     Objective     Maternal vital signs  Vitals:    07/05/24 2214 07/06/24 0354   BP: 139/86 105/73   Pulse: 105 (!) 112   Resp: 16 20   Temp: 36.3 C (97.3 F) 36.4 C (97.5 F)   TempSrc: Temporal    SpO2: 98% 96%     Fetal vital signs  Doppler Rate: 158 BPM (07/06/24 0354)    Physical exam  General: NAD, well-appearing  Mental Status: Alert and oriented x 3  HEENT: Normocephalic, atraumatic  Cardiovascular: Normal heart rate  Respiratory: Unlabored respiratory effort on room air  Abdomen: Soft, without fundal tenderness  Neurological: Normal, average response (2+)  Extremities/Skin: Not assessed  Back: No CVA tenderness, paraspinal muscles moderately TTP    Laboratory Evaluation  Blood counts  Recent Labs   Lab 07/05/24  2251   WBC 5.3   Hemoglobin 9.5*   Hematocrit 29*   Platelets 212     No components found with this basename: NEUTOPHILPCT, MONOPCT, EOSPCT   Metabolic panel  Recent Labs   Lab 07/05/24  2251   Sodium 136   Potassium 3.9   Chloride 104   CO2 21   UN 6   Creatinine 0.72   Glucose 95   Calcium  8.5*      Hepatobiliary testing  Recent Labs   Lab 07/05/24  2251   Total Protein 6.7   Albumin 3.1*   AST 19   ALT 8   Bilirubin,Total 0.5   Alk Phos 132*     Coagulopathy testing  No results for input(s): PTI, INR, PTT in the last 168 hours.   GBS Testing  Lab Results   Component Value Date/Time    GBS . 05/11/2024 11:31 PM         Assessment & Plan     Madison Rose is a 33 y.o. H2E8948 at [redacted]w[redacted]d w/ di/di twin pregnancy admitted with c/f pyelonephritis. Overnight, pt remained afebrile and was able to  rest. Admit WBC 5.3. Ucx prelim neg. Renal USN unremarkable. On Ancef  1g q24h. Tylenol  1g q6h scheduled this morning for pain.     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, c/f pyelo  - Klebsiella positive on 3/12, 3/27, 4/8, 4/29, 6/30   - s/p treatment with  Macrobid  (3/14-3/19), s/p cefpodixime (4/10-4/17)  - inpatient admission 4/29 for pyelonephritis, treated with ceftriaxone   - OBT visit 6/30: received 1 time dose CTX then 7 day course of keflex  at treatment dose (500mg  q6)  - keflex  500mg  daily suppression  - 7/22: afebrile, no leukocytosis, but persistent L flank pain and Hx pyelo this pregnancy  - Abx: CTX (7/23-)    Dichorionic Diamniotic Twin Pregnancy   - USN 06/24/2024: A- vertex, maternal left, anterior placenta, AFI wnl, EFW 2639 grams (32nd percentile), B- breech, maternal right, posterior placenta, AFI wnl, EFW 2993 grams (69th percentile), 11.8% discordance  - USN 07/01/2024: A- vertex, maternal left, B- breech, maternal right. AFI wnl x2     Hx CS x1  - scheduled for  repeat 7/25, consent signed on admit    Elevated 1h GTT, declined further testing  Migraines  Sickle cell trait  Hx abdominoplasty  Rubella equivocal    Prenatal care coordination  - Prenatal labs reviewed/completed on admission.  GBS negative.  - Infant: female x2. Circumcision: not indicated.  - Feeding: breast and bottle  - PPBC: Nexplanon  - TDaP vaccine UTD  - 1hr GTT elevated, no 3hr. Paneling Bgs while inpatient    Delivery/Discharge Planning: pending clinical course    Ronnald Providence, DO, MPH  Obstetrics & Gynecology, PGY-3

## 2024-07-06 NOTE — Anesthesia Procedure Notes (Addendum)
---------------------------------------------------------------------------------------------------------------------------------------    NEURAXIAL BLOCK PLACEMENT  Single Shot Spinal    Date of Procedure: 07/06/2024 6:43 PM    Patient Location:  OR    Reason for Block: intraoperative anesthetic      Block Completion: successfully completed  CONSENT AND TIMEOUT     Consent:  Obtained per policy    Timeout: patient identified (name/DOB) , proper patient position verified, needed equipment, monitors, medications and access verified as present and functioning , allergies reviewed with patient/record  and anticoagulation/antiplatelet status reviewed  METHOD:    Patient Position: sitting    Monitoring: blood pressure and continuous pulse oximetry      Labeling: syringes appropriately labeled    Sedation Used: no            For medications used, please see MAR        Level of Sedation: none      Sterile Technique:  Hand sanitizing, hat, mask and sterile gloves    Prep: aseptic technique per protocol, povidone-iodine and site draped      Successful Approach: midline    Successful Location: L3-4    Attempts (Skin Punctures):  1  SPINAL NEEDLE:      Introducer: 20 G    Type: Pencan    Gauge: 25 G    Length: 5 in    CSF Appearance: clear  OBSERVATIONS:    Block Completion:  Successfully completed      Paresthesia: none      Neuraxial Blood: blood not aspirated      Sensory Levels: bilateral      Epidural sensory level: T2.        Motor Block: dense      Patient Reaction to Block: tolerated procedure well, vitals remained stable and fetal stability    STAFF     Performed by Babara Dunnings, MD      Attest: I was present for the entire procedure   Francella Ax, MD  ----------------------------------------------------------------------------------------------------------------------------------------

## 2024-07-06 NOTE — Plan of Care (Signed)
 Problem: Maternal Well-Being  Goal: Patient and Family Centered Care  Outcome: Maintaining  Goal: Optimize Maternal Outcomes  Outcome: Maintaining  Goal: Maximize Maternal Comfort and Decrease Maternal Stress  Outcome: Maintaining  Goal: Reduce the risk of infection  Outcome: Maintaining     Problem: Obstetric Recovery & Postpartum s/p Delivery  Goal: Breasts are soft with nipple integrity intact  Outcome: Maintaining  Goal: Fundus firm at midline  Outcome: Maintaining  Goal: Moderate rubra freeflow, no purulent discharge, no foul smelling lochia  Outcome: Maintaining  Goal: Appropriate behavior observed  Outcome: Maintaining  Goal: Ambulates independently  Outcome: Maintaining  Goal: Achieve normal elimination following delivery  Outcome: Maintaining     Problem: Maternal Cardiovascular/Circulation in Pregnancy  Goal: Maximizing maternal circulation and minimizing Deep Vein Thrombosis (DVT)  Outcome: Maintaining     Problem: Cesearean Delivery  Goal: Operative site will remain intact and free from infection  Outcome: Maintaining     Problem: Pain  Goal: Patient's pain/discomfort is manageable  Outcome: Maintaining  Goal: Report decrease in pain level  Description: Alleviation of pain or a reduction in pain to a level of comfort that is acceptable to the patient.  Outcome: Maintaining     Problem: Knowledge Deficit  Goal: Patient/S.O. demonstrates understanding of disease process, treatment plan, medications, and discharge instructions.  Outcome: Maintaining     Problem: Lactation utilized for newborn feeding  Goal: Demonstrates appropriate breast feeding techniques  Outcome: Maintaining

## 2024-07-06 NOTE — Anesthesia Case Conclusion (Signed)
 CASE CONCLUSION  Emergence  Assessment:  Routine  Transport  Directly to: Floor  Position:  Recumbent  Patient Condition on Handoff  Level of Consciousness:  Alert/talking/calm  Patient Condition:  Stable  Handoff Report to:  RN

## 2024-07-06 NOTE — Anesthesia Preprocedure Evaluation (Addendum)
 Anesthesia Pre-operative History and Physical for Madison Rose    Highlighted Issues for this Procedure:  Madison Rose 33 y.o. female [phone] [redacted]w[redacted]d with Breech presentation, fetus 1 of multiple gestation [O32.1XX1]  History of cesarean section complicating pregnancy [O34.219] presenting for CESAREAN SECTION (Abdomen) by Lorren Louise Jansky, MD scheduled for 90 minutes    H&P per ob note  Madison Rose is a 33 y.o. H2E8948 at [redacted]w[redacted]d, patient has been experiencing the following   Back pain:  She has history of pyelonephritis this pregnancy (4/29), and reports her upper back pain (L>R) is the same pain. It has been present at a low level for months, but when she awoke around 7am it was much more severe. The pain is constant, cannot identify any exacerbating or alleviating factors. Denies any trauma or falls.     Abdominal tightening:  Present since last night. Reports episodes of abdominal tightening that last 2-5 minutes at a time before resolving. Hard to tell exactly how often the episodes occur - tried to time them, once they were 30 minutes apart, then 15 minutes apart. Associated with pain in her low back/tailbone.      Substernal pain:  Pain under sternum on L side, worse when she takes a deep breath or when she lays down. Has been present (although dull) for weeks, but reports this morning became sharp like someone is stabbing her.     Dyspnea:  Endorses worsening dyspnea when laying flat. Reports she has been sleeping elevated with several pillows throughout this pregnancy. Has been short of breath for weeks, When she lays flat she coughs and sputters. Worsening LE edema over past few days.       Complications this pregnancy  Recurrent UTIs, pyelonephritis in pregnancy  - Klebsiella positive on 3/12, 3/27, 4/8, 4/29, 6/30   - s/p treatment with  Macrobid  (3/14-3/19), s/p cefpodixime (4/10-4/17)  - inpatient admission 4/29 for pyelonephritis, treated with ceftriaxone   - OBT visit 6/30: received 1 time  dose CTX then 7 day course of keflex  at treatment dose (500mg  q6)  - keflex  500mg  daily suppression     Dichorionic Diamniotic Twin Pregnancy   - USN 06/24/2024: A- vertex, maternal left, anterior placenta, AFI wnl, EFW 2639 grams (32nd percentile), B- breech, maternal right, posterior placenta, AFI wnl, EFW 2993 grams (69th percentile), 11.8% discordance  - USN 07/01/2024: A- vertex, maternal left, B- breech, maternal right. AFI wnl x2.      AMA      Sickle cell trait     Hx of C/S  - records unavailable, 2009 in P6 at 40 weeks for fetal intolerance   -      Migraines     Hx of abdominoplasty, unable to obtain records     SMA carrier     Elevated 1 hr GTT  - declined further testing     Anemia       Estimated body mass index is 40.11 kg/m as calculated from the following:    Height as of 07/01/24: 1.753 m (5' 9).    Weight as of 07/01/24: 123.2 kg (271 lb 9.6 oz).    Allergies: erythromycin , elemental sulfur, clindamycin, lincomycin  Placenta: see above  Prior C/S: see above    Anticoagulation: ASA 81    Labs                  07/06/24     07/05/24  0258          2251          Hemoglobin    --          9.5*          Hematocrit    --          29*           Platelets     --          212           ABO RH Bloo* O RH POS      --           Antibody: n  T/S completed: yes    Medical History  Asthma: no  Hypertension: no  Active bleeding/bleeding disorders: none    Prior abdominal surgical history: see above    Problem List:  2025-07: Pyelonephritis  2025-06: Breech presentation, fetus 1 of multiple gestation  2025-06: History of cesarean section complicating pregnancy  2025-05: Pelvic pain  2025-04: Pyelonephritis affecting pregnancy  2025-04: Pyelonephritis  2025-03: Abdominal pain affecting pregnancy  2025-03: Heartburn during pregnancy  2025-03: H/O sickle cell trait  2025-03: AMA (advanced maternal age) multigravida 35+  2025-03: Anemia in pregnancy, second trimester  2025-03: Elevated glucose  tolerance test  2025-02: Fall  2025-01: Dichorionic diamniotic twin pregnancy in first trimester  2025-01: May be eligible for HPV vaccination PPM       Electrophysiology/AICD/Pacer:  EKG 06/13/24  NSR    .  Anesthesia Evaluation Information Source: patient, records     ANESTHESIA HISTORY     Denies anesthesia history         PULMONARY    + Shortness of Breath  Pertinent(-):  No asthma    CARDIOVASCULAR  Pertinent(-):  No hypertension    GI/HEPATIC/RENAL     Pertinent(-):  No nausea, vomiting or pancreatic issues   Comment: Pyelonephritis on antibiotics ATC   NEURO/PSYCH/ORTHO    + Headaches          migraines  Pertinent(-):  No syncope, seizures or cerebrovascular event    ENDO/OTHER  Pertinent(-):  No diabetes mellitus    HEMATOLOGIC    + Blood dyscrasia          anemia  Pertinent(-):  No coagulopathy or anticoagulants/antiplatelet medications     Physical Exam    Airway            Mouth opening: normal            Mallampati: II            TM distance (fb): >3 FB            TM distance (cm): >5            Neck ROM: full     Cardiovascular           Rhythm: regular           Rate: normal         Pulmonary     breath sounds clear to auscultation    Mental Status     oriented to person, place and time         ________________________________________________________________________  PLAN  ASA Score  2  Anesthetic Plan spinal      ;  Airway Manipulation (none); Line ( use current access); Monitoring (standard ASA); Positioning (sitting); Pain (caudal/epidural- Bupivacaine  spinal); PostOp (PACU)Standard Attestation    Informed Consent     Risks:  Risks discussed were commensurate with the plan listed above with the following specific points: N/V, aspiration, hypotension, headache, failed block, infection, dizziness, unsteadiness and fatigue, Damage to: nerves and blood vessels, allergic Rx and unexpected serious injury, Risk of conversion to GA with ETT.    Anesthetic Consent:         Anesthetic plan (and risks  as noted above) were discussed with patient    Blood products Consent:        Use of blood products discussed with: patient and they consented    Plan also discussed with team members including:       attending    Responsible Anesthesia Provider Attestation:  I attest that the patient or proxy understands and accepts the risks and benefits of the anesthesia plan. I also attest that I have personally performed a pre-anesthetic examination and evaluation, and prescribed the anesthetic plan for this particular location within 48 hours prior to the anesthetic as documented. Larraine Almarie Limerick, MD  07/06/24, 6:05 PM

## 2024-07-06 NOTE — Anesthesia Postprocedure Evaluation (Signed)
 Anesthesia Post-Op Note    Patient: Madison Rose    Procedure(s) Performed:  Procedure Summary  Date:  07/06/2024 Anesthesia Start: 07/06/2024  6:12 PM Anesthesia Stop: 07/06/2024  9:02 PM Room / Location:  S_OR_OB-A / [facility] L&D   Procedure(s):  CESAREAN SECTION Diagnosis:  Breech presentation, fetus 1 of multiple gestation [O32.1XX1]  History of cesarean section complicating pregnancy [O34.219] Surgeon(s):  Fleeta Welford Bradley, MD  Myrna Perkins, MD  Claudene Dire, MD Responsible Anesthesia Provider:  FRANCELLA AX, MD         Recovery Vitals  BP: (!) 163/67 (07/06/2024  9:46 PM)  Heart Rate: 104 (07/06/2024  9:46 PM)  Resp: 17 (07/06/2024  9:33 PM)  Temp: 36.3 C (97.3 F) (07/06/2024  9:03 PM)  SpO2: 100 % (07/06/2024  9:46 PM)  0-10  Pain Scale: 6 (07/06/2024  9:03 PM)    Anesthesia type:  spinal  Complications Noted During Procedure or in PACU:  None   Comment:    Patient Location:  Labor and Delivery  Level of Consciousness:    Recovered to baseline  Patient Participation:     Able to participate  Temperature Status:    Normothermic  Oxygen Saturation:    Within patient's normal range  Cardiac Status:   within patient's normal range  Fluid Status:    Stable  Airway Patency:     Yes  Pulmonary Status:    Baseline  Neuraxial Block Evaluation:    Block resolving  Pain Management:    Adequate analgesia  Nausea and Vomiting:  None    Post Op Assessment:    Tolerated procedure well  Responsible Anesthesia Provider Attestation:  All indicated post anesthesia care provided   -

## 2024-07-06 NOTE — Anesthesia Procedure Notes (Signed)
---------------------------------------------------------------------------------------------------------------------------------------    AIRWAY   GENERAL INFORMATION AND STAFF       Date of Procedure: 07/06/2024 6:47 PM  CONDITION PRIOR TO MANIPULATION     Current Airway/Neck Condition:  Normal        For more airway physical exam details, see Anesthesia PreOp Evaluation  AIRWAY METHOD     Mask Difficulty Assessment:  0 - not attempted    Number of Attempts at Approach:  1    Number of Other Approaches Attempted:  0  FINAL AIRWAY DETAILS    Final Airway Type:  Nasal cannula    Head position required to avoid obstruction:  Neutral    Insertion Site:  Left naris and right naris  ----------------------------------------------------------------------------------------------------------------------------------------

## 2024-07-07 ENCOUNTER — Other Ambulatory Visit: Payer: Self-pay

## 2024-07-07 ENCOUNTER — Other Ambulatory Visit
Admission: RE | Admit: 2024-07-07 | Discharge: 2024-07-07 | Disposition: A | Discharge status: Home / Self Care | Payer: MEDICAID | Source: Ambulatory Visit

## 2024-07-07 DIAGNOSIS — D62 Acute posthemorrhagic anemia: Secondary | ICD-10-CM | POA: Insufficient documentation

## 2024-07-07 DIAGNOSIS — Z98891 History of uterine scar from previous surgery: Secondary | ICD-10-CM

## 2024-07-07 DIAGNOSIS — O321XX1 Maternal care for breech presentation, fetus 1: Secondary | ICD-10-CM

## 2024-07-07 LAB — CBC
Hematocrit: 30 % — ABNORMAL LOW (ref 34–49)
Hematocrit: 27 % — ABNORMAL LOW (ref 34–49)
Hemoglobin: 9.8 g/dL — ABNORMAL LOW (ref 11.2–16.0)
Hemoglobin: 9 g/dL — ABNORMAL LOW (ref 11.2–16.0)
MCV: 91 fL (ref 75–100)
MCV: 94 fL (ref 75–100)
Platelets: 199 THOU/uL (ref 150–450)
Platelets: 210 THOU/uL (ref 150–450)
RBC: 3.3 MIL/uL — ABNORMAL LOW (ref 4.0–5.5)
RBC: 2.9 MIL/uL — ABNORMAL LOW (ref 4.0–5.5)
RDW: 13.4 % (ref 0.0–15.0)
RDW: 13.4 % (ref 0.0–15.0)
WBC: 10 THOU/uL (ref 3.5–11.0)
WBC: 8.7 THOU/uL (ref 3.5–11.0)

## 2024-07-07 LAB — COMPREHENSIVE METABOLIC PANEL
ALT: 8 U/L (ref 0–35)
AST: 30 U/L (ref 0–35)
Albumin: 2.9 g/dL — ABNORMAL LOW (ref 3.5–5.2)
Alk Phos: 118 U/L — ABNORMAL HIGH (ref 35–105)
Anion Gap: 12 (ref 7–16)
Bilirubin,Total: 0.5 mg/dL (ref 0.0–1.2)
CO2: 20 mmol/L (ref 20–28)
Calcium: 8.1 mg/dL — ABNORMAL LOW (ref 8.8–10.2)
Chloride: 100 mmol/L (ref 96–108)
Creatinine: 0.58 mg/dL (ref 0.51–0.95)
Glucose: 70 mg/dL (ref 60–99)
Lab: <4 mg/dL — ABNORMAL LOW (ref 6–20)
Potassium: 3.9 mmol/L (ref 3.3–5.1)
Sodium: 132 mmol/L — ABNORMAL LOW (ref 133–145)
Total Protein: 5.9 g/dL — ABNORMAL LOW (ref 6.3–7.7)
eGFR BY CREAT: 120

## 2024-07-07 LAB — MEDICALLY URGENT HIV-1/2 ANTIBODY SCREEN WITH CONFIRMATION: Rapid HIV 1&2: NEGATIVE

## 2024-07-07 LAB — FIBRINOGEN: Fibrinogen: 333 mg/dL (ref 172–409)

## 2024-07-07 MED ORDER — IBUPROFEN 600 MG PO TABS *I*
600.0000 mg | ORAL_TABLET | Freq: Four times a day (QID) | ORAL | 0 refills | Status: AC | PRN
Start: 2024-07-07 — End: ?
  Filled 2024-07-07: qty 60, 15d supply, fill #0

## 2024-07-07 MED ORDER — POLYETHYLENE GLYCOL 3350 PO POWD *I*
17.0000 g | Freq: Every day | ORAL | 0 refills | Status: DC | PRN
  Filled 2024-07-07: qty 510, 30d supply, fill #0

## 2024-07-07 MED ORDER — NIFEDIPINE 30 MG PO TB24 *I*
30.0000 mg | ORAL_TABLET | Freq: Every day | ORAL | 0 refills | Status: DC
Start: 2024-07-08 — End: 2024-07-11
  Filled 2024-07-07: qty 30, 30d supply, fill #0

## 2024-07-07 MED ORDER — OXYCODONE HCL 5 MG PO TABS *I*
5.0000 mg | ORAL_TABLET | ORAL | 0 refills | Status: DC | PRN
  Filled 2024-07-07: qty 10, 2d supply, fill #0

## 2024-07-07 MED ORDER — NIFEDIPINE CR OSMOTIC 30 MG PO TB24 *I*
30.0000 mg | ORAL_TABLET | Freq: Every day | ORAL | Status: DC
Start: 2024-07-07 — End: 2024-07-08
  Administered 2024-07-07 – 2024-07-08 (×2): 30 mg via ORAL
  Filled 2024-07-07 (×2): qty 1

## 2024-07-07 MED ORDER — CARBOPROST TROMETHAMINE 250 MCG/ML IM SOLN *I*
INTRAMUSCULAR | Status: AC
Start: 2024-07-07 — End: 2024-07-07
  Administered 2024-07-07: 250 ug via INTRAMUSCULAR
  Filled 2024-07-07: qty 1

## 2024-07-07 MED ORDER — MISOPROSTOL 200 MCG PO TABS *I*
800.0000 ug | ORAL_TABLET | Freq: Once | ORAL | Status: AC
Start: 2024-07-07 — End: 2024-07-07
  Administered 2024-07-07: 800 ug via RECTAL

## 2024-07-07 MED ORDER — GABAPENTIN 300 MG PO CAPSULE *I*
300.0000 mg | ORAL_CAPSULE | Freq: Three times a day (TID) | ORAL | 0 refills | Status: DC | PRN
  Filled 2024-07-07: qty 30, 10d supply, fill #0

## 2024-07-07 MED ORDER — LIDOCAINE 4 % EX PATCH *I*
1.0000 | MEDICATED_PATCH | CUTANEOUS | Status: DC
Start: 2024-07-07 — End: 2024-07-11
  Administered 2024-07-07: 1 via TRANSDERMAL
  Filled 2024-07-07 (×6): qty 1

## 2024-07-07 MED ORDER — LIDOCAINE 4 % EX PATCH *I*
1.0000 | MEDICATED_PATCH | CUTANEOUS | 0 refills | Status: DC | PRN
  Filled 2024-07-07: qty 10, 10d supply, fill #0

## 2024-07-07 MED ORDER — LOPERAMIDE HCL 2 MG PO CAPS *I*
4.0000 mg | ORAL_CAPSULE | Freq: Four times a day (QID) | ORAL | Status: DC | PRN
Start: 2024-07-07 — End: 2024-07-11

## 2024-07-07 MED ORDER — GABAPENTIN 300 MG PO CAPSULE *I*
300.0000 mg | ORAL_CAPSULE | Freq: Three times a day (TID) | ORAL | Status: DC | PRN
Start: 2024-07-07 — End: 2024-07-11
  Administered 2024-07-08 – 2024-07-10 (×6): 300 mg via ORAL
  Filled 2024-07-07 (×6): qty 1

## 2024-07-07 MED ORDER — SIMETHICONE 80 MG PO CHEW *I*
80.0000 mg | CHEWABLE_TABLET | Freq: Four times a day (QID) | ORAL | 0 refills | Status: DC | PRN
  Filled 2024-07-07: qty 100, 25d supply, fill #0

## 2024-07-07 MED ORDER — CARBOPROST TROMETHAMINE 250 MCG/ML IM SOLN *I*
250.0000 ug | Freq: Once | INTRAMUSCULAR | Status: AC
Start: 2024-07-07 — End: 2024-07-07

## 2024-07-07 MED ORDER — OXYCODONE HCL 5 MG PO TABS *I*
5.0000 mg | ORAL_TABLET | Freq: Once | ORAL | Status: AC
Start: 2024-07-07 — End: 2024-07-07
  Administered 2024-07-07: 5 mg via ORAL
  Filled 2024-07-07: qty 1

## 2024-07-07 NOTE — Anesthesia Postprocedure Evaluation (Signed)
 Anesthesia Post-Op Note    Patient: Madison Rose    Procedure(s) Performed:  Procedure Summary  Date:  07/06/2024 Anesthesia Start: 07/06/2024  6:12 PM Anesthesia Stop: 07/06/2024  9:02 PM Room / Location:  S_OR_OB-A / [facility] L&D   Procedure(s):  CESAREAN SECTION Diagnosis:  Breech presentation, fetus 1 of multiple gestation [O32.1XX1]  History of cesarean section complicating pregnancy [O34.219] Surgeon(s):  Fleeta Welford Bradley, MD  Myrna Perkins, MD  Claudene Dire, MD Responsible Anesthesia Provider:  FRANCELLA AX, MD         Recovery Vitals  BP: (!) 132/94 (07/07/2024 11:12 AM)  Heart Rate: 102 (07/07/2024 11:12 AM)  Resp: 18 (07/07/2024 11:12 AM)  Temp: 36.7 C (98.1 F) (07/07/2024 11:12 AM)  SpO2: 97 % (07/07/2024 11:12 AM)  0-10  Pain Scale: 8 (07/07/2024  2:00 PM)    Anesthesia type:  spinal  Complications Noted During Procedure or in PACU:  None   Comment:    Patient Location:  Patient location: Postpartum unit.  Level of Consciousness:    Recovered to baseline  Patient Participation:     Able to participate  Temperature Status:    Normothermic  Oxygen Saturation:    Appropriate for condition  Cardiac Status:   appropriate for condition  Fluid Status:    Stable  Pulmonary Status:    Baseline  Neuraxial Block Evaluation:    No residual motor or sensory symptoms  Pain Management:    Satisfactory to patient  Nausea and Vomiting:    Controlled    Post Op Assessment:    Tolerated procedure well  Responsible Anesthesia Provider Attestation:  All indicated post anesthesia care provided  Comments:    Spinal blockade has resolved. Pain controlled. Ambulating independently. Denies headache or significant back pain. No questions or concerns.       -

## 2024-07-07 NOTE — Discharge Instructions (Signed)
 We recommend that you check your blood pressure twice per day when you are sitting in a relaxed position. Write down your blood pressures in a log to share at your next visit.    If your blood pressure is:  Top number 160 or more OR  Bottom number 110 or more  Call your health care provider right away!       If your blood pressure is:  Top number is 150 or more OR  Bottom number is 100 or more  Repeat your blood pressure in one hour. If your blood pressure remains at this value, call your health care provider.    If your blood pressure is:  Top number between 100 and 149 AND  Bottom number between 60 and 99  Keep checking your blood pressures twice daily and write them down.    If you are on medication for your blood pressure and your blood pressure is:  Top number less than 100 OR  Bottom number less than 60  Call your health care provider to adjust your medication dose.    Also, please call the office if you have concerning symptoms like a severe headache, vision changes, pain in your upper belly (especially on the right side), chest pain or shortness of breath.      Le recomendamos que se tome la presin arterial dos veces al da cuando est sentado en una posicin relajada. Anote sus presiones arteriales en un registro para ensearlas a su proveedor en su prxima consulta.    Al tomarse la presin, si tiene el:  [place] en 160 o ms O  Nmero de abajo en 110 o ms  Llame inmediatamente a su mdico.    Al tomarse la presin, si tiene el:  [place] en 150 o ms O  [place]mero de [place]ajo en 100 o ms  Vuelva a tomarse la presin arterial dentro de una hora. Si su presin arterial se mantiene en este valor, llame a su mdico.    Al tomarse la presin, si tiene el:  [place] entre 100 y 149 Y  Nmero de [place] 60 y 99  Siga tomndose la presin dos veces al da y Scientific laboratory technician.    Si est tomando medicamentos para la presin arterial y si tiene el:  [place] en menos de 100 O  Nmero de  abajo en menos de 60  Llame a su mdico para que le ajuste la dosis del [place].    Tambin, llame al consultorio si tiene sntomas preocupantes como dolor de cabeza intenso, cambios en la visin, dolor en la parte superior del abdomen (especialmente en el lado derecho), dolor en el pecho o falta de aire.      POSTPARTUM CESAREAN DELIVERY INSTRUCTIONS    General Activity:  On the day of discharge, go home and rest. Increase activity such as walking and stair climbing gradually. No strenuous activity or heavy lifting should be done for several weeks. Do not drive for 1-2 weeks after your delivery. It is easy to have an accident when you are tired or not moving as quickly due to pain.  You also should not drive for as long as you are taking narcotic pain medication.  Travel is fine after 2 weeks, but if on a long trip, get out and walk every 2 hours to maintain circulation. Often your feet and ankles will become swollen during the week after delivery. If you had IV fluids, you may  swell for several weeks.      We recommend pelvic rest (nothing in the vagina) for at least 2 weeks, until your bleeding stops, or as otherwise instructed by your physician. This means no sexual intercourse, tampons, douching or deep baths.  Sitz bathes are permitted (a few inches of warm water  only in the tub).     Emotional Changes: It is normal to experience some mild sadness or emotional swings in the first week or two after delivery. If your symptoms do not get better after 2 weeks or you are experiencing worsening sadness, mood swings, thoughts of hurting yourself or others, including your baby, please call us  right away. Postpartum depression is real and treatable, but it is easier to treat the earlier it is detected.    Employment:  You may return to work 6-8 weeks after delivery. This is standard [place].      Incisional Care:  Keep your incision clean and dry.  You should let the warm soapy water  fall over your incision  in the shower.  You should not scrub the area.  If your incision is under your abdomen, gently lift your abdomen to allow the water  into the area.  Pat dry with a towel when you get out of the shower or dry with a hairdryer on COOL only.  If you have Steri-strips (tape bandages) over your incision, they should be removed after one week if they haven't fallen off on their own.  If they become dirty or wet, they can be removed earlier.  Neosporin and other products/creams should not be applied to the incision until directed by your physician.    If you notice redness, swelling, increased pain, or drainage from the incision, call your provider immediately.  It can sometimes be difficult to see your incision postpartum.  You may need a friend/family member or a mirror to see your incision.  Look at your incision daily at least for the first 2 weeks following your delivery.      Hemorrhoids:  Keep the perineal area clean with baths, showers or Sitz baths. Anesthetic numbing spray or similar ointment is available over the counter and can be applied as needed to stitches. If you have lacerations, they will heal within the next few weeks and the stitches will dissolve on their own. Preparation H, Anusol  and Tucks are fine for hemorrhoids.     Breast Care:    If you are breastfeeding, remember that it is a new learning experience for you and your baby. Breastfeeding can come very easily for some new moms and for others it's ok if it needs patience and perseverance. Our lactation consultants have already given you lots of information, and here are a few specific tips to help you take care of your breasts:   - Wear a comfortable, supportive bra without underwires   - Take good care of yourself so you have lots of time and energy to bond with and feed   your baby. Try to rest as much as possible in between feedings.    - Make sure you have a comfortable, deep latch every time you feed your baby   - If your breasts become  engorged, you can gently massage them, take a warm shower   or bath, use warm or cold packs for comfort, or even take some ibuprofen .    - For sore or dry nipples, try to use a little breast milk around your nipple and areola   (pigmented  area around breasts) after feeding. Silicone nipple soothing pads or   cool packs can also help. If your nipples are very sore, contact your healthcare   provider as we may need to help you find a more comfortable latch.    - If you feel stringy full areas in your breasts, you may have some clogged ducts (feel like   stringy clumps). These can be helped by massage and a warm shower or wash   cloth on the breast before you nurse.    For women who are breastfeeding or pumping breastmilk, [facility] is here for help and support when you need it most!    Check out our website: [email or web address]      Call [facility] at [phone] if you need to make an appointment with a provider or lactation consultant specializing in breastfeeding.    Patients are seen in the Breastfeeding Medicine offices at [address] For patients using [name]) for their pediatrician, services are also available in the Ambulatory building at [facility] (6th floor).       For parents who are breastfeeding or pumping and want to meet up with other parents for support and help, come to the [facility]!   FREE drop-in support   No appointment needed!    Share and connect with other breastfeeding women   Get support from lactation specialists:  [facility] @[facility] - [address]--  on the 1st and 3rd Wednesday of every month from 1-3 pm.   [facility] @[facility]--[address]--  on 2nd Tuesday of every month from 10:30am -12 noon  [employer].  [address], [place]--Wednesdays from 10-11:30 am  More info and  locations at [email or web address]       Breast infections can occur whether you are nursing or not. Symptoms include fever, chills, and painful areas on your breast. Usually antibiotics are needed, so call us  during the day with your pharmacy number. Gentle massage can help as well. If you are nursing, keep nursing while you contact your provider. If you need to use pain medications other than what we list in these instructions, check with your OB provider or pediatrician's office to determine if they are safe for breastfeeding.    If you are feeding formula only, a snug bra can help to prevent pain and engorgement. If you do get pain or swelling, you can apply ice packs to your breasts and take Ibuprofen . You may need to do this for several days. We do not use medication of any kind to dry up your breast milk.    Abdominal Cramps:  After birth pains can be severe, especially with nursing and after the birth of the second or more child. They rarely last more than 4 days.    Pain Medication: Ibuprofen  or acetaminophen  are both safe to take while nursing and are usually effective at managing cramps.  Narcotic pain medication (Oxycodone , Percocet, Dilaudid ) may be needed for pain not relieved with over-the-counter medications.  If you are taking a narcotic with Acetaminophen  in it, do not take any additional Tylenol .  Narcotic pain medication can cause lightheadedness/dizziness, nausea/vomiting, confusion, sleepiness, and constipation.  Avoid taking these medications when home alone with the infant until you know how you will respond to them.  Do not drive when taking these medications.  You should take stool softners (Colace, Miralax ) while  taking these medications.    Vitamins and Iron :  Continue the vitamins throughout nursing or for 1 month if not nursing. Continue iron  for 1 month.    Bathing:  Showers and shallow baths are fine. No douching. Deep baths are also not recommended.    Constipation:  Stool softeners  such as Colace, Metamucil, Senokot, Miralax , or generic are fine to use for mild constipation. Use milk of magnesia as necessary for persistent constipation.       Menstruation:  Bleeding is expected for several weeks. It may be red, blackish with clots. It changes to pink and then yellow-gray.  Frequently, the discharge becomes red again. If the bleeding gets heavy again, you need to rest more.     The return of a real menstrual period varies. For non-nursing mothers, it is usually between 4-10 weeks. For nursing mothers, it can take over a year.  Several months of periods are usually required before cycles become regular. Bleeding may increase after you go home, due to increased activities.     Sexual Intercourse:  It takes varying times for pain from vaginal tears/episiotomies, hemorrhoids and swelling to go away so that you are comfortable. We recommend not resuming intercourse for at least 2 weeks, until your bleeding stops, or as otherwise instructed by your physician.   Nursing causes vaginal dryness and lubrication is often required. Remember, the possibility of becoming pregnant exists, even while nursing. You ovulate 2 weeks before the return of the first period, so you may become pregnant before your first period.      Contraception:  We recommend at least 18 months between pregnancies for you and your baby's health. Even if you are breastfeeding, you can become pregnant if you are not using a reliable and consistent form of contraception. We do not recommend using estrogen-containing products for the first several weeks after delivery due to the elevated risk of blood clots or while breastfeeding. Progesterone -only products, however, are perfectly safe immediately postpartum and do not interfere with breastfeeding.     Postpartum Visit:  Call our office to schedule an appointment. This visit will be scheduled 6 weeks after you delivered.  Your physician may ask you to schedule an incision check in the  office one week following delivery.  This will be specified on your discharge paperwork.      Call if the following occur:   1. Fever of 100.4 degrees F or severe chills. Check your temperature before calling, if possible. You may feel hot but you cannot determine if you have a true fever without a thermometer.    2. Excessively heavy vaginal bleeding.   3. Redness, swelling, or drainage from your incision.   4. Extreme urinary frequency and burning with urination.   5. Swelling or tenderness in one area of the breast.   6. Marked depression or anxiety.    7. Severe pain not improved with pain medication.   8. Chest pain or shortness or breath.   9. Significant swelling in one leg more than the other.   10. Persistent headache or visual changes.   11. Any other questions or concerns.        Please refer to your [employer] packet for any other questions not addressed here.

## 2024-07-07 NOTE — Addendum Note (Signed)
 Addendum  created 07/07/24 1619 by Denette Pfeiffer, MD    Clinical Note Signed

## 2024-07-07 NOTE — Lactation Note (Incomplete Revision)
 This note was copied from a baby's chart.  Lactation Consultant Visit   Patient: Madison Rose            AGE: 35 days              Corrected GA: 37w 6d  MRN: [id number]     Maternal Information   Birth Parent Name:   Madison Rose   Type of Visit : Follow up visit    Parent Feeding Goals: Breastfeeding, Pump and bottle feed maternal/ human milk  Lactate for: As long as possible (From Delivery Summary)           Child's Current Feeding Type : Breastfeeding, Formula        Pump for Home: Has new pump, Medela (states she bought her new pump out of pocket, did not use insurance benefit)    Birth Parent Medications : Compatible with lactation               Right Breast Assessment: Soft, Pendulous  Left Breast Assessment: Soft, Pendulous    Right Nipple Assessment : Everted, Intact, Flat/ Minimally everted (nipple smooth and rounded with no obvious nipple pores, per parent, with previous breastfeeding experience, breasts did gets engorged but she was unable to express milk. she plans to try to pump/ breastfeed on left only)    Left Nipple Assessment : Everted, Intact          Parent/Newborn Medical Factors That May Impact Lactation: C-section Delivery, Epidural, Maternal age above 37, Excessive blood loss (Sickle Cell Anemia)    Newborn Assessment   Infant Growth:    Birth Weight: 3200 g (7 lb 0.9 oz)   Today's Wt: 3200 g (7 lb 0.9 oz) (Filed from Delivery Summary)   Percent Weight Change: 0 %          Impression of Breast Feeding: Unable to assess at this visit      Interventions and Instructions   Met with parent at bedside in/on [facility] [id number]   Goal of LC Visit : Pumping/Hand expression assistance, Milk supply management (Comment)    Met with parent in her room on 312, Baby A in room with parent , Baby B in NBN.  Encouraged frequent, cue based feedings and offered ongoing LC support.    Addendum 1445: parent requested assistance with latch Baby A, infant fully asleep, would not wake. Parent stated she would like to pump and  offer Expressed milk, provided her with manual pump and she expressed 10 ml in only a few minutes of hand pumping, encouraged her to offer this to baby now, attempt latch again later.     Reviewed: General Education Provided : Lactogensis/Supply & Demand, Modes of Medela: Initiate program, Pumping frequency  Latch Specific Teaching : None done this visit    Please see Lactation flowsheet for further details.    Plan   Follow-up with lactation daily while dyad inpatient or by parental request.  Time Spent during Visit : 10-20 mins    Feeding Plan:   Recommended Feeding Plan: Ad lib breastfeeding, Strongly recommend hand expression of extra MBM after BF and offer all to baby    Plan with Parent: Return as needed         Corean Rossetti, RN, [facility]   Lactation Consultant

## 2024-07-07 NOTE — Progress Notes (Signed)
 Obstetrics Progress Note     S:  Called to see patient for sudden heavy bleeding. Prior to this uterine bleeding very minimal over last 24 hours since surgery and tone firm with all fundal checks per nursing. She is feeling well other than noting the heavy bleeding. No LH/presyncope.    O:  07/07/24 2002 106/68 35.7 C (96.3 F) TEMPORAL 101 18 98 %   07/07/24 1921 116/73 -- -- 96 16 96 %        Lab 07/07/24  1846 07/07/24  0405   WBC 8.7 10.0   Hemoglobin 9.0* 9.8*   Hematocrit 27* 30*   Platelets 210 199     ~1000 mL blood loss    Bedside ultrasound: thin fundal uterine stripe, lower uterine segment with multiple clots including one large clot at most distal portion of lower uterine segment    Discussed option to attempt manual sweep. Cervix only 1.5 cm dilated so unable to perform sweep.    General: well appearing  Fundus: firm at umbilicus-1 with small trickle of blood out, no free flow    A/P:   33 yo [id number] ~24 hours from rLTCS with di-di twins c/b PPH 2.5 L now with delayed PPH of additional 1L. Misoprostol  and hemabate  given. Tone improved significantly. Stat H/H stable from prior though discussed that could be still equilibrating. Discussed blood transfusion if needed and she is amenable. Will continue to monitor bleeding. Discussed that if uterotonics given are not enough next step would be D&S. Recommended remain NPO for the time being until bleeding deemed to be stable.    Damien Novak MD   Obstetrics & Gynecology PGY-4

## 2024-07-07 NOTE — Progress Notes (Signed)
 OBSTETRICS CESAREAN DELIVERY POST-OP PROGRESS NOTE   Postpartum Day: 1    Subjective     Madison Rose is doing well this morning.  Pain is well-controlled with current regimen.  Tolerating liquids without nausea/vomiting, hasn't tried food yet.  Flatus absent. She  has not ambulated.  Denies chest pain, SOB, or lightheadedness.  Appropriate vaginal bleeding.  Babies are in NICU.      Objective     Vitals:    07/06/24 2246 07/06/24 2247 07/07/24 0007 07/07/24 0350   BP:  (!) 117/92 (!) 148/96 (!) 141/95   BP Location:  Left arm     Pulse: (!) 111 (!) 121 (!) 112 104   Resp:  17 20 18    Temp:  36.8 C (98.2 F) 36 C (96.8 F) 36.4 C (97.5 F)   TempSrc:  Temporal     SpO2: 97% 98% 97% 97%       Urine Output: 600 cc over last 8 hours (0.6 cc/kg/hr)    Mental Status: alert and oriented x 3  Cardiovascular: regular rate, warm and well perfused  Respiratory: normal work of breathing, on RA  Abdomen: soft, appropriately tender, non-distended  Incisions: cd, pressure dressing in place  Fundus: Fundus firm at umbilicus  Extremities/Skin: SCDs on and running      Labs     Recent Labs   Lab 07/06/24  2254 07/05/24  2251   Hematocrit 30* 29*       Rubella IgG AB (no units)   Date Value   02/29/2024 0.8       ABO RH Blood Type (no units)   Date Value   07/06/2024 O RH POS           Information for the patient's newborn:  Madison, Rose [[id number]]     Newborn ABO RH   Date Value Ref Range Status   07/06/2024 A RH POS  Final      Information for the patient's newborn:  Madison, Rose [[id number]]     Newborn ABO RH   Date Value Ref Range Status   07/06/2024 A RH POS  Final       Assessment & Plan     [name] is a 33 y.o. [id number] on POD# 1 s/p repeat      Focht, Girl A [[id number]]   C-Sec, Low Transverse      [name], Girl B [[id number]]   C-Sec, Low Transverse at [redacted]w[redacted]d for BPP 2/10 for Twin B complicated by PPH 2.6L.  Doing well.    Routine postoperative care:  - Pain management: routine  - DVT Prophylaxis: Lovenox . SCDs. Early ambulation.  -  UOP: adequate. Foley removed POD1 AM when patient is ambulating comfortably.  - FEN: Regular diet.    - PPHct pending  - Due to PPH, Postpartum Hemorrhage Risk is high; plan is for CBC    POP  - QBL 2.5L  - H/H 9.5/29 > 9.8/30 > 9.8/30  - Uterotonics: miso, methergine , TXA  - Pain: routine    gHTN (dx PP)  - Meds: Nifed 30 (start 7/24)  - POD1 HELLP labs pending  - NSSR x1 7/23 2146    Pyelonephritis   - Hx Klebsiella UTI  - Renal USN: no stone, mild bilateral hydronephrosis  - Abx: CTX x1 dose IV    Elevated 1h GTT, declined further testing  Hx abdominoplasty  Sickle cell trait  Migraines    Postpartum care:  - Rh status as above. Rhogam is not  indicated.    - Infant: girls x2   - Feeding: breast  - PPBC: nexplanon  - Patient has a diagnosis of gestational hypertension.  They will require a blood pressure device on discharge for home blood pressure monitoring.   - Immunizations: MMR ordered prior to discharge.    Disposition: Plan for D/C home POD # 3.    Ronita Sharps, MD  Obstetrics & Gynecology PGY-2  Pager [phone]

## 2024-07-07 NOTE — Progress Notes (Addendum)
 Writer reviewed chart for care coordination.  Patient delivered twin baby girls via C section on 07/06/2024.     Address: Home address and phone number confirmed and correct.   OB/GYN/PCP:Patient is seen by [facility] for [facility]N for care.  Assistance at home: Patient will have help at home from her husband  Oneil.   Breast Pump: Lactation Consultant following patient for support. Patient states that she had Medela pump that was obtained out of pocket and would like to obtain new Spectra  pump thru insurance. Writer has sent request to [facility] to have pump delivered to patient's room for discharge.  Contraception Plan: Per provider,  nexplanon  Pharmacy:Patient states that preferred pharmacy for discharge medications to be sent to is [facility]. Pharmacy will deliver discharge medications to patient's room prior to discharge.   Blood Pressure Cuff: Patient will need  blood pressure monitor for  home. Writer has contacted [facility] to order monitor to be delivered to patient;s room. Writer will provide patient with  Your Blood Pressure: Check-Know-Share paperwork to patient.  Nurse will check validation of hospital manual BP monitor and patient's monitor.   Blood pressure monitor has been assembled by [facility]/unit staff.  Patient was informed to take blood pressure monitor and readings to OB/GYN appointments.   Home Care:Spoke to patient regarding discharge planning and CHN referral per Medicaid guidelines. Patient has declined home care referral.   Transportation home to be provided by: Patient's husband will provide transportation home when patient is ready to discharge.  Car Seat: Patient has car seat available for baby's discharge.   MyChart Access:Patient has access to My Chart.     Patient has been informed to contact baby's/babies pediatrician's office to schedule first office visit.       The following appointments have been scheduled :    07/13/2024 @ 2:30 pm  [facility] with Ami Macario Earing NP for 1 week incisional and BP check.     08/17/2024 @ 1:30 pm [facility] with Ami Macario Earing NP for 6 week post partum visit.     [facility] will continue to follow and coordinate discharge planning needs.   Please contact me with any questions or concerns. Thank you!     Darice Chiles, RN BSN  Acute Care Coordinator [id number] High Risk OB and [address] High Risk Labor/Delivery  [phone]     Weekend coverage pager [phone]

## 2024-07-07 NOTE — Progress Notes (Addendum)
 Social Work Note -      07/07/24 1338   Demographics   Primary Contact Phone Number [phone]   Primary Residence Address [address] [place] [address]   Household Composition lives with 35yo daughter   Insurance Information Other (comment)  (BC/BS & Medicaid)   Service Location [facility]   [facility] OB Unit [id number]   OBGYN Provider [facility]   Risk Factors   Risk Factors Housing issues   Referrals & Resources Provided   Referrals & Resources Provided Other (comment)  (parking passes)   Time Spent   Time Spent with Patient (min) 20     Information:  Pt is a 33yo, G7P3 who delivered twin girls yesterday by c-section at [facility].  She is known to [facility] through early prenatal care visits at [name].  She was referred & completed phone intake with [facility] - but missed multiple prenatal care appts between 16-30w gestation, not responsive to [employer] worker contacts during that time.  I met with pt today to check in, follow up on stressor previously identified (housing issues) and assess for any discharge needs.      During our visit, pt shared missed appts/no response to outreach contacts were related to stress that she was feeling at the time.  She notes improved mood/less stress - was able to stabilize her housing in recent months.  She secured a home in [place] - is very happy with the arrangements.  She reports that she is prepared for the twins at home, breastfeeding, will use [facility] for [facility] care.  She identifies FOB and family as good supports, 35yo daughter excited to help her with the babies.      Only issue pt asked for assistance around is parking.  Her car is currently in the parking garage.  Discharge date for pt and baby not definitive at this time - one baby is currently receiving tx related to elevated bilirubin.    Plan:  1.Letter for 10 free parking passes provided, will re-evaluate potential need for additional passes tomorrow    Sanders Manninen, LMSW [phone]

## 2024-07-07 NOTE — Progress Notes (Signed)
 Writer called into room by patient for bleeding at 1830.  Patient with free flowing blood from vagina while sitting on edge of bed with breast pumping.  Patient attempted to get OOB to bathroom to clean up but bleeding was free flowing so patient got into bed.  Patient had large amount of bleeding with clots.  Patients EBL estimated around 700cc.  Dr. Myrna at bedside at [phone].  Ultrasound and exam done, patient received rectal miso at 1847 and IM hemobate at 1850.  CBC and fibrinogen  were drawn and sent stat.  Patient called writer back into room at [phone] for more bleeding, patient passed three small clots with some free flow bleeding that resolved with fundal massage totaling .  Providers made aware.  VSS per flow sheet.

## 2024-07-07 NOTE — Lactation Note (Addendum)
 This note was copied from a baby's chart.  Lactation Consultant Visit   Patient: Madison Rose            AGE: 35 days              Corrected GA: dorrine fent  MRN: [id number]     Maternal Information   Birth Parent Name:   Madison Rose   Type of Visit : Follow up visit    Parent Feeding Goals: Breastfeeding, Pump and bottle feed maternal/ human milk  Lactate for: As long as possible (From Delivery Summary)           Child's Current Feeding Type : Breastfeeding, Formula        Pump for Home: Has new pump, Medela (states she bought her new pump out of pocket, did not use insurance benefit)    Birth Parent Medications : Compatible with lactation               Right Breast Assessment: Soft, Pendulous  Left Breast Assessment: Soft, Pendulous    Right Nipple Assessment : Everted, Intact, Flat/ Minimally everted (nipple smooth and rounded with no obvious nipple pores, per parent, with previous breastfeeding experience, breasts did gets engorged but she was unable to express milk from right breast. she plans to try to pump/ breastfeed on left only)    Left Nipple Assessment : Everted, Intact          Parent/Newborn Medical Factors That May Impact Lactation: C-section Delivery, Epidural, Maternal age above 25, Excessive blood loss (Sickle Cell Anemia)    Newborn Assessment   Infant Growth:    Birth Weight: 3200 g (7 lb 0.9 oz)   Today's Wt: 3070 g (6 lb 12.3 oz)   Percent Weight Change: -4.07 %          Impression of Breast Feeding: Unable to assess at this visit      Interventions and Instructions   Met with parent at bedside in/on [facility] [address]   Goal of LC Visit : Routine check in    Met with parent in her room on 312, Baby A in room with parent , Baby B in NBN.  Encouraged frequent, cue based feedings and offered ongoing LC support.    Addendum 1445: parent requested assistance with latch Baby A, infant fully asleep, would not wake. Parent stated she would like to pump and offer Expressed milk, provided her with manual pump and she  expressed 10 ml in only a few minutes of hand pumping, encouraged her to offer this to baby now, attempt latch again later.     Reviewed: General Education Provided : Lactogensis/Supply & Demand, How to contact LC, Pumping frequency  Latch Specific Teaching : None done this visit    Please see Lactation flowsheet for further details.    Plan   Follow-up with lactation daily while dyad inpatient or by parental request.  Time Spent during Visit : 5-10 mins    Feeding Plan:   Recommended Feeding Plan: Ad lib breastfeeding, Strongly recommend hand expression of extra MBM after BF and offer all to baby, Support pumping efforts    Plan with Parent: Assist/Assess breastfeeding when baby active         Corean Rossetti, RN, [facility]   Lactation Consultant

## 2024-07-08 ENCOUNTER — Other Ambulatory Visit: Payer: Self-pay

## 2024-07-08 DIAGNOSIS — O34219 Maternal care for unspecified type scar from previous cesarean delivery: Secondary | ICD-10-CM | POA: Insufficient documentation

## 2024-07-08 DIAGNOSIS — O321XX1 Maternal care for breech presentation, fetus 1: Secondary | ICD-10-CM | POA: Insufficient documentation

## 2024-07-08 LAB — RED BLOOD CELLS
Component blood type: O POS
Dispense status: TRANSFUSED

## 2024-07-08 LAB — HEPATITIS B SURFACE ANTIGEN: HBV S Ag: NEGATIVE

## 2024-07-08 LAB — CBC
Hematocrit: 23 % — ABNORMAL LOW (ref 34–49)
Hematocrit: 24 % — ABNORMAL LOW (ref 34–49)
Hemoglobin: 7.5 g/dL — ABNORMAL LOW (ref 11.2–16.0)
Hemoglobin: 8.1 g/dL — ABNORMAL LOW (ref 11.2–16.0)
MCV: 92 fL (ref 75–100)
MCV: 93 fL (ref 75–100)
Platelets: 198 THOU/uL (ref 150–450)
Platelets: 205 THOU/uL (ref 150–450)
RBC: 2.5 MIL/uL — ABNORMAL LOW (ref 4.0–5.5)
RBC: 2.6 MIL/uL — ABNORMAL LOW (ref 4.0–5.5)
RDW: 13.7 % (ref 0.0–15.0)
RDW: 13.7 % (ref 0.0–15.0)
WBC: 9.6 THOU/uL (ref 3.5–11.0)
WBC: 9.4 THOU/uL (ref 3.5–11.0)

## 2024-07-08 LAB — HEPATITIS C VIRUS ANTIBODY WITH REFLEX TO HEPATITIS C QUANTITATIVE: Hep C Ab: NEGATIVE

## 2024-07-08 LAB — HIV-1/2  ANTIGEN/ANTIBODY SCREEN WITH CONFIRMATION: HIV 1&2 ANTIGEN/ANTIBODY: NONREACTIVE

## 2024-07-08 MED ORDER — SODIUM CHLORIDE 0.9 % IV SOLN WRAPPED *I*
3.0000 mL/h | Status: AC
Start: 2024-07-08 — End: 2024-07-09

## 2024-07-08 MED ORDER — NIFEDIPINE 60 MG PO TB24 *I*
60.0000 mg | ORAL_TABLET | Freq: Every day | ORAL | 0 refills | Status: AC
Start: 2024-07-09 — End: 2024-08-10
  Filled 2024-07-08: qty 30, 30d supply, fill #0

## 2024-07-08 MED ORDER — NIFEDIPINE CR OSMOTIC 30 MG PO TB24 *I*
30.0000 mg | ORAL_TABLET | Freq: Once | ORAL | Status: AC
Start: 2024-07-08 — End: 2024-07-08
  Administered 2024-07-08: 30 mg via ORAL
  Filled 2024-07-08: qty 1

## 2024-07-08 MED ORDER — NIFEDIPINE CR OSMOTIC 30 MG PO TB24 *I*
60.0000 mg | ORAL_TABLET | Freq: Every day | ORAL | Status: DC
Start: 2024-07-09 — End: 2024-07-11
  Administered 2024-07-09 – 2024-07-11 (×3): 60 mg via ORAL
  Filled 2024-07-08 (×3): qty 2

## 2024-07-08 NOTE — Lactation Note (Signed)
 This note was copied from a baby's chart.  Lactation Consultant Visit   Patient: Madison Rose            AGE: 35 days              Corrected GA: dorrine fent  MRN: [id number]     Maternal Information   Birth Parent Name:   Madison Rose   Type of Visit : Follow up visit    Parent Feeding Goals: Breastfeeding, Pump and bottle feed maternal/ human milk  Lactate for: As long as possible (From Delivery Summary)           Child's Current Feeding Type : Breastfeeding, Formula        Pump for Home: Has new pump, Medela (states she bought her new pump out of pocket, did not use insurance benefit)    Birth Parent Medications : Compatible with lactation    Right Breast Assessment: Soft, Pendulous, Palpable ducts  Left Breast Assessment: Soft, Pendulous    Right Nipple Assessment : Flat/ Minimally everted, Intact (smooth and rounded, parent reports she has never been able to express milk from this breast and believes she has no nipple pores)  Left Nipple Assessment : Everted, Intact          Parent/Newborn Medical Factors That May Impact Lactation: C-section Delivery, Epidural, Maternal age above 51, Excessive blood loss (Sickle Cell Anemia)    Newborn Assessment   Infant Growth:    Birth Weight: 3200 g (7 lb 0.9 oz)   Today's Wt: 3070 g (6 lb 12.3 oz)   Percent Weight Change: -4.07 %      Oral Assessment:   Palate: Gently Sloped, WNL     Coryllos Type  Coryllos Type: Type 3  Function Items  Lateralization: 2 - Complete lateralization  Lift of tongue: 1 - Only edges to mid-mouth or lift with fingers rises to 1/2 of the mouth  Extension of tongue: 2 - tip past lower lip  Function Score (<4=Ankyloglossia): 5  Snapback present?:  (unable to assess fully, baby not sucking, sleepy)       Feedings:   Last Charted feeding  Breast Milk Feeding  Route: Bottle  Fed by: RN/LC  Expressed Breast Milk Volume: 5 ml  BF Attempts (Ineffective at Breast): 1  BF Occurrence (Effective at Breast): 1  BF Effectiveness: Independent, per mother effective  breastfeeding, audible/visible swallow  Minutes of Active Sucking: 15 min  Pumping: Ineffective breastfeeding (manual pump)             LATCH Documentation  Latch: Grasps breast, tongue down, lips flanged, rhythmic sucking  Audible Swallowing: Spontaneous and intermittent (24 hours old)  Type of Nipple: Everted (after stimulation)  Comfort (Breast/Nipple): Soft/non-tender  Hold (Positioning): No assist from staff, mother able to position/hold infant  LATCH Score: 10      Impression of Breast Feeding: Unable to assess at this visit      Interventions and Instructions   Met with parent at bedside in/on [facility] [address]   Goal of LC Visit : Routine check in    Parent reports she is both breast and bottle feeding. She states baby B Madison Rose is latching comfortably and that she is also supplementing [place] with formula. Baby A Madison Rose per mother is having difficulty latching, and she is also supplementing with bottles ( Expressed breast milk and formula) . Encouraged parent to call [facility] for latch help.      Parent states she has been pumping  as much as I can  Encouraged her to put babies to breast first, then pump after. Encouraged stimulation to both breasts,  she states she is currently only able to express milk from left breast.       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  Parent has requested telemedicine appointment, [facility] notified clinic to assist with scheduling this, parent states she has viewed new appointment in MyChart.      Reviewed: General Education Provided : Lactogensis/Supply & Demand, How to contact LC, Pumping frequency  Latch Specific Teaching : None done this visit    Please see Lactation flowsheet for further details.    Plan   Follow-up with lactation daily while dyad inpatient or by parental request.  Time Spent during Visit : 5-10 mins    Feeding Plan:   Recommended Feeding Plan: Ad lib breastfeeding, Strongly recommend hand expression  of extra MBM after BF and offer all to baby, Support pumping efforts    Plan with Parent: Assist/Assess breastfeeding when baby active         Corean Rossetti, RN, [name]   Lactation Consultant

## 2024-07-08 NOTE — Progress Notes (Signed)
 OBSTETRICS CESAREAN DELIVERY POST-OP PROGRESS NOTE   Postpartum Day: 2    Subjective     Shanyce is doing well. Bleeding has been very minimal since uterotonics last night. Intermittent LH/dizziness when standing up. Tolerating regular diet. Has not passed flatus. Voiding spontaneously. Has ambulated. Denies chest pain, SOB.         Objective     Vitals:    07/07/24 2002 07/07/24 2348 07/08/24 0040 07/08/24 0357   BP: 106/68 134/84  (!) 131/91   BP Location:       Pulse: 101 99  101   Resp: 18 16 20 20    Temp: 35.7 C (96.3 F) 36.3 C (97.3 F)  37 C (98.6 F)   TempSrc: Temporal Temporal  Temporal   SpO2: 98% 97%  100%         Mental Status: alert and oriented x 3  Cardiovascular: regular rate, warm and well perfused  Respiratory: normal work of breathing, on RA  Abdomen: soft, appropriately tender, non-distended  Incisions: cdi with dressing removed  Fundus: Fundus firm at [employer]     Recent Labs   Lab 07/07/24  1846 07/07/24  0405 07/06/24  2254   WBC 8.7 10.0 11.4*   Hemoglobin 9.0* 9.8* 9.8*   Hematocrit 27* 30* 30*   Platelets 210 199 196     Recent Labs   Lab 07/07/24  0405 07/05/24  2251   Sodium 132* 136   Potassium 3.9 3.9   Chloride 100 104   CO2 20 21   UN <4* 6   Creatinine 0.58 0.72   Glucose 70 95   Calcium  8.1* 8.5*   Total Protein 5.9* 6.7   Albumin 2.9* 3.1*   ALT 8 8   AST 30 19   Alk Phos 118* 132*   Bilirubin,Total 0.5 0.5         Rubella IgG AB (no units)   Date Value   02/29/2024 0.8       ABO RH Blood Type (no units)   Date Value   07/06/2024 O RH POS           Information for the patient's newborn:  Ettie, Krontz [[id number]]     Newborn ABO RH   Date Value Ref Range Status   07/06/2024 A RH POS  Final      Information for the patient's newborn:  [place], Vierling [[id number]]     Newborn ABO RH   Date Value Ref Range Status   07/06/2024 A RH POS  Final       Assessment & Plan     Arial is a 33 y.o. H2E7946 on POD# 2 s/p repeat LTCS at [redacted]w[redacted]d for BPP 2/10 for Twin B complicated by  PPH 2.6L. Overnight had delayed PPH of additional ~1L likely in part due to significant clot within the uterus. She received misoprostol  and hemabate . Since then the bleeding has been very minimal and uterine tone has been excellent. H/H collected at the time of the delayed hemorrhage was stable from prior. Repeat to be collected this AM. Otherwise routine post-op care and overall doing very well this morning.    Postoperative care+ delayed PPH  - QBL 2.5L + ~ 700 mL on POD1  - H/H 9.5/29 > 9.8/30 > 9.8/30 > (POD1 pm) 9/27  - POD1 fibrinogen  333  - Intra-op uterotonics: miso, methergine , TXA  - POD1 uterotonics: miso, hemabate   - Pain: routine  -  DVT Prophylaxis: Lovenox . SCDs. Early ambulation.  - FEN: Regular diet.      gHTN (dx PP)  - Meds: Nifed 30 (start 7/24)  - POD1 HELLP labs normal  - NSSR x1 7/23 2146  - Blood pressures overnight normal with few low mild range    Pyelonephritis - Abx: CTX x1 dose IV  Elevated 1h GTT, declined further testing  Hx abdominoplasty  Sickle cell trait  Migraines    Postpartum care:  - Rh status as above. Rhogam is not indicated.    - Infant: girls x2   - Feeding: breast  - PPBC: nexplanon  - Patient has a diagnosis of gestational hypertension.  They will require a blood pressure device on discharge for home blood pressure monitoring.   - Immunizations: MMR ordered prior to discharge.    Disposition: Plan for D/C home POD # 3.    Damien Novak MD   Obstetrics & Gynecology PGY-4

## 2024-07-09 NOTE — Progress Notes (Signed)
 OBSTETRICS CESAREAN DELIVERY POST-OP PROGRESS NOTE   Postpartum Day: 3    Subjective     Madison Rose is doing well. Bleeding has been normal per her report. She denies dizziness or lightheadedness at this time. Tolerating regular diet without nausea or vomiting. Flatus present. Voiding spontaneously.Has ambulated. Denies chest pain, SOB. Baby Madison Rose at bedside, is interested in discharge today if [place] can be cleared.     Objective     Vitals:    07/09/24 0036 07/09/24 0130 07/09/24 0423 07/09/24 0515   BP: (!) 131/97  111/72    Pulse: 108  108    Resp: 18 18 18 16    Temp: 36.7 C (98.1 F)  36.7 C (98.1 F)    TempSrc: Temporal  Temporal    SpO2: 97%  98%      Mental Status: alert and oriented x 3  Cardiovascular: regular rate and rhythm, warm and well perfused  Respiratory: normal work of breathing, on RA, CTAB  Abdomen: soft, appropriately tender, non-distended  Incisions: cdi with dressing removed  Fundus: Fundus firm at [employer]     Recent Labs   Lab 07/08/24  2013 07/08/24  0554 07/07/24  1846   WBC 9.4 9.6 8.7   Hemoglobin 8.1* 7.5* 9.0*   Hematocrit 24* 23* 27*   Platelets 205 198 210     Recent Labs   Lab 07/07/24  0405 07/05/24  2251   Sodium 132* 136   Potassium 3.9 3.9   Chloride 100 104   CO2 20 21   UN <4* 6   Creatinine 0.58 0.72   Glucose 70 95   Calcium  8.1* 8.5*   Total Protein 5.9* 6.7   Albumin 2.9* 3.1*   ALT 8 8   AST 30 19   Alk Phos 118* 132*   Bilirubin,Total 0.5 0.5         Rubella IgG AB (no units)   Date Value   02/29/2024 0.8       ABO RH Blood Type (no units)   Date Value   07/06/2024 O RH POS           Information for the patient's newborn:  Madison, Rose [[id number]]     Newborn ABO RH   Date Value Ref Range Status   07/06/2024 A RH POS  Final      Information for the patient's newborn:  Madison, Rose [[id number]]     Newborn ABO RH   Date Value Ref Range Status   07/06/2024 A RH POS  Final       Assessment & Plan     Madison Rose is a 33 y.o. H2E7946 on POD# 3 s/p repeat LTCS at  [redacted]w[redacted]d for BPP 2/10 for Twin B complicated by PPH 2.6L. Overnight had delayed PPH of additional ~1L likely in part due to significant clot within the uterus. She received misoprostol  and hemabate . Since then the bleeding has been very minimal and uterine tone has been excellent. Is s/p 1U pRBC with rise likely appropriate and reflective of equilibration. Is asymptomatic from anemia standpoint and agreeable to PO iron  on dischargeOtherwise routine post-op care and overall doing very well this morning.    Postoperative care+ delayed PPH  - QBL 2.5L + ~ 700 mL on POD1  - H/H 9.5/29 > 9.8/30 > 9.8/30 (POD1 pm) 9/27 > 7.5/23 > 1 unit pRBCs > 8.1/24   - POD1 fibrinogen  333  - Intra-op uterotonics: miso, methergine , TXA  -  POD1 uterotonics: miso, hemabate   - Pain: routine  - DVT Prophylaxis: Lovenox . SCDs. Early ambulation.  - FEN: Regular diet.      gHTN (dx PP)  - Meds: Nifed 30 (start 7/24)  - POD1 HELLP labs normal  - NSSR x1 7/23 2146  - Blood pressures overnight normal with few low mild range    Pyelonephritis - Abx: CTX x1 dose IV  Elevated 1h GTT, declined further testing  Hx abdominoplasty  Sickle cell trait  Migraines    Postpartum care:  - Rh status as above. Rhogam is not indicated.    - Infant: girls x2   - Feeding: breast  - PPBC: nexplanon  - Patient has a diagnosis of gestational hypertension.  They will require a blood pressure device on discharge for home blood pressure monitoring.   - Immunizations: MMR ordered prior to discharge.    Disposition: Plan for D/C home POD # 3.    Madison Champagne, MD  [facility] OBGYN Resident, PGY-4  [phone]

## 2024-07-09 NOTE — Plan of Care (Signed)
 Problem: Obstetric Hemorrhage  Goal: Maximize hemodynamic stability  Outcome: Maintaining     Problem: Maternal Well-Being  Goal: Patient and Family Centered Care  Outcome: Maintaining  Goal: Optimize Maternal Outcomes  Outcome: Maintaining  Goal: Maximize Maternal Comfort and Decrease Maternal Stress  Outcome: Maintaining  Goal: Reduce the risk of infection  Outcome: Maintaining     Problem: Obstetric Recovery & Postpartum s/p Delivery  Goal: Breasts are soft with nipple integrity intact  Outcome: Maintaining  Goal: Fundus firm at midline  Outcome: Maintaining  Goal: Moderate rubra freeflow, no purulent discharge, no foul smelling lochia  Outcome: Maintaining  Goal: Appropriate behavior observed  Outcome: Maintaining  Goal: Ambulates independently  Outcome: Maintaining  Goal: Achieve normal elimination following delivery  Outcome: Maintaining     Problem: Maternal Cardiovascular/Circulation in Pregnancy  Goal: Maximizing maternal circulation and minimizing Deep Vein Thrombosis (DVT)  Outcome: Maintaining     Problem: Cesearean Delivery  Goal: Operative site will remain intact and free from infection  Outcome: Maintaining     Problem: Pain  Goal: Patient's pain/discomfort is manageable  Outcome: Progressing towards goal  Goal: Report decrease in pain level  Description: Alleviation of pain or a reduction in pain to a level of comfort that is acceptable to the patient.  Outcome: Progressing towards goal     Problem: Knowledge Deficit  Goal: Patient/S.O. demonstrates understanding of disease process, treatment plan, medications, and discharge instructions.  Outcome: Maintaining     Problem: Lactation utilized for newborn feeding  Goal: Demonstrates appropriate breast feeding techniques  Outcome: Maintaining

## 2024-07-09 NOTE — Lactation Note (Signed)
 This note was copied from a baby's chart.  Lactation Consultant Visit   Patient: Madison Rose            AGE: 35 days              Corrected GA: 38w 1d  MRN: [id number]     Maternal Information   Birth Parent Name:   Bennette Hasty   Type of Visit : Daily visit    Parent Feeding Goals: Breastfeeding, Pump and bottle feed maternal/ human milk  Lactate for: As long as possible (From Delivery Summary)     Child's Current Feeding Type : Breastfeeding, Formula, Expressed parent milk        Pump for Home: Has new pump, Medela (states she bought her new pump out of pocket, did not use insurance benefit)    Birth Parent Medications : Compatible with lactation      Right Breast Assessment: Unable to assess, No parental concerns  Left Breast Assessment: Unable to assess, No parental concerns    Right Nipple Assessment : UTA, No parental concerns  Left Nipple Assessment : UTA, No parental concerns  Right Side Nipple Pain: 0  Left Side Nipple Pain: 0    Parent/Newborn Medical Factors That May Impact Lactation: C-section Delivery, Epidural, Maternal age above 67, Excessive blood loss (Sickle Cell Anemia)    Newborn Assessment   Infant Growth:    Birth Weight: 3200 g (7 lb 0.9 oz)   Today's Wt: 3050 g (6 lb 11.6 oz)   Percent Weight Change: -4.7 %      Oral Assessment:   Palate: Gently Sloped, WNL     Coryllos Type  Coryllos Type: Type 3  Function Items  Lateralization: 2 - Complete lateralization  Lift of tongue: 1 - Only edges to mid-mouth or lift with fingers rises to 1/2 of the mouth  Extension of tongue: 2 - tip past lower lip  Function Score (<4=Ankyloglossia): 5  Snapback present?:  (unable to assess fully, baby not sucking, sleepy)       Feedings:   Last Charted feeding  Breast Milk Feeding  Route: Bottle  Fed by: RN/LC  Expressed Breast Milk Volume: 5 ml  BF Attempts (Ineffective at Breast): 1  BF Occurrence (Effective at Breast): 1  BF Effectiveness: Independent, per mother effective breastfeeding, audible/visible  swallow  Minutes of Active Sucking: 15 min  Pumping: Ineffective breastfeeding (manual pump)             LATCH Documentation  Latch: Grasps breast, tongue down, lips flanged, rhythmic sucking  Audible Swallowing: Spontaneous and intermittent (24 hours old)  Type of Nipple: Everted (after stimulation)  Comfort (Breast/Nipple): Soft/non-tender  Hold (Positioning): No assist from staff, mother able to position/hold infant  LATCH Score: 10      Impression of Breast Feeding: Unable to assess at this visit      Interventions and Instructions   Met with parent at bedside in/on [facility] [address]   Goal of LC Visit : Routine check in    Parent reports she has done limited pumping today when baby are intermittently taking bottles of formula. Reinforced pumping any time baby is getting bottle, has all supplies in room. Encouraged her to call tomorrow to have lactation present for breastfeeding each twin.     Parent has no questions or concerns at this time.     Reviewed: General Education Provided : Benefits of skin to skin, Breast/Nipple care, Feeding expectations for corrected gestational age/medical needs, Frequency  of breast stimulation, Lactogensis/Supply & Demand, How to contact [facility]  Latch Specific Teaching : None done this visit    Please see Lactation flowsheet for further details.    Plan   Follow-up with lactation daily while dyad inpatient or by parental request.  Time Spent during Visit : 10-20 mins    Feeding Plan:   Recommended Feeding Plan: Ad lib breastfeeding, Formula, Breastfeeding and bottle feeding with MBM    Plan with Parent: Assist/Assess breastfeeding when baby active         Randine MARLA Carnes, RN, [facility]   Lactation Consultant

## 2024-07-10 NOTE — Progress Notes (Addendum)
 OBSTETRICS CESAREAN DELIVERY POST-OP PROGRESS NOTE   Postpartum Day: 4    Subjective     Madison Rose is doing well this morning.  Pain is well-controlled with current regimen.  Tolerating regular diet without nausea/vomiting.  Flatus present. She  has ambulated.  Denies chest pain, SOB, or lightheadedness.  Appropriate vaginal bleeding.      Objective     Vitals:    07/09/24 2235 07/10/24 0006 07/10/24 0338 07/10/24 0532   BP: 125/82 135/85  126/86   BP Location:  Right arm     Pulse: 109 107  106   Resp: 20 16 14 16    Temp: 37.1 C (98.8 F) 36.8 C (98.2 F)  36.6 C (97.9 F)   TempSrc: Temporal Temporal  Temporal   SpO2: 97% 97%  97%           Mental Status: Alert and oriented x 3  Cardiovascular: Regular rate   Respiratory: Nonlabored breathing on room air   Abdomen: Soft, appropriately tender, nondistended, +BS  Incision: clean, dry, and intact  Fundus: Fundus firm at umbilicus -1  Neurological: Grossly intact  Extremities/Skin: Minimal edema of pedal and pretibial (1+)      Labs     Recent Labs   Lab 07/08/24  2013 07/08/24  0554 07/07/24  1846   Hematocrit 24* 23* 27*       Rubella IgG AB (no units)   Date Value   02/29/2024 0.8       ABO RH Blood Type (no units)   Date Value   07/06/2024 O RH POS           Information for the patient's newborn:  Madison Rose, Madison Rose [[id number]]     Newborn ABO RH   Date Value Ref Range Status   07/06/2024 A RH POS  Final      Information for the patient's newborn:  Madison Rose, Madison Rose [[id number]]     Newborn ABO RH   Date Value Ref Range Status   07/06/2024 A RH POS  Final       Assessment & Plan     Madison Rose is a 33 y.o. [id number] on POD# 4 s/p repeat      Madison Rose, Madison Rose [[id number]]   C-Sec, Low Madison Rose, Madison Rose [[id number]]   C-Sec, Low Transverse at [redacted]w[redacted]d c/b PPH of 3.8 L. Doing well.    Routine postoperative care:  - Pain management: Routine   - DVT Prophylaxis: SCDs. Early ambulation.  - UOP: voiding spontaneously   - FEN: Regular diet.    - PPHct as above. Ferrous sulfate  is  indicated on discharge.    POP + delayed PPH  - QBL 2.5L + ~ 700 mL on POD1  - H/H 9.5/29 > 9.8/30 > 9.8/30 > (POD1 pm) 9/27 > 7.5/23 > 1 unit pRBCs > 8.1/24  - POD1 fibrinogen  333  - Intra-op uterotonics: miso, methergine , TXA  - POD1 uterotonics: miso, hemabate   - Pain: routine    gHTN (dx PP)  - Meds: Nifed 60 (increased 7/25)  - POD1 HELLP labs wnl  - NSSR x1 7/23 2146    Pyelonephritis   - Hx Klebsiella UTI  - Renal USN: no stone, mild bilateral hydronephrosis  - Abx: CTX x1 dose IV  - Ucx neg    Dichorionic Diamniotic Twin Pregnancy   Elevated 1h GTT, declined further testing  Hx abdominoplasty  Sickle cell trait  Migraines  Hx CS x1  Postpartum care:  - Rh status as above. Rhogam is not indicated.    - Infant: girls x2   - Feeding: breast  - PPBC: nexplanon  - Patient has a diagnosis of gestational hypertension.  They will require a blood pressure device on discharge for home blood pressure monitoring.   - Immunizations: MMR ordered prior to discharge.    Disposition: Plan for D/C home POD # 4.    [name] Nurse, MD   Obstetrics & Gynecology PGY-4  Pager [phone]

## 2024-07-10 NOTE — Lactation Note (Signed)
 This note was copied from a baby's chart.  Lactation Consultant Visit   Patient: Madison Rose            AGE: 35 days              Corrected GA: 38w 2d  MRN: [id number]     Maternal Information   Birth Parent Name:   Madison Rose   Type of Visit : Daily visit    Parent Feeding Goals: Breastfeeding, Pump and bottle feed maternal/ human milk  Lactate for: As long as possible (From Delivery Summary)     Child's Current Feeding Type : Breastfeeding, Formula, Expressed parent milk        Pump for Home: Has new pump, Medela (states she bought her new pump out of pocket, did not use insurance benefit)    Birth Parent Medications : Compatible with lactation    Right Breast Assessment: Filling/Full, Firm, Palpable ducts, Engorged, Pendulous  Left Breast Assessment: Filling/Full, Palpable ducts, Engorged, Pendulous    Right Nipple Assessment : Intact  Left Nipple Assessment : Everted, Intact  Right Side Nipple Pain: 0  Left Side Nipple Pain: 0    Parent/Newborn Medical Factors That May Impact Lactation: C-section Delivery, Epidural, Maternal age above 75, Excessive blood loss (Sickle Cell Anemia)    Newborn Assessment   Infant Growth:    Birth Weight: 3200 g (7 lb 0.9 oz)   Today's Wt: 3190 g (7 lb 0.5 oz)   Percent Weight Change: -0.32 %      Oral Assessment:   Palate: Gently Sloped, WNL     Coryllos Type  Coryllos Type: Type 3  Function Items  Lateralization: 2 - Complete lateralization  Lift of tongue: 1 - Only edges to mid-mouth or lift with fingers rises to 1/2 of the mouth  Extension of tongue: 2 - tip past lower lip  Function Score (<4=Ankyloglossia): 5  Snapback present?:  (unable to assess fully, baby not sucking, sleepy)       Feedings:   Last Charted feeding  Breast Milk Feeding  Route: Bottle  Fed by: Parent(s)  Expressed Breast Milk Volume: 35 ml  BF Attempts (Ineffective at Breast): 1  BF Occurrence (Effective at Breast): 1  BF Effectiveness: Independent, per mother effective breastfeeding, audible/visible  swallow  Minutes of Active Sucking: 15 min  Pumping: Ineffective breastfeeding (manual pump)             LATCH Documentation  Latch: Grasps breast, tongue down, lips flanged, rhythmic sucking  Audible Swallowing: Spontaneous and intermittent (24 hours old)  Type of Nipple: Everted (after stimulation)  Comfort (Breast/Nipple): Soft/non-tender  Hold (Positioning): No assist from staff, mother able to position/hold infant  LATCH Score: 10      Impression of Breast Feeding: Unable to assess at this visit      Interventions and Instructions   Met with parent at bedside in/on [facility] [address]   Goal of LC Visit : Routine check in, Breast or nipple assessment  Brief visit to offer lactation support. Parent states she is latching and offering bottles,  she states her breasts have become sore and engorged. Right breast is full and firm,  left breast also engorged with palpable areas of thickening, but less firm than right. Parent states though milk is pumping easily from left breast, only small amounts of milk are coming from right, provided her with ice packs and encouraged application of cold compresses or ice for 10 min at a time, and gentle  reverse massage,and pumping any time babies receive a bottle.     Encouraged parent to call for latch help at next feeding    Reviewed: General Education Provided : Engorgement management, Frequency of breast stimulation, Lactogensis/Supply & Demand, How to contact LC, Therapeutic breast massage  Latch Specific Teaching : None done this visit    Please see Lactation flowsheet for further details.    Plan   Follow-up with lactation daily while dyad inpatient or by parental request.  Time Spent during Visit : 5-10 mins    Feeding Plan:   Recommended Feeding Plan: Parents intend to feed a mix of breast milk and formula    Plan with Parent: Return as needed, Assist/Assess breastfeeding when baby active      Corean Rossetti, RN, [facility]   Lactation Consultant

## 2024-07-11 ENCOUNTER — Other Ambulatory Visit: Payer: Self-pay

## 2024-07-11 ENCOUNTER — Telehealth: Payer: Self-pay

## 2024-07-11 DIAGNOSIS — N6459 Other signs and symptoms in breast: Secondary | ICD-10-CM

## 2024-07-11 MED ORDER — BLOOD PRESSURE MONITOR/ARM DEVI *A*
0 refills | Status: DC
  Filled 2024-07-11: qty 1, fill #0

## 2024-07-11 NOTE — Progress Notes (Signed)
 Pt aware of and agreeable to plan for discharge.  No PIV access at time of discharge.  Prescriptions complete.  Discharge instructions reviewed with pt and pt verbalized understanding. Pt is just waiting for BP monitor to be delivered to room and family member arrive.    Lela H,RN

## 2024-07-11 NOTE — Progress Notes (Signed)
 OBSTETRICS CESAREAN DELIVERY POST-OP PROGRESS NOTE   Postpartum Day: 5    Subjective     Madison Rose is doing okay.  Pain is well-controlled with current regimen.  Tolerating regular diet without nausea/vomiting.  Flatus present. She  has ambulated.  Denies chest pain, SOB, or lightheadedness.  Appropriate vaginal bleeding.  Babies are doing well.    Patient reported that she is having lactating from her right breast. Upon examination of her nipple, there is no discernable milk duct orifices that would allow milk to come out. Her right breast is engorged and painful.       Objective     Vitals:    07/10/24 1551 07/10/24 1939 07/10/24 2324 07/11/24 0455   BP: 132/90 134/82 104/74 (!) 142/88   BP Location:  Right arm Right arm    Pulse: 101 103 105 102   Resp: 20 16 14 16    Temp: 36.9 C (98.4 F) 37.2 C (99 F) 36.7 C (98.1 F) 36.5 C (97.7 F)   TempSrc: Temporal Temporal Temporal Temporal   SpO2: 96% 97% 97%            Mental Status: Alert and oriented x 3  Cardiovascular: Regular rate and rhythm with no murmurs  Respiratory: Clear to auscultate  Abdomen: Soft, appropriately tender, nondistended, +BS  Incision: clean, dry, and intact  Fundus: Fundus firm at umbilicus -1  Neurological: Grossly intact  Extremities/Skin: Minimal edema of pedal and pretibial (1+)      Labs     Recent Labs   Lab 07/08/24  2013 07/08/24  0554 07/07/24  1846   Hematocrit 24* 23* 27*       Rubella IgG AB (no units)   Date Value   02/29/2024 0.8       ABO RH Blood Type (no units)   Date Value   07/06/2024 O RH POS           Information for the patient's newborn:  Lauralee, Rose [[id number]]     Newborn ABO RH   Date Value Ref Range Status   07/06/2024 A RH POS  Final      Information for the patient's newborn:  Madison, Rose [[id number]]     Newborn ABO RH   Date Value Ref Range Status   07/06/2024 A RH POS  Final       Assessment & Plan     Madison Rose is a 33 y.o. [id number] on POD# 5 s/p repeat      Madison, Rose [[id number]]   C-Sec, Low Transverse       Madison, Rose [[id number]]   C-Sec, Low Transverse at [redacted]w[redacted]d.  Patient needs formal lactation consult to troubleshoot breast engorgement with no discernable milk duct orifices.      Routine postoperative care:  - Pain management: Routine  - DVT Prophylaxis: SCDs. Early ambulation.  - UOP: adequate voiding spontaneously  - FEN: Regular diet.    - PPHct as above. Ferrous sulfate  is indicated on discharge.    POP + delayed PPH  - QBL 2.5L + ~ 700 mL on POD1  - H/H 9.5/29 > 9.8/30 > 9.8/30 > (POD1 pm) 9/27 > 7.5/23 > 1 unit pRBCs > 8.1/24  - POD1 fibrinogen  333  - Intra-op uterotonics: miso, methergine , TXA  - POD1 uterotonics: miso, hemabate   - Pain: routine    gHTN (dx PP)  - Meds: Nifed 60 (increased 7/25)  - POD1 HELLP labs wnl  - NSSR x1 7/23 2146  Pyelonephritis   - Hx Klebsiella UTI  - Renal USN: no stone, mild bilateral hydronephrosis  - Abx: CTX x1 dose IV  - Ucx neg    Dichorionic Diamniotic Twin Pregnancy   Elevated 1h GTT, declined further testing  Hx abdominoplasty  Sickle cell trait  Migraines  Hx CS x1    Postpartum care:  - Rh status as above. Rhogam is not indicated.    - Infant: girls x2   - Feeding: breast  - PPBC: nexplanon  - Patient has a diagnosis of gestational hypertension.  They will require a blood pressure device on discharge for home blood pressure monitoring.   - Immunizations: MMR ordered prior to discharge.    Disposition: Plan for D/C home POD # 3.    Rose Nurse, MD   Obstetrics & Gynecology PGY-4  Pager [phone]

## 2024-07-11 NOTE — Lactation Note (Signed)
 This note was copied from a baby's chart.  Department of Lactation and Breastfeeding Medicine  Congratulations on the birth of your baby and your decision to provide the benefits of breast milk.    Baby A  Birth Weight: 3200 g (7 lb 0.9 oz)   Discharge: Weight: 3160 g (6 lb 15.5 oz)    Weight Change: -1% from BW  Your infant is on the 31 percentile growth curve     Baby B   Birth Weight: 2930 g (6 lb 7.4 oz)   Discharge: Weight: 2880 g (6 lb 5.6 oz)    Weight Change: -2% from BW  Your infant is on the 14 percentile growth curve     Feeding:  Your baby is learning to breastfeed. We know that breast milk alone for the first 6 months of life is best for you and your baby. Feeding frequency is important so continue to breastfeed every 1-3 hours and aim for 8-12 feedings every 24 hours. Babies are hungry about every 1-3 hours all day, evening, and night for the first 2 weeks or so! This will end. Feeding often during the first few weeks will help your milk supply to grow and then your baby will begin to take larger meals.  Keeping the baby deeply latched will help to increase the amount of milk your baby is able to get from your breasts and also prevent sore nipples. If your baby is sleepy or not vigorous while breastfeeding, you should hand express or pump and feed your baby the colostrum or milk. If you are worried about your baby's growth, ask your baby's doctor right away!    Remember to stimulate your breasts at least 8 times per day with either breastfeeding or pumping. Once the baby latches, nurse until baby shows signs of satisfaction. The amount of time may vary. 10-20 minutes of consistent suckle is considered normal. You want to see a rhythmic pattern: frequent swallowing (swallow after every 1-3 sucks), pause of less than 10 seconds, no sounds other than swallowing, jaw movement with wiggle of ear or temple area and no dimpling in of cheeks.  If baby does not nurse at least a total of 10 minutes, try to wake  your baby by burping, talking, gently rubbing the cheeks or back and then latch again or try switching from one breast to the other. The goal is to keep baby vigorous during feeding.    Feeding Cues:  Hand to mouth movements, stretching movements, making sounds, lip smacking, licking, and rapid eye movement under closed lids. Try to start breastfeeding when the baby is calm. Crying is a late cue and the baby may need to be calmed before they will latch.     Positioning:  Deep latch is important! Make sure you get as much breast tissue into the baby's mouth as possible. Start by having your nipple near the baby's nose.  Wait for their mouth to open and then guide them up and over your nipple, into your breast.  Ideally baby will have 4 points of contact with your breast: both cheeks, chin, and nose all touching you. Look for baby's ear shoulder and hip all to be in a straight line. Do not touch the back of the head. Pressure at the back of the head can make the baby will want to push their head against your hand rather than into your breast or even push the baby's chin down to their chest making it difficult to swallow.  Tips to [name]:  Unwrap the baby and hold skin to skin, change diaper, rock baby foward & back, stroke baby's back, stroke baby's lips with your fingers or baby's fingers. If baby does not wake up within 20 minutes, try again in 1 hour or as soon as the baby begins to wake.     How to know baby is getting enough to eat: Baby is feeding whenever they want and waking to eat at least 8-12 times every 24 hours, you hear and see swallowing during breastfeeding, and baby is content and relaxed after feeding. When the baby is 18 days old they should have at least 4 poops (color is light green to yellow) and 4-6 wet diapers every day. The best way to know your baby is well nourished is when your when baby is weighed and measured at the Pediatrician's, your doctor tells you the baby is growing as  expected.     If your baby is receiving any supplemental feeding from cup, spoon or bottle, we suggest mother should have additional breast stimulation with hand expression or pumping.  When baby receives food that does not come directly from your breast, pumping or hand expression tells your body that baby needs more milk and helps your body make what the baby needs.       Latch basics:  Continue to work on Arts development officer as demonstrated today.  Support baby's neck/ back  Turn tummy to tummy  Compress breast from underneath in shape of a U  Line up nipple to nose  Stroke nipple to top of lip/ nose and wait for a wide gape  Bring baby in close quickly before the mouth closes  Have cheeks/ nose close and touching the breast during the feed.  Use a rolled up blanket under your hand for support        Positions for Breastfeeding:    Cradle -when the baby's head is in the crook of the parent elbow and the opposite hand supports the breast.  This is a tough position initially because babies are small and it is difficult for some parents to control a small baby, getting them close enough to the breast.  As breastfeeding skill improves it is easier to transition to this at home.      [employer]- When a baby is supported by the arm and hand from one side to latch them onto the opposite breast.   (Example: left hand- attach baby to right breast) with baby lying across the parent abdomen. This is often a great method to feed a floppy newborn who needs to be pointed in the right direction.      Football- Holding the baby on your side with the arm of the breast you are using is the football hold.  With this method- the baby starts the latch 'under' the breast with the nipple on the nose, and when they open their mouth the parent rolls the breast down and the baby up and over the nipple- nose diving into the breast. This is often a great hold for any sized baby with the parent sitting rather straight in the bed and  having a pillow behind them, and one next to them at breast level for the baby.            Engorgement Management:   Your breasts start a transition to increasing milk production about 2 to 5 days after your baby is born. As your milk supply is  being established, it is normal for you to feel mild heaviness, warmth, and swelling in your breasts.    If breastfeeding or pumping doesn't empty your breasts, your breasts can become engorged. Engorgement is when your breasts are tender, swollen, firm, warm, and/or lumpy to touch.   If you are experiencing symptoms of engorgement:   Soften your breasts by using warmth and gentle massage prior to pumping or breastfeeding.   Continue to remove the milk by routinely feeding or pumping a minimum of 8 times a day.   After the first few days, if you are still experiencing fullness and hard, uncomfortable breasts change to cold compress or ice to reduce swelling. Apply cold compresses (ice packs over a layer of cloth) between feedings; 10 minutes on, 10 minutes off; repeat as needed. Stop using the ice when you are able to remove the milk from your breasts and your breasts soften after feeding or pumping.   You may take Ibuprofen  unless you have been told not to by your doctor or medical provider. Read and follow the directions on the label.   Wear a well-fitting supportive bra that does not have wires.    Severe engorgement can lead to blocked milk ducts and breast infection, called mastitis. Mastitis needs to be treated with antibiotics. Warning signs that you may need to have a doctor or other health professional assess your breasts:  Fever  Flu-like Symptoms  Red firm lumps or red streaks in breasts  Continued pain after the above suggestions    Therapeutic Breast Massage:  Breast massage can be beneficial for mothers experiencing engorgement, plugged ducts, mastitis and even chronic breast pain. We encourage mothers to use the general principles from the video below,  alternating hand expression and gentle breast massage to help decrease swelling and make it easier to remove the milk while you are breastfeeding or pumping.  Breast massage and Hand expression video: [email or web address]        Human Milk Storage Guidelines                                                                                                        Storage Locations & Temperatures             Type of Breast Milk Countertop  59F (25C) or colder   (room temperature) Refrigerator  65F (4C) Freezer  70F (-18C) or colder   Freshly Expressed or Pumped  Up to 4 Hours  Up to 7 days  Within 6 months is best  Up to 12 months acceptable   Thawed, Previously Frozen Up to 4 Hours Up to 1 day (24hours) NEVER refreeze human milk after it has been thawed      Leftover from a Feeding (baby did not finish bottle)  Use within 2 hours after the baby is finished feeding        Please visit our Website for more information on milk storage:       Flange fit is important for comfort and good milk supply!  Sometimes what comes with the  pump is not a good fit.    Spectra  flanges range in size from 20 mm flange to 32 mm   Medela flanges range in size from 21- 36 mm  Maymom sizes range from  15 mm- 36 mm (web site has Licensed conveyancer to measure nipples)    Pumpinpals - has a tapered flange that may be more gentle or better for elastic type nipples   sizes range from 15mm- 40mm    You can lubricate the flange with coconut oil as needed for comfort. Use a clean spoon or spatula to reduce the risk of introducing a foreign substance (bacteria) into the container.     Some mothers develop a white biofilm or plaque on nipples with extended pumping. It is normal and is not intended to be pulled off or removed.  Seek advice from your physician or a lactation consultant if redness, itching, scaling or pain develops    We suggest you view the following video for breast pump flange fit  information:    [email or web address]    How to Fit Breast Pump Flanges by iable Education                 Support at the Breastfeeding and Lactation Medicine Clinic:  Did you know? In addition to in-person visits in our clinic, you can get help and support with breastfeeding from the comfort of your home.      Breastfeeding and lactation medicine specialists are available via telemedicine to help with the transition home and any breastfeeding or infant feeding concerns. You can schedule an in-person or telemedicine visit by calling [phone]   You can also self-schedule through your MyChart account.   Visits are billed to insurance and all insurances are accepted.     Visit our website by scanning the QR code below or visiting our website at:        [email or web address]      Support Groups in the [place] Area    [facility] Support group:  Tuesdays 6:00 to 7:00 pm    In person:  Group lounge space in the office at  [facility]   [address] , [place] [address]    Call [phone] with any questions          The [facility] are inviting you to attend any of our scheduled virtual Zoom sessions below:     Session Info: Support, education, & guidance from lactation professionals. Available for FREE to any pregnant or lactating person (including those breastfeeding, pumping, chestfeeding, tandem nursing, or in any other way supplying human milk to an infant/child) in the community regardless of where they receive their care, at any stage in their feeding journey. We also host a once per month prenatal lactation education Baby Caf for expectant parents. (Registration is required for the prenatal caf ONLY, all others are drop-in)  -------------------------------------------------------------------------------------  In-Person Support    Every Wednesday, 11:00 am -  12:30 pm (Closed on Holidays). Join the group session for lactation support or   to make new friends.   [address] [place], [place].[address]    ------------------------------------------------------------------------------------  Prenatal Baby Caf    First Wednesday of each month, 5:00 pm - 6:30 pm Register at [email or web address]     -------------------------------------------------------------------------------------  Ezella Cafs    Time: Every Monday 5:00 pm - 6:30 pm (Closed on Holidays)  Join from your PC, Mac, Linux, iOS or Android: [email or web address]  Meeting ID: [email or web address]  -------------------------------------------------------------------------------------  Time: Every Tuesday 10:30 am - 12:00 pm (Closed on Holidays)  Join from your PC, Mac, Linux, iOS or Android: [email or web address]  Meeting ID: [email or web address]  -------------------------------------------------------------------------------------  Time: Every Wednesday 1:00 pm - 2:30 pm (Closed on Holidays)  Join from your PC, Mac, Linux, iOS or Android: [email or web address]  Meeting ID: [email or web address]  -------------------------------------------------------------------------------------  ** Or Dial into any of the meetings:   Or iPhone one-tap ([place]  Toll):  [id number], [id number] or [id number], [phone]#   Or Telephone:  Dial: [phone]  (You will be asked for the Meeting ID for the respective meeting)     For more information go to: [email or web address]    or  [email or web address]    Questions? Please contact: Blessing Mackintosh  [email or web address]  [phone]    [facility] [employer] are funded by the [employer] through the [employer], Chestfeeding and Lactation Friend[facility] [place]  ([facility]FF-[place])  initiative.    _________________________________________________________________________________________________________         BREASTFEEDING with the FLU/ COMMON COLD/ CORONAVIRUS    Breastmilk protects your baby from many common illnesses.  Even if YOU have coronavirus (Covid-19), your milk protects your baby in many ways.  Human milk contains essential protective components that can weaken or destroy common viruses, safeguarding your baby's health.  Lactating individuals make antibodies that are passed in milk. (This means the human milk will likely protect children, rather than harming them.)  CDC statement: As with other viral illnesses, if a lactating individual may have coronavirus (Covid-19), breastfeeding/chestfeeding should continue with proper hand washing and a face mask. If you are too sick to breastfeed/chestfeed, continue to pump and give expressed human milk.         Congratulations on taking your baby home!         Follow up: For urgent concerns or medication related questions call your pediatrician.   [facility] [phone]) [email or web address]   Manav Pierotti M Tomko, RN  Lactation Consultant

## 2024-07-11 NOTE — Telephone Encounter (Signed)
 Breastfeeding ( appt)    Primary concern:fu- per in pt provider tele med fu for R breast engorgement. Schedule 8/1 @11 :30    Mom's prenatal care Provider/OBgyn:Jaime [name]     Name/MRN of Baby:Paris Martin [id number] Lowanda Cashaw [id number]   Baby's Provider/Pediatrician:Neil Herendeen

## 2024-07-11 NOTE — Plan of Care (Signed)
 Problem: Obstetric Hemorrhage  Goal: Maximize hemodynamic stability  Outcome: Adequate for discharge     Problem: Maternal Well-Being  Goal: Patient and Family Centered Care  Outcome: Adequate for discharge  Goal: Optimize Maternal Outcomes  Outcome: Adequate for discharge  Goal: Maximize Maternal Comfort and Decrease Maternal Stress  Outcome: Adequate for discharge  Goal: Reduce the risk of infection  Outcome: Adequate for discharge     Problem: Obstetric Recovery & Postpartum s/p Delivery  Goal: Breasts are soft with nipple integrity intact  Outcome: Adequate for discharge  Goal: Fundus firm at midline  Outcome: Adequate for discharge  Goal: Moderate rubra freeflow, no purulent discharge, no foul smelling lochia  Outcome: Adequate for discharge  Goal: Appropriate behavior observed  Outcome: Adequate for discharge  Goal: Ambulates independently  Outcome: Adequate for discharge  Goal: Achieve normal elimination following delivery  Outcome: Adequate for discharge     Problem: Maternal Cardiovascular/Circulation in Pregnancy  Goal: Maximizing maternal circulation and minimizing Deep Vein Thrombosis (DVT)  Outcome: Adequate for discharge     Problem: Cesearean Delivery  Goal: Operative site will remain intact and free from infection  Outcome: Adequate for discharge     Problem: Pain  Goal: Patient's pain/discomfort is manageable  Outcome: Adequate for discharge  Goal: Report decrease in pain level  Description: Alleviation of pain or a reduction in pain to a level of comfort that is acceptable to the patient.  Outcome: Adequate for discharge     Problem: Knowledge Deficit  Goal: Patient/S.O. demonstrates understanding of disease process, treatment plan, medications, and discharge instructions.  Outcome: Adequate for discharge     Problem: Lactation utilized for newborn feeding  Goal: Demonstrates appropriate breast feeding techniques  Outcome: Adequate for discharge

## 2024-07-11 NOTE — Lactation Note (Signed)
 This note was copied from a baby's chart.  [facility] Lactation Feeding Recommendations Post Discharge  Our Lactation team would like to share feeding recommendations and follow up guidance for breastfeeding parents.  We have provided your patient additional opportunities for lactation support with the Breastfeeding Medicine Clinic contact information, where a telemed or in person appointment can be scheduled as necessary.      Patient: Madison Rose MRN: [id number]   Sex: female    AGE: 35 days  GA: 37 5/7  AGA    Infant Weight:  Birth Weight: 3200 g (7 lb 0.9 oz)   Today's Wt: 3160 g (6 lb 15.5 oz)   Percent Weight Loss: -1.25 %    Mother's Name:  Madison Rose Age: 17 y.o.  OB History   Gravida Para Term Preterm AB Living   7 2 2  0 5 3   SAB IAB Ectopic Multiple Live Births   0 0 0 1 3   Obstetric Comments   12/23/23: No carrier screening results found.   Pt states she is a carrier of sickle cell trait. States the FOB is not.    G7 P1051 is correct, does not wish to add dates of prior TABs/SABs         Mother's Medications: compatible with lactation    Delivery Date/Time: 07/06/2024 7:03 PM Delivery Type: C-Sec, Low Transverse     Recommended Feeding Plan at discharge: Alternating BF & bottle supplements part of the day    Maternal Risk Factors that may DELAY lactation: Postpartum Hemorrhage , C-section, and Advanced Maternal Age (>21yrs)    Maternal Risk Factors that may INHIBIT lactation: no nipple pores on right breast, feeding from left, seen by BFM, see Katie's note .     Infant Factors Contributing to Potential Breastfeeding Challenges: minimal breastfeeding opportunities     Oral Assessment  Palate: Gently Sloped, WNL  Coryllos Type: Type 3  Lateralization: 2 - Complete lateralization  Lift of tongue: 1 - Only edges to mid-mouth or lift with fingers rises to 1/2 of the mouth  Extension of tongue: 2 - tip past lower lip  Function Score (<4=Ankyloglossia): 5  Snapback present?:  (unable to assess fully, baby not  sucking, sleepy)  Upper Lip Flange: 2 - Yes, easily        Dawnita Molner M Tomko, RN  Lactation Consultant

## 2024-07-11 NOTE — Discharge Summary (Signed)
 Name: Madison Rose MRN: [id number] DOB: 22-Jun-1991     Admit Date: 07/05/2024   Date of Discharge: 07/11/2024     Patient was accepted for discharge to   Home or Self Care [1]       Discharge Attending Physician: Jetta Edelman, MD      Hospitalization Summary    Concise Narrative: Tazia Illescas is a 33 y.o. [id number] who was admitted for  suspected pyelonephritis at [redacted]w[redacted]d. On hospital day 2, she was found to have a BPP 2/10 for Twin B, so the decision was made to proceed with delivery via c-section. She received neuraxial anesthesia and underwent repeat low transverse cesarean delivery of two viable female infants, baby A weighing 3200 grams with APGAR's of 8/9 and baby B, weighing 2930 grams with APGAR's of 8/9. Her surgery was complicated by significant keloid scarring over prior abdominoplasty site and extensive scarring subcutaneously. Her procedure was also complicated by PPH of approx 2.5 liters for which she received misoprostol , methergine  and TXA  Please see operative note for full details. Her postpartum course was otherwise complicated a delayed PPH on POD #1 of 700  ml. She received a unit of pRBC's during her course for stabilization of H/H. Her course was additionally complicated  by a diagnosis of gestational hypertension for which Nifedipine  XL 30 mg daily was initiated and required up titration to Nifed XL 60 mg daily prior to discharge. HELLP labs remained normal. She was discharged home on PPD # 5 in stable condition after meeting all milestones. She will follow up in 1 week for incision check and again in 6 weeks for her routine postpartum visit. She has a zoom follow up with Breastfeeding medicine later this week as well                         Significant Med Changes: Yes  Nifedipine  XL 60 mg daily  Neurontin  300 mg TID prn  Lidocaine  Patch 4% daily prn  Oxycodone  IR 5 mg every 4-6 hours prn  Ibuprofen  600 mg every 6 hours prn  Tylenol  650 mg every 4-6 hours prn  Miralax  17 grams daily  prn  Simethicone  80 mg QID prn             CONSULTANT SERVICE     Breastfeeding and Lactation Medicine             Signed: DAMIEN MARTY BOOR, NP  On: 07/11/2024  at: 11:25 AM

## 2024-07-11 NOTE — Provider Consult (Signed)
 Breastfeeding and Lactation Medicine      Subjective     No chief complaint on file.     HPI:  Consulted for severe engorgement in right breast with inability to express anything from this side.    Mother reports she has been attempting to pump and hand express to no avail.  Able to pump 2 oz easily from left breast.  Both babies working on latch on left breast as well as routine formula/bottle supplementation.    LCs have provided advice on ice, reverse massage.      History of this with first 15 years ago:  BF from left x 1 month, took another month to fully wean breasts.  Had mastitis in right breast during first month.    Left breast has always been larger.      Plan is for discharge home today with both babies.  Mother's goal is to continue to work on breast feeding on left side, with ongoing engorgement management on right.  If unable to produce on right and engorgement remains painful, would like to wean.      ROS: no fevers/chills ; taking ibuprofen  and oxycodone  for post-operative pain that is manageable.      PMH/PSH:  Patient's mother with same issue in right breast   No breast surgeries or chest injuries   Asked about piercings, reports was interested in nipple piercing and was told unable due to anatomy (?)    Objective   Physical Exam:  Vitals: BP (!) 132/95   Pulse 99   Temp 36.7 C (98.1 F) (Temporal)   Resp 20   LMP 10/16/2023 (Exact Date)   SpO2 98%   Breastfeeding Yes     Breasts:  Right breast visibly engorged, palpably full, mildly tender, with no erythema. Right nipple with one possible nipple pore but unable to express anything after attempts at therapeutic lymphatic massage.   Left nipple centrally tucked but everts; able to hand express drops easily and leaks throughout visit.         Assessment   Madison Rose is a 33 y.o. 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    Plan   Continue diligent engorgement management: comfortable/supportive bra once home; ice often to breast; reviewed and demonstrated reverse lymphatic massage, enc to try at home with oil and then try hand expression afterwards, ibuprofen  around the clock for pain   Reviewed option for cabergoline  if desires to fully wean   Continue to work on latch with both babies on left, pumping efforts to optimize/provide EBM   Scheduled for telemedicine follow-up with BFLM for this Friday, encouraged to call clinic sooner PRN     Rollo Harrier, NP
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
## 2024-07-12 LAB — EKG 12-LEAD
P: 31 deg
PR: 144 ms
QRS: 31 deg
QRSD: 86 ms
QT: 362 ms
QTc: 454 ms
Rate: 95 {beats}/min
T: 30 deg

## 2024-07-12 NOTE — Progress Notes (Deleted)
 Madison Rose

## 2024-07-12 NOTE — Progress Notes (Deleted)
 [facility], Obstetrics and Gynecology    Chief Complaint:     Incision check    Subjective:     Madison Rose is a 33 y.o. [phone] female who presents for a postoperative examination.     Delivery Information  Information for the patient's newborn:  Aryiah, Monterosso [[id number]]   Delivery Date: 07/06/2024 7:03 PM   Gestational Age: [redacted]w[redacted]d  Pregnancy Complications:None          Type of Delivery: C-sec, low transverse [1000]  Delivering Clinician(s): Fleeta Phlegm, Jane  Labor Complications: None [0]  Additional Complications: None  Other Procedures: None    Living Status at Birth: Living [1]  Current Living Status: Alive [1]   Birthweight: 3200 g (7 lb 0.9 oz)      Information for the patient's newborn:  Ambermarie, Honeyman [[id number]]   Delivery Date: 07/06/2024 7:04 PM   Gestational Age: [redacted]w[redacted]d  Pregnancy Complications:None          Type of Delivery: C-sec, low transverse [1000]  Delivering Clinician(s): Fleeta Phlegm, Jane  Labor Complications: None [0]  Additional Complications: None  Other Procedures: None    Living Status at Birth: Living [1]  Current Living Status: Alive [1]   Birthweight: 2930 g (6 lb 7.4 oz)        She is now 1 weeks postpartum from a      Daionna, Crossland [[id number]]   C-Sec, Low Transverse      Quinci, Gavidia [[id number]]   C-Sec, Low Transverse         Hospitalization Summary     Concise Narrative: Jaleiah Asay is a 33 y.o. [id number] who was admitted for  suspected pyelonephritis at [redacted]w[redacted]d. On hospital day 2, she was found to have a BPP 2/10 for Twin B, so the decision was made to proceed with delivery via c-section. She received neuraxial anesthesia and underwent repeat low transverse cesarean delivery of two viable female infants, baby A weighing 3200 grams with APGAR's of 8/9 and baby B, weighing 2930 grams with APGAR's of 8/9. Her surgery was complicated by significant keloid scarring over prior abdominoplasty site and extensive scarring subcutaneously. Her procedure was also complicated by PPH of approx 2.5  liters for which she received misoprostol , methergine  and TXA  Please see operative note for full details. Her postpartum course was otherwise complicated a delayed PPH on POD #1 of 700  ml. She received a unit of pRBC's during her course for stabilization of H/H. Her course was additionally complicated  by a diagnosis of gestational hypertension for which Nifedipine  XL 30 mg daily was initiated and required up titration to Nifed XL 60 mg daily prior to discharge. HELLP labs remained normal. She was discharged home on PPD # 5 in stable condition after meeting all milestones. She will follow up in 1 week for incision check and again in 6 weeks for her routine postpartum visit. She has a zoom follow up with Breastfeeding medicine later this week as well        Significant Med Changes: Yes  Nifedipine  XL 60 mg daily  Neurontin  300 mg TID prn  Lidocaine  Patch 4% daily prn  Oxycodone  IR 5 mg every 4-6 hours prn  Ibuprofen  600 mg every 6 hours prn  Tylenol  650 mg every 4-6 hours prn  Miralax  17 grams daily prn  Simethicone  80 mg QID prn      Her postpartum course has been  {DESC; PRENANCY COMPLICATIONS:18989}.    The patient {PP  Dk:[id number]}    The patient's Allergies, Meds, PMH, PSH, FH and SH were reviewed and updated as appropriate.     Objective:   There were no vitals taken for this visit.  There were no vitals taken for this visit.    OB/GYN Physical Exam    Assessment/Plan:      Patient is a H2E7946 female s/p      Tamyia, Minich [[id number]]   C-Sec, Low Transverse      Gael, Delude [[id number]]   C-Sec, Low Transverse here for postoperative exam      She is doing {DESC; WELL/POORLY/MARGINALLY:18601}.    Pregnancy risks:   Di-Di twin pregnancy  Keliod scarring over a piror abdominoplasty site with extensive subcutaneous scarring  PPH of 2.5 liters  Delayed PPH of 700 ml on POD 1  Gestational hypertension on 60 mg nifedipine  po daily      Status: {PP [phone]}  Infant Feeding: {PP Infant  feeding:[id number]::  }  Depression Assessment: {PP Depression Eojw:[id number]::  }  Support: {PP Support:[id number]:: }  Contraception: {PP Contraception:[phone]:: }  Health Maintenance:    - Reviewed current Pap/HPV screening guidelines  - Last pap smear was 09/1021 with no abnormalities.  Pt is not due for pap smear today.     - Continue with nifedipine  60 mg po daily.   - Continue with PNV and iron  supplement.   - Attached and reviewed postpartum care, signs and symptoms of postpartum depression, after care for c/s delivery instructions, signs and symptoms of infection, signs and symptoms of pre-eclampsia, [name] Breastfeeding Medicine contact information.  Pt to notify [facility] with any signs or symptoms of infection, signs or symptoms of pre-eclampsia, elevated home BP greater than 140/90, signs and symptoms of postpartum depression or concerns.     - Pt has telemedicine lactation consult with [facility] Breastfeeding Medicine on 08/01.       RTC: As scheduled on 09/03 for postpartum visit or prn.

## 2024-07-13 ENCOUNTER — Telehealth: Payer: Self-pay

## 2024-07-13 ENCOUNTER — Ambulatory Visit

## 2024-07-13 ENCOUNTER — Observation Stay: Payer: MEDICAID

## 2024-07-13 ENCOUNTER — Observation Stay
Admission: AD | Admit: 2024-07-13 | Discharge: 2024-07-13 | Disposition: A | Discharge status: Home / Self Care | Payer: MEDICAID | Source: Ambulatory Visit | Attending: Obstetrics and Gynecology | Admitting: Obstetrics and Gynecology

## 2024-07-13 ENCOUNTER — Other Ambulatory Visit: Payer: Self-pay

## 2024-07-13 DIAGNOSIS — O9122 Nonpurulent mastitis associated with the puerperium: Principal | ICD-10-CM | POA: Insufficient documentation

## 2024-07-13 DIAGNOSIS — O9123 Nonpurulent mastitis associated with lactation: Secondary | ICD-10-CM

## 2024-07-13 DIAGNOSIS — O1205 Gestational edema, complicating the puerperium: Secondary | ICD-10-CM | POA: Insufficient documentation

## 2024-07-13 DIAGNOSIS — R519 Headache, unspecified: Secondary | ICD-10-CM | POA: Insufficient documentation

## 2024-07-13 DIAGNOSIS — N61 Mastitis without abscess: Secondary | ICD-10-CM | POA: Insufficient documentation

## 2024-07-13 DIAGNOSIS — R928 Other abnormal and inconclusive findings on diagnostic imaging of breast: Secondary | ICD-10-CM

## 2024-07-13 LAB — CBC AND DIFFERENTIAL
Baso # K/uL: 0 THOU/uL (ref 0.0–0.2)
Eos # K/uL: 0.2 THOU/uL (ref 0.0–0.5)
Hematocrit: 28 % — ABNORMAL LOW (ref 34–49)
Hemoglobin: 9 g/dL — ABNORMAL LOW (ref 11.2–16.0)
IMM Granulocytes #: 0.1 THOU/uL — ABNORMAL HIGH
IMM Granulocytes: 0.7 %
Lymph # K/uL: 1.6 THOU/uL (ref 1.0–5.0)
MCV: 92 fL (ref 75–100)
Mono # K/uL: 0.9 THOU/uL (ref 0.1–1.0)
Neut # K/uL: 10.1 THOU/uL — ABNORMAL HIGH (ref 1.5–6.5)
Nucl RBC # K/uL: 0 THOU/uL (ref 0.0–0.1)
Nucl RBC %: 0 /100{WBCs} (ref 0.0–0.2)
Platelets: 313 THOU/uL (ref 150–450)
RBC: 3.1 MIL/uL — ABNORMAL LOW (ref 4.0–5.5)
RDW: 13.7 % (ref 0.0–15.0)
Seg Neut %: 78.6 %
WBC: 12.8 THOU/uL — ABNORMAL HIGH (ref 3.5–11.0)

## 2024-07-13 MED ORDER — ACETAMINOPHEN 500 MG PO TABS *I*
1000.0000 mg | ORAL_TABLET | Freq: Once | ORAL | Status: AC
Start: 2024-07-13 — End: 2024-07-13
  Administered 2024-07-13: 1000 mg via ORAL
  Filled 2024-07-13: qty 2

## 2024-07-13 MED ORDER — METOCLOPRAMIDE HCL 10 MG PO TABS *I*
10.0000 mg | ORAL_TABLET | Freq: Once | ORAL | Status: AC
Start: 2024-07-13 — End: 2024-07-13
  Administered 2024-07-13: 10 mg via ORAL
  Filled 2024-07-13: qty 1

## 2024-07-13 MED ORDER — DICLOXACILLIN SODIUM 500 MG PO CAPS *I*
500.0000 mg | ORAL_CAPSULE | Freq: Four times a day (QID) | ORAL | 0 refills | Status: AC
Start: 2024-07-13 — End: 2024-07-23
  Filled 2024-07-13: qty 40, 10d supply, fill #0

## 2024-07-13 MED ORDER — IBUPROFEN 600 MG PO TABS *I*
600.0000 mg | ORAL_TABLET | Freq: Once | ORAL | Status: AC
Start: 2024-07-13 — End: 2024-07-13
  Administered 2024-07-13: 600 mg via ORAL
  Filled 2024-07-13: qty 1

## 2024-07-13 MED ORDER — DICLOXACILLIN SODIUM 500 MG PO CAPS *I*
500.0000 mg | ORAL_CAPSULE | Freq: Four times a day (QID) | ORAL | Status: DC
Start: 2024-07-13 — End: 2024-07-20
  Administered 2024-07-13: 500 mg via ORAL
  Filled 2024-07-13 (×2): qty 1

## 2024-07-13 NOTE — Lactation Note (Signed)
 Lactation Consultant Visit   Patient: Madison Rose            AGE: 33 y.o.              Corrected GA: blank  MRN: [id number]     Maternal Information   Birth Parent Name:   This patient's mother is not on file.   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 Patient is known to the lactation service and is s/p BFLM provider consult with Rollo Harrier, NP for engorgement consult (see Note with provider recommendations from 7/28). At time of LC assessment, breasts engorged bilaterally with palpable congestion to base of both breasts. Mom able to tolerate palpation and assessment. Mom reports that she has been applying ice throughout the night, providing therapeutic reverse massage, and taking motrin  as prescribed, to minimal relief. Mom reports worsening of symptoms at night. Mom reports desire to fully wean ad is requesting Cabergoline  at this time. LC provided emotional support surrounding this decision and messaged covering OB team on behalf of patient. Can refer to [facility] Lactational Mastitis policy for ruling in or out for mastitis if further guidance is needed in directing care. LC offered continued lactation support.     Answered all parents questions.    Reviewed: General Education Provided : Engorgement management, Goal for full milk supply (750 ml/day pumped), How to contact LC, Mastitis warning signs, Resources for help, post discharge  Latch Specific  Teaching : None done this visit    Please see Lactation flowsheet for further details.    Plan   Follow-up with lactation daily while dyad inpatient or by parental request.  Time Spent during Visit : 10-20 mins    Feeding Plan:   Recommended Feeding Plan:  (weaning)    Plan with Parent: Return as needed    BFLM Consult?: Yes, has been seen    Harlene Bash, RN, [facility]   Lactation Consultant

## 2024-07-13 NOTE — Telephone Encounter (Signed)
 TC from pt with concerns of an infection. Pt reports that last night she had a fever of 104. She reports that she has been breast feeding from her left breast. She can't feed from her right because it does not release milk. She says both breasts are engorged, painful and that she has been having cold sweats today and overall feels really unwell. States the breast pain is 10/10. Denies nausea and vomiting. Reports fever is lower today but she has been taking tylenol  around the clock. Pt also reports her incision has been very painful rating it a 10/10. Denies drainage or swelling. Reports it look intact.     Pt has appointment for 2:30 w/ J. [name] but reports she can't come because of her pediatrician appointments for her twins @3pm  and their bili levels are high.     Discussed w/ Ami NP and would like pt to proceed to triage after her pediatrics appointment d/t history and symptoms/elevated temperature. Pt verbalizes understanding and is agreeable to plan.

## 2024-07-13 NOTE — OB Triage Note (Addendum)
 OBSTETRICS TRIAGE NOTE      Chief Complaint: breast pain, fever    HPI     Madison Rose is a 33 y.o. [phone] now PPD7 s/p CS at [redacted]w[redacted]d who presents to triage with concerns for breast pain and fever at home. She initially started breastfeeding and was seen by lactation inpatient. She reports inability to produce/express milk from R side due to absent nipple pores. She was exclusively breastfeeding from her L breast, reports some poor latch and concern that babies were losing weight, so she stopped breast feeding yesterday. She has been feeling L breast pain for a few days, before she started weaning. Has been using ice, massaging - minimal relief.     Endorsed fevers to 103 last night. Has been taking tylenol  and ibuprofen  around the clock. Feeling general malaise, intense chills and sweats. Denies abnormal nipple discharge. Reports poor PO intake, denies nausea.    She reports recovery has otherwise been going well. Having some abdominal pain, fairly well controlled with tylenol  and ibuprofen . Has only taken the oxy once so far because it makes her sleepy. Minimal postpartum bleeding.    She also reports a headache - present for 2 days, unrelieved by tylenol /ibuprofen . Also a bit lightheaded.    Review of Systems     Constitutional: Endorses fevers, malaise.  Cardiovascular: Denies chest pain or palpitations.  Respiratory: Denies shortness of breath  Gastrointestinal: Denies nausea/vomiting.  Denies significant abdominal pain.  Skin/Extremities: Denies new or worsening peripheral edema. Endorses redness/warmth skin changes (breast).  Genitourinary: Denies dysuria.  Denies urinary frequency or urgency.  Minimal postpartum vaginal bleeding    Physical Exam     Vitals:    07/13/24 1656 07/13/24 1700   BP: 123/73    Pulse: 109 109   Resp: 16    Temp: 36 C (96.8 F)    TempSrc: Temporal    SpO2: 99% 99%       Mental Status: Alert and oriented x 3  Cardiovascular: Regular rate  Breast: R breast normal temperature, no  warmth noted, mildly tender to palpation. Some nodularity/glands noted, no milk discharged from R nipple. L breast warm to touch and erythematous. Tender to palpation, particularly over area of fluctuance inferior to L nipple, ~3x1 cm. Milk expressed from nipple on palpation, appears white and non malodorous  Respiratory: Normal respiratory effort  Abdomen: Soft, nondistended, appropriately tender  Incision: c/d/I, skin edges well approximated, no c/f hematoma or infection  Extremities: +1 edema bilaterally, no calf tenderness      Assessment & Plan     Madison Rose is a 33 y.o. H2E7946 now PPD7 s/p CS at [redacted]w[redacted]d.  Concern for mastitis given high fever at home, malaise, and breast exam today. Given area of fluctuance on palpation of L breast, will obtain breast US  to evaluate for abscess to determine proper management and disposition. Will obtain CBC w/ diff and give first dose of dicloxacillin  due to mastitis. Appreciate lactation consult.    D/w Dr. Alysia and Dr. Dario    Care signed out to night team.     Ronita Sharps, MD  Obstetrics & Gynecology PGY-2  Pager [phone]    I received sign out regarding this patient and resumed her care following change of shift. The patient has remained afebrile and feels well overall in OBT. CBC reviewed and notable for a mild leukocytosis to 12.8. Breast ultrasound obtained and wet read notable for absence of breast abscess. Final read is pending (will likely be  read tomorrow). Reviewed treatment plan with patient: 10 days PO Dicloxacillin  500 mg QID. Additionally discussed patient's wishes to suppress breast milk production with cabergoline . We recommended avoiding cabergoline  at this time in order to facilitate healing in the setting of suspected mastitis. She will continue to follow up with lactation medicine (has appointment with Rollo Harrier NP on 07/15/24) and cabergoline  can be discussed further once she has finished her treatment with the antibiotic. Advised patient  to call her lactation office tomorrow to update her providers on the plan of care.     Minta Sewer, MD  Obstetrics & Gynecology - PGY-2  Pager [phone]

## 2024-07-13 NOTE — OB Triage Note (Signed)
 Pt. came into triage complaining of a fever and 9/10 breast pain. Also complains of pain from her incision site and a headache.

## 2024-07-13 NOTE — OB Triage Note (Signed)
 Patient discharged to home ambulatory in stable condition, dressed appropriately for the weather. Patient provided AVS via MyChart per patient preference and verbalized understanding of discharge instructions.     [place] J Renzo Vincelette, RN

## 2024-07-14 LAB — SURGICAL PATHOLOGY

## 2024-07-15 ENCOUNTER — Ambulatory Visit: Payer: MEDICAID | Admitting: Pediatrics

## 2024-07-15 ENCOUNTER — Telehealth: Payer: Self-pay

## 2024-07-15 ENCOUNTER — Other Ambulatory Visit: Payer: Self-pay

## 2024-07-15 DIAGNOSIS — N6459 Other signs and symptoms in breast: Secondary | ICD-10-CM

## 2024-07-15 DIAGNOSIS — N12 Tubulo-interstitial nephritis, not specified as acute or chronic: Secondary | ICD-10-CM

## 2024-07-15 MED ORDER — CABERGOLINE 0.5 MG PO TABS *I*
ORAL_TABLET | ORAL | 0 refills | Status: DC
  Filled 2024-07-15: qty 8, 15d supply, fill #0
  Filled 2024-07-15: qty 8, 28d supply, fill #0

## 2024-07-15 NOTE — Telephone Encounter (Signed)
 Called patient as she was scheduled for tele health video visit for 11:30 and has not signed in.   Patient said she is weaning.   Her OB started her on Dicloxacillin  for mastitis in her Left breast.  Started 2 days ago. She is taking 4 x a day for 10 days.   She has worked down to pumping her left side 2 x a day and gets 2 oz in 5-10 minutes. She does not pump the right as she has never been able to get milk to come out.   She is using Ice and Ibuprofen  for comfort.   She is interested in having our provider call her for further suggestions if able.   Praise given and reviewed wean process.

## 2024-07-15 NOTE — Progress Notes (Signed)
 Telephone Visit     Mode of Communication: Audio Only: Reason - Patient does not consent to using video     The service provided today can be effectively delivered without a visual or in-person component and includes all clinically appropriate documentation requirements as if provided in-person. Audio-visual will continue to be an option for this patient as well as in-person visits.    Location of Patient: Home    Location of Telemedicine Provider: Clinic    Consent was obtained from the patient to complete this telemedicine visit; including the potential for financial liability.    BREASTFEEDING MEDICINE PROVIDER NOTE    Chief Complaint:   Breast Pain    HPI SEE LC NOTE, additional details include:  Patient desires cabergoline ; working on weaning left breast, down to 2-3 pumps per day for 2 oz at a time.    Was seen by writer inpatient on 7/28 for complete inability to express from right breast, history of this with 1st.    Triage visit on 7/30 for high fever, mastitis in left breast; cessation of breastfeeding the day prior.  Feeling better on dicloxacillin ;  using ice on both breasts and ibuprofen  around the clock.    No attempts to express from right breast in several days.      Mother PMH/PSH/Meds/Allergies/ROS reviewed from chart and LC note. 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  Mother desires to expedite weaning with use of cabergoline  today.        PLAN components:  - finish full course of antibiotic   - cabergoline  discussed and prescribed, full instructions as below   - continue ice and ibuprofen  as needed for breast pain    To wean with cabergoline  0.5 mg tablets:  Take  1 tablet on day 1 of weaning  Then, 1 tablet on day 4 (72 hours later)  Then, if you still feel full / engorged / leaking take another 1 tablet on day 7 (72 hours after 2nd dose)    Side effects can include fatigue, dizziness, stomachache, headache, constipation, but are rare. A similar drug, bromocriptine, has caused blood clots, we have not seen this with cabergoline . We recommend you have someone else around to take care of the children if you are taking this for the first time. If you experience uncomfortable nausea, please let us  know and we can prescribe Zofran  (ondansetron ) which may help.    IF PUMPING: Pump only to comfort: this means you pump approximately on your schedule, but only until you feel better, not until empty. You may continue to have some clogs, or start having more clogs. Weaning this way tends to make people feel like they have lumpy breasts. The milk can be fed to the baby.    Discussed plan, offered praise and encouragement; family verbalized understanding.    FOLLOW-UP: as needed for now per parent's preference   Writer to continue to be available for breastfeeding support as needed or by referral    Izetta Harrier FNP-C, IBCLC

## 2024-07-15 NOTE — Patient Instructions (Signed)
To wean with cabergoline 0.5 mg tablets:  Take 1 tablet on day 1 of weaning  Then, 1 tablet on day 4 (72 hours later)  Then, if you still feel full / engorged / leaking take another 1 tablet on day 7 (72 hours after 2nd dose)    Side effects can include fatigue, dizziness, stomachache, headache, constipation, but are rare. A similar drug, bromocriptine, has caused blood clots, we have not seen this with cabergoline. We recommend you have someone else around to take care of the children if you are taking this for the first time. If you experience uncomfortable nausea, please let us know and we can prescribe Zofran (ondansetron) which may help.    IF PUMPING: Pump only to comfort: this means you pump approximately on your schedule, but only until you feel better, not until "empty." You may continue to have some clogs, or start having more clogs. Weaning this way tends to make people feel like they have "lumpy breasts." The milk can be fed to the baby.    IF BREASTFEEDING: you can continue to breastfeed while your milk dries up.    If you have any problems, please let us know. If you aren't feeling better in 1 week, please give [place] a call at [phone])

## 2024-07-15 NOTE — Progress Notes (Signed)
 Transitions of Care: Madison Rose, Madison Rose [[id number]] C-Sec, Madison Tabatha, Rose [[id number]] C-Sec, Madison Transverse delivery on 07/06/2024 at [redacted]w[redacted]d.8/4- Reached patient for PPM call- taking dynapen  for mastitis, and c/o foul smelling urine and urinary frequency, voiding only small amounts. She thinks she may have some flank pain. Had pyelo during pregnancy and was taking PPM Hct:   Lab results: 07/30/251855 Hematocrit 28* Discharged with iron  RX? Previous Rx, patient will refill and take.Newly diagnosed gestational hypertension. Days since hospital discharge: 7Recall of recent AE:Rjwwnu recall readings as she is caring for the babies-- BP monitor display is green with each reading (upper limit for green readings programmed at 130/85)MEDS:New prescriptions at discharge: Oxycodone  and cabergoline , ibuprofen , Neurontin , miralax , mylicon, and new dynapen ..Antihypertensives: Nifedipine  XL 60mg , daily. . Taking all doses as prescribed? Madison barriers getting medications? [facility] CARE SERVICES:Visiting nurse referral accepted? No home careAgency: N/aSCREENING:PHYSICAL WELLBEING:Any problems since delivery? DeniesHA, visual changes, RUQ pain, new edema, SOB, nausea/vomiting? DeniesReminded about symptoms of PPM pre eclampsia and to call office with any new symptoms. Siren notes c-section incision appears healing well. Pain is controlled with current analgesics. PSYCHOLOGICAL WELLBEING:Have you been experiencing any symptoms of postpartum depression?  DeniesSOCIAL [name] are your supports? Has support with FOB and her daughterDo you have any safety concerns for you or baby at home?: NoSMOKINGAre you a current smoker? No (how much? N/a)INFANT CARE AND FEEDING:How are you adjusting to caring for your new baby? Doing wellAre you providing breast milk or formula? FormulaWhat are your goals/any  challenges or concerns? Current mastitis resolving.What pediatrician are you taking the baby to? [facility]Have you had any difficulties getting the baby to appointments? NoSEXUALITY/CONTRACEPTION/BIRTH SPACINGMethod of contraception? Nexplanon planned with PPM- Appt time adjustedAny questions about method of contraception? NoPLAN:In-person postpartum appointment scheduled on 9/3 with [name], NP. Do you anticipate any problems attending your postpartum appointment?: NoPatient encouraged to call the office with any new or changing concerns.

## 2024-07-18 ENCOUNTER — Other Ambulatory Visit: Payer: Self-pay

## 2024-07-18 ENCOUNTER — Encounter: Payer: Self-pay | Admitting: Obstetrics & Gynecology

## 2024-07-20 NOTE — Telephone Encounter (Signed)
 8/4- Unable to reach by phone to update about UA plan8/6- reached [name] who is presently at [facility] and will proceed to the lab for urine testing.

## 2024-07-25 ENCOUNTER — Other Ambulatory Visit: Payer: Self-pay

## 2024-07-25 DIAGNOSIS — O99012 Anemia complicating pregnancy, second trimester: Secondary | ICD-10-CM

## 2024-07-25 DIAGNOSIS — O30042 Twin pregnancy, dichorionic/diamniotic, second trimester: Secondary | ICD-10-CM

## 2024-07-26 ENCOUNTER — Other Ambulatory Visit: Payer: Self-pay

## 2024-08-01 ENCOUNTER — Other Ambulatory Visit: Payer: Self-pay

## 2024-08-11 ENCOUNTER — Emergency Department: Payer: MEDICAID

## 2024-08-11 ENCOUNTER — Observation Stay
Admission: EM | Admit: 2024-08-11 | Discharge: 2024-08-12 | Disposition: A | Discharge status: Home / Self Care | Payer: MEDICAID | Source: Ambulatory Visit | Attending: Emergency Medicine | Admitting: Emergency Medicine

## 2024-08-11 ENCOUNTER — Other Ambulatory Visit: Payer: Self-pay

## 2024-08-11 DIAGNOSIS — Z87440 Personal history of urinary (tract) infections: Secondary | ICD-10-CM | POA: Insufficient documentation

## 2024-08-11 DIAGNOSIS — R519 Headache, unspecified: Secondary | ICD-10-CM | POA: Insufficient documentation

## 2024-08-11 DIAGNOSIS — Z0389 Encounter for observation for other suspected diseases and conditions ruled out: Secondary | ICD-10-CM

## 2024-08-11 DIAGNOSIS — Z98891 History of uterine scar from previous surgery: Secondary | ICD-10-CM | POA: Insufficient documentation

## 2024-08-11 DIAGNOSIS — O99893 Other specified diseases and conditions complicating puerperium: Secondary | ICD-10-CM | POA: Insufficient documentation

## 2024-08-11 DIAGNOSIS — N12 Tubulo-interstitial nephritis, not specified as acute or chronic: Secondary | ICD-10-CM

## 2024-08-11 DIAGNOSIS — N3001 Acute cystitis with hematuria: Principal | ICD-10-CM

## 2024-08-11 DIAGNOSIS — R8271 Bacteriuria: Secondary | ICD-10-CM | POA: Insufficient documentation

## 2024-08-11 DIAGNOSIS — O864 Pyrexia of unknown origin following delivery: Principal | ICD-10-CM | POA: Insufficient documentation

## 2024-08-11 DIAGNOSIS — Z1152 Encounter for screening for COVID-19: Secondary | ICD-10-CM | POA: Insufficient documentation

## 2024-08-11 DIAGNOSIS — R000 Tachycardia, unspecified: Secondary | ICD-10-CM | POA: Insufficient documentation

## 2024-08-11 DIAGNOSIS — M549 Dorsalgia, unspecified: Secondary | ICD-10-CM | POA: Insufficient documentation

## 2024-08-11 DIAGNOSIS — R82998 Other abnormal findings in urine: Secondary | ICD-10-CM | POA: Insufficient documentation

## 2024-08-11 DIAGNOSIS — Z1159 Encounter for screening for other viral diseases: Secondary | ICD-10-CM | POA: Insufficient documentation

## 2024-08-11 DIAGNOSIS — R9431 Abnormal electrocardiogram [ECG] [EKG]: Secondary | ICD-10-CM

## 2024-08-11 DIAGNOSIS — R5381 Other malaise: Secondary | ICD-10-CM | POA: Insufficient documentation

## 2024-08-11 LAB — URINALYSIS WITH REFLEX TO CULTURE
Glucose,UA: NEGATIVE
Ketones, UA: NEGATIVE
Nitrite,UA: NEGATIVE
Specific Gravity,UA: 1.015 (ref 1.002–1.030)
pH,UA: 6.5 (ref 5.0–8.0)

## 2024-08-11 LAB — CBC AND DIFFERENTIAL
Baso # K/uL: 0 THOU/uL (ref 0.0–0.2)
Eos # K/uL: 0 THOU/uL (ref 0.0–0.5)
Hematocrit: 32 % — ABNORMAL LOW (ref 34–49)
Hemoglobin: 10.6 g/dL — ABNORMAL LOW (ref 11.2–16.0)
IMM Granulocytes #: 0 THOU/uL
IMM Granulocytes: 0.3 %
Lymph # K/uL: 0.4 THOU/uL — ABNORMAL LOW (ref 1.0–5.0)
MCV: 86 fL (ref 75–100)
Mono # K/uL: 0.1 THOU/uL (ref 0.1–1.0)
Neut # K/uL: 5.6 THOU/uL (ref 1.5–6.5)
Nucl RBC # K/uL: 0 THOU/uL (ref 0.0–0.1)
Nucl RBC %: 0 /100{WBCs} (ref 0.0–0.2)
Platelets: 269 THOU/uL (ref 150–450)
RBC: 3.8 MIL/uL — ABNORMAL LOW (ref 4.0–5.5)
RDW: 13.8 % (ref 0.0–15.0)
Seg Neut %: 91.8 %
WBC: 6.1 THOU/uL (ref 3.5–11.0)

## 2024-08-11 LAB — COVID/INFLUENZA A & B/RSV NAAT (PCR)
COVID-19 NAAT (PCR): NEGATIVE
Influenza A NAAT (PCR): NEGATIVE
Influenza B NAAT (PCR): NEGATIVE
RSV NAAT (PCR): NEGATIVE

## 2024-08-11 LAB — BASIC METABOLIC PANEL
Anion Gap: 10 (ref 7–16)
CO2: 27 mmol/L (ref 20–28)
Calcium: 8.6 mg/dL — ABNORMAL LOW (ref 8.8–10.2)
Chloride: 103 mmol/L (ref 96–108)
Creatinine: 1 mg/dL — ABNORMAL HIGH (ref 0.51–0.95)
Glucose: 78 mg/dL (ref 60–99)
Lab: 9 mg/dL (ref 6–20)
Potassium: 3.7 mmol/L (ref 3.3–5.1)
Sodium: 140 mmol/L (ref 133–145)
eGFR BY CREAT: 75

## 2024-08-11 LAB — URINE MICROSCOPIC (IQ200)

## 2024-08-11 LAB — LACTATE, PLASMA: Lactate: 1.6 mmol/L (ref 0.5–2.2)

## 2024-08-11 LAB — HOLD LAVENDER

## 2024-08-11 LAB — HOLD BLUE

## 2024-08-11 MED ORDER — SODIUM CHLORIDE 0.9 % IV BOLUS *I*
1000.0000 mL | Freq: Once | Status: AC
Start: 2024-08-11 — End: 2024-08-11
  Administered 2024-08-11: 1000 mL via INTRAVENOUS

## 2024-08-11 MED ORDER — CEFPODOXIME PROXETIL 200 MG PO TABS *I*
200.0000 mg | ORAL_TABLET | Freq: Two times a day (BID) | ORAL | 0 refills | Status: AC
Start: 2024-08-11 — End: 2024-08-19
  Filled 2024-08-11: qty 14, 7d supply, fill #0

## 2024-08-11 MED ORDER — KETOROLAC TROMETHAMINE 30 MG/ML IJ SOLN *I*
15.0000 mg | Freq: Once | INTRAMUSCULAR | Status: AC
Start: 2024-08-11 — End: 2024-08-11
  Administered 2024-08-11: 15 mg via INTRAVENOUS
  Filled 2024-08-11: qty 1

## 2024-08-11 MED ORDER — ACETAMINOPHEN 500 MG PO TABS *I*
1000.0000 mg | ORAL_TABLET | Freq: Once | ORAL | Status: AC
Start: 2024-08-11 — End: 2024-08-11
  Administered 2024-08-11: 1000 mg via ORAL
  Filled 2024-08-11: qty 2

## 2024-08-11 MED ORDER — SODIUM CHLORIDE 0.9 % FLUSH FOR PUMPS *I*
0.0000 mL/h | INTRAVENOUS | Status: DC | PRN
Start: 2024-08-11 — End: 2024-08-12

## 2024-08-11 MED ORDER — DEXTROSE 5 % FLUSH FOR PUMPS *I*
0.0000 mL/h | INTRAVENOUS | Status: DC | PRN
Start: 2024-08-11 — End: 2024-08-12

## 2024-08-11 MED ORDER — CEFTRIAXONE SODIUM 1 G IN STERILE WATER 10ML SYRINGE *I*
1000.0000 mg | Freq: Once | INTRAVENOUS | Status: AC
Start: 2024-08-11 — End: 2024-08-11
  Administered 2024-08-11: 1000 mg via INTRAVENOUS
  Filled 2024-08-11: qty 10

## 2024-08-11 NOTE — ED Triage Notes (Signed)
 20 days post partum c-section delivery, no complaints at incision site. Hx Recurrent UTIs throughout pregnancy + post-partum, also mastitis. Today, endorsing N/V, dizziness, weakness, R-sided chest pain, headache. OB requesting pt be stabilized in ED prior to being seen upstairs. Pt arrives febrile and tachycardic. EKG in triage.   Blood Glucose Meter (mg/dl): [id number] medications given: YesCardiac Meds: Aspirin  (324 mg, PO)Anti-Emetics: Zofran  (x1)

## 2024-08-11 NOTE — ED Provider Progress Notes (Cosign Needed)
 ED Provider Progress NoteI received sign-out for this patient at 11:28 PM on 08/11/24. Briefly, Madison Rose is a 33 y.o. female [phone] on PPD 20 with PMHx significant for frequent UTIs/pyelonephritis who presented with fever, chills, headache, back pain, & foul-smelling urine. Work-up initiated by previous team with labs & urinalysis; UA concerning for UTI. Interventions initiated by previous team with tylenol , 2L NS, toradol , & ceftriaxone . Consults initiated by previous team with OBGYN.At time of sign-out, pending re-evaluation.11:33 PM: On my evaluation, patient reports improvement in symptoms following fluids/IV antibiotics. Reports that she is looking forward to going home to see her newborns. Discussed options for continued treatment with OB recommending transfer to [facility] triage for further evaluation, which patient is amenable to. Shared decision making utilized regarding admission; patient affirms that she will closely monitor her symptoms at home & return to ED should any concerns arise. Additionally will provide prescription for cefpodoxime  for treatment of UTI. Instructions & return precautions provided; all questions answered.   Hermina Barnard, DO, 08/11/2024, 11:28 PM Juli Herring, DOResident08/29/25 [phone]

## 2024-08-11 NOTE — Discharge Instructions (Addendum)
 You were seen & evaluated in the ED today (08/11/24) for urinary tract infection. You were treated in the ED with an antibiotic called ceftriaxone  as well as IV fluids & tylenol /toradol . Take the antibiotic as prescribed. Seek care if you develop:- new or worsening chest pain;- new or worsening shortness of breath;- fever despite taking antibiotics as prescribed;- any worsening symptoms;- other concerning symptoms.

## 2024-08-11 NOTE — ED Notes (Signed)
 Report Given To314 nurseDescriptive Sentence / Reason for Admission Patient presents to ED 20 days post partum c-section delivery w twins, no complaints at incision site. Hx recurrent UTIs throughout pregnancy + post-partum, also mastitis. Today (8/28), endorsing N/V, dizziness, weakness, R-sided chest pain, headache. OB requesting that patient be stabilized in ED prior to being seen upstairs. Patient arrives febrile and tachycardic.Active Issues / Relevant Events Full CodeA&Ox4AmbulatoryTo Do ListVS/AMeds per MARAnticipatory Guidance / Discharge PlanningDispo?

## 2024-08-11 NOTE — ED Provider Notes (Signed)
 History Chief Complaint Patient presents with  Headache 33 y/o F with past medical history of UTIs, pyelonephritis, anemia presents to [facility] ED due to fever, chills, headache, back pain, and foul-smelling urine.  Patient was admitted for suspected pyelonephritis on 7/22 during pregnancy with twins.  On 7/23, she had twins via C-section.  She was discharged from the hospital on 7/28.  Patient did have mastitis in postpartum that improved with antibiotics about 2 weeks ago per patient.  This morning, patient started having fever, chills, headache, and back pain.  Patient thought her urine smelled foul.  Patient states she had a brief period of right chest pain and hyperventilation earlier today, but she denies chest pain and shortness of breath currently.  Patient associates nausea.  Last BM 4 days ago, but patient is passing gas.  Patient denies cough, h/o PE/DVT, recent travel, lower extremity edema or pain, neck pain or stiffness, vision changes, abdominal pain, vomiting, dysuria, hematuria, diarrhea.  History provided by:  PatientLanguage interpreter used: No  Headache: - Neck stiffness: absent - Persistent vomiting: absent - Syncope: absent - Maximum severity at onset: no - Onset during exertion: absentMedical/Surgical/Family History Past Medical History[1] Patient Active Problem List Diagnosis Code  Dichorionic diamniotic twin pregnancy in first trimester O30.041  May be eligible for HPV vaccination PPM Z23  Fall W19.XXXA  Anemia in pregnancy, second trimester O99.012  Elevated glucose tolerance test R73.09  H/O sickle cell trait Z86.2  AMA (advanced maternal age) multigravida 35+ O09.529  Abdominal pain affecting pregnancy O26.899, R10.9  Heartburn during pregnancy O26.899, R12  Pyelonephritis affecting pregnancy O23.00  Pyelonephritis N12  Pelvic pain R10.2  Breech presentation, fetus 1 of multiple gestation O22.1XX1  History of  cesarean section complicating pregnancy O34.219  Pyelonephritis N12  S/P repeat low transverse C-section Z98.891  PPH (postpartum hemorrhage) O72.1  ABLA (acute blood loss anemia) D62  Mastitis N61.0  Past Surgical History[2] Social History[3]  Review of SystemsPhysical Exam Triage VitalsTriage Start: Start, (08/11/24 1813)  First Recorded BP: 141/88, Temp: (S) (!) 40.2 C (104.4 F), Temp src: Oral Oxygen Therapy SpO2: 98 %, O2 Device: None (Room air), Heart Rate: (S) (!) 145, (08/11/24 1816)  .First Pain Reported 0-10  Pain Scale: 10, Pain Location/Orientation: Head;Chest, (08/11/24 1816) Physical ExamVitals and nursing note reviewed. Constitutional:     General: She is not in acute distress.   Appearance: She is not toxic-appearing or diaphoretic. HENT:    Head: Normocephalic and atraumatic. Eyes:    General: No scleral icterus.      Right eye: No discharge.       Left eye: No discharge.    Extraocular Movements: Extraocular movements intact.    Conjunctiva/sclera: Conjunctivae normal.    Pupils: Pupils are equal, round, and reactive to light. Cardiovascular:    Rate and Rhythm: Regular rhythm. Tachycardia present.    Heart sounds: Normal heart sounds. No murmur heard.   No friction rub. No gallop. Pulmonary:    Effort: Pulmonary effort is normal. No respiratory distress.    Breath sounds: Normal breath sounds. No wheezing, rhonchi or rales. Abdominal:    General: Abdomen is flat. A surgical scar is present. Bowel sounds are normal. There is no distension. There are no signs of injury.    Palpations: Abdomen is soft. There is no mass or pulsatile mass.    Tenderness: There is no abdominal tenderness. There is left CVA tenderness (mild). There is no right CVA tenderness, guarding or rebound. Negative signs include Murphy's sign and  McBurney's sign.    Comments: Well healed horizontal C-section scar.  No  dehiscence.  No erythema.   Musculoskeletal:       General: Normal range of motion.    Cervical back: Normal range of motion and neck supple. No rigidity.    Right lower leg: No edema.    Left lower leg: No edema.    Comments: No deep venous system tenderness of BLE.  Skin:   General: Skin is warm and dry.    Findings: No rash. Neurological:    General: No focal deficit present.    Mental Status: She is alert and oriented to person, place, and time.    GCS: GCS eye subscore is 4. GCS verbal subscore is 5. GCS motor subscore is 6.    Cranial Nerves: No cranial nerve deficit, dysarthria or facial asymmetry.    Sensory: No sensory deficit.    Motor: No weakness, tremor or pronator drift.    Coordination: Coordination normal. Finger-Nose-Finger Test and Heel to Shin Test normal. Psychiatric:       Mood and Affect: Mood normal.       Behavior: Behavior normal.       Thought Content: Thought content normal.       Judgment: Judgment normal. Expanded Neurologic ExamCranial Nerves: II: PERRLIII/IV/VI: EOM intact. No nystagmus. Eyelids open equal.V: Facial sensation symmetric to light touch VII: No Facial droop VIII: Hearing intact bilaterallyIX/X: Normal swallowing, normal voiceXI: Equal shoulder shrugXII: Tongue midlineAOx3Speech: NormalStrength intact x 4Sensation intact x 4Pronator drift was absentCoordination: Finger to nose intact Gait: NormalTruncal Stability: able to sit upright Medical Decision Making Patient seen by me on:  8/28/2025Assessment:  33 y/o F with past medical history of UTIs, pyelonephritis, anemia presents to [facility] ED due to fever, chills, headache, back pain, and foul-smelling urine.  Patient was admitted for suspected pyelonephritis on 7/22 during pregnancy with twins.  On 7/23, she had twins via C-section.  She was discharged from the hospital on 7/28.  Patient did have mastitis in  postpartum that improved with antibiotics about 2 weeks ago per patient.  This morning, patient started having fever, chills, headache, and back pain.  Patient thought her urine smelled foul.  Patient states she had a brief period of right chest pain and hyperventilation earlier today, but she denies chest pain and shortness of breath currently.  Patient associates nausea.  Last BM 4 days ago, but patient is passing gas. Differential diagnosis:  UTI/PyelonephritisViral illnessPneumoniaUnlikely intra-abdominal infection, meningitis. Plan:  Orders Placed This Encounter    Blood culture    Blood culture    COVID/Influenza A & B/RSV NAAT (PCR)    UA with reflex to Microscopic UA and Reflex to Bacterial Culture    *Chest standard frontal and lateral views    CBC and differential    Basic metabolic panel    Lactate, plasma    Initiate COVID precautions    Initiate droplet isolation    EKG 12 lead    Insert peripheral IV    acetaminophen  (TYLENOL ) tablet 1,000 mg    sodium chloride  0.9 % bolus 1,000 mL    sodium chloride  0.9 % FLUSH REQUIRED IF PATIENT HAS IV    dextrose  5 % FLUSH REQUIRED IF PATIENT HAS IVEKG Interpretation:  Sinus tachycardia, HR - 135 bpm, no ST elevation, tracing reviewed by myselfIndependent interpretation of imaging: No acute findings on CXR.ED Course and Disposition:  23:10 CXR: No acute findings.  BMP shows CR 1.00.  Baseline CR 0.6-0.7.  Lactate 1.6.  CBC shows WBC 6.1, HCT 32.  Negative COVID, influenza, RSV PCR's.  Urinalysis with 2+ leuks, 11-20 RBCs, 21-50 WBCs, 4+ bacteria.  Concern for UTI.  Patient has history of Klebsiella UTIs that are sensitive to ceftriaxone .  Patient given 1g IV ceftriaxone .  Repeat vital signs showed heart rate improved to 107, fever improved to 38.1 C, and blood pressure decreased to 96/65.  I recommended that patient stay in the hospital for continued IV antibiotics due to concern for urosepsis.   However patient wanted to be discharged to be with her newborn twins.  Patient was willing to get another 1 L IV fluid bolus given 15 mg IV Toradol .  OB came down and evaluated patient.  They would consider admitting to their service, however at the very least OB wants patient to come up to [facility] triage for exam after ED evaluation.  Patient signed out to Dr. Massie Boor awaiting reevaluation after second liter IV fluids, IV Toradol , and IV ceftriaxone .Neuro MDM: Justification for/against neuroimaging: Normal neuro exam.  Symptoms seem to be more infectious in nature.  No meningismus.  Unlikely brain abscess.  Bernardino Hoy Braun, PAAuthor:  Bernardino Hoy [place], [place] [1] Past Medical History:Diagnosis Date  Anemia   History of blood transfusion   per patient with delivery of her daughter in 2009  Migraines   Sickle cell anemia   Sickle cell trait per patient [2] Past Surgical History:Procedure Laterality Date  CESAREAN SECTION, UNSPECIFIED  10/20/2008  COSMETIC SURGERY  2021  abdominoplasty- [place]  PR CESAREAN DELIVERY ONLY W/POSTPARTUM CARE N/A 07/06/2024  Procedure: CESAREAN SECTION;  Surgeon: Fleeta Welford Bradley, MD;  Location: [facility] L&D;  Service: OBGYN [3] Social HistoryTobacco Use  Smoking status: Never   Passive exposure: Never Substance Use Topics  Alcohol  use: Never  Drug use: Never  Braun Bernardino Hoy, PA08/28/25 2343

## 2024-08-11 NOTE — OB Triage Note (Signed)
 OBSTETRICS TRIAGE NOTE  Chief Complaint: pain, fever HPI Madison Rose is a 33 y.o. [phone] presents on PPD 20 from rLTCS with pregnancy complicated by risks outlined below who presents with fever/chills, foul smelling urine. Patient evaluated by ED providers initially due to significant tachycardia to 150s noted by EMS transport. She declined admission for IV abx, was cleared by ED and sent to OBT for OBGYN evaluation given postpartum status. In OBT patient reiterates ED narrative; reports she woke this morning feeling fevers and chills and general malaise. Noted some back/abdominal pain and foul smelling urine. She also began vomiting and felt like her heart was racing. She called EMS for transport and was seen by ED. ED workup concerning for pyelonephritis, patient declined recommendation for admission for IV abx course as she has no childcare, but did receive IV fluids and x1 dose CTX. She is feeling much improved now in terms of her fever/chills and her racing heart. She denies abdominal pain, fundal tenderness, new foul-smelling vaginal discharge or increased bleeding. Stopped breastfeeding about 8 days ago, denies breast pain. Again endorses foul smelling urine. She reiterates that she cannot stay for IV abx but would be open to PO treatment. Pregnancy Risks gHTN (dx PP)- Meds: Nifed 60 - POD1 HELLP labs wnlHx Pyelonephritis - Hx Klebsiella UTI- (7/23) Renal USN: no stone, mild bilateral hydronephrosisABLA 2/2 PPH ~3L - received 1u pRBC during PP course Obstetrical History OB History Gravida Para Term Preterm AB Living 7 2 2  0 5 3 SAB IAB Ectopic Multiple Live Births 0 0 0 1 3  # Outcome Date GA Lbr Len/2nd Weight Sex Type Anes PTL Lv 7A Term 07/06/24 [redacted]w[redacted]d  3200 g (7 lb 0.9 oz) F CSLT SPINAL- N LIV    Birth Comments: None 7B Term 07/06/24 [redacted]w[redacted]d  2930 g (6 lb 7.4 oz) F CSLT SPINAL- N LIV    Birth Comments: None 6 Term 10/20/08 [redacted]w[redacted]d   3610 g (7 lb 15.3 oz) F *CSLT Spinal N LIV    Complications: Fetal Intolerance 5 AB      TAB    4 AB      SAB    3 AB      SAB    2 AB      TAB    1 AB      TAB     Obstetric Comments 12/23/23: No carrier screening results found. Pt states she is a carrier of sickle cell trait. States the FOB is not.  G7 P1051 is correct, does not wish to add dates of prior TABs/SABs PMH, PSH, SH, GYN History, Allergies, and Current Medications reviewed and updated in eRecord. Prenatal Labs Recent Labs Lab 08/28/251900 WBC 6.1 Hemoglobin 10.6* Hematocrit 32* Platelets 269 Recent Labs Lab 08/28/251900 Sodium 140 Potassium 3.7 Chloride 103 CO2 27 UN 9 Creatinine 1.00* Glucose 78  No results for input(s): LD, URIC, ALT, AST, ALK, TB in the last 168 hours. No results for input(s): UTPR, UCRR in the last 168 hours.Urine spot P/C ratio:  Lab results: 06/30/252131 TP Creatinine ratio,UR 0.21     Lab results: 07/24/252001 07/23/250258 06/18/250932 05/28/252331 04/08/251613 03/17/251158 ABO RH Blood Type  --  O RH POS  --   --    < > O RH POS Rubella IgG AB  --   --   --   --   --  0.8 Group B Strep Culture  --   --   --  .  --   --  Syphilis  Screen  --  Neg  --   --    < > Neg HIV 1&2 ANTIGEN/ANTIBODY Nonreactive  --   --   --   --  Nonreactive HBV S Ag NEG  --   --   --   --  NEG N. gonorrhoeae NAAT (PCR)  --   --  NEGATIVE  --    < >  --  Chlamydia NAAT (PCR)  --   --  NEGATIVE  --    < >  --   < > = values in this interval not displayed.    Lab results: 03/17/251158 Glucose,50gm 1HR 144*  No results for input(s): GL0, GL1H, GL2H, GL3H in the last 8760 hours.  Physical Exam Vitals:  08/11/24 1816 08/11/24 2120 08/11/24 2350 08/12/24 0016 BP: 141/88 96/65 104/58 113/62 BP Location:  Left arm Left arm  Pulse: (S) (!) 145 107 102 95 Resp:  24 16 16  Temp: (S) (!)  40.2 C (104.4 F) (!) 38.1 C (100.5 F) 36.2 C (97.2 F) 36.9 C (98.4 F) TempSrc: Oral (S) Oral Temporal Oral SpO2: 98% 98% 100% 98% Weight: 108.4 kg (239 lb)    Height: 1.778 m (5' 10)    General Appearance: healthy, alert, and no distressMental Status: Alert and oriented x 3HEENT: Normocephalic, atraumatic.Cardiovascular: Regular rate and rhythm with no murmursRespiratory: Clear to auscultateAbdomen: soft, non-tender, non-distended. Negative CVA. Fundus at U-2  Breast: Symmetric in appearance, no erythema, drainage, non-tender to palpation Neurological: no focal findingsExtremities: normalSpeculum exam: normal external genitalia. Normal vaginal mucosa, no lesions. Cervix normal in appearance. Normal physiologic discharge present. No bleeding, no purulent discharge noted. Assessment & Plan Madison Rose is a 33 y.o. [id number] at PPD 20 from rLTCS with pregnancy complicated by risks outlined above who presents with fever, malaise and foul-smelling urine. Initial vitals notable for tachycardia to 150s, febrile to 40.2 by EMS report. Initial lab workup completed in ED without leukocytosis, lactate 1.6. URI panel negative, UA with 4+ bacteria, + LE. Ucx and Bcx pending. Given hx of Klebsiella UTI previously sensitive to Ceftriaxone  pt was treated w/ 1g CTX in ED. Additionally administered IV toradol  and 2L IVF for resuscitation with improvement in tachycardia to 107, fever to 38.1.  Upon OBT evaluation patient vitals normal, afebrile, normal HR. Physical exam reassuring for endometritis / mastitis as possible source of infection. Bedside USN without significant intracavitary clot burden.  Speculum exam with normal physiologic discharge, no bleeding or purulent material. Again discussed with patient given hx recurrent UTI and UA findings with previous CVA tenderness on exam in ED and report of foul-smelling urine, current leading etiology is pyelonephritis and would  recommend inpatient IV abx. Patient again declines due to child care responsibilities. Agreeable to PO course. X7d Cefpodoxime  course ordered per ED.  Will send message to [facility] office for follow-up next week. Seen and D/w Dr. Alysia Lauraine Overman, MDOBGYN PGY-2

## 2024-08-11 NOTE — ED Notes (Signed)
 08/11/24 1810 Expected Patient Date of notification 08/11/24 Time Notified 1810 Notified by EMS Notified Via [place] Name of EMS Agency Transporting [facility] ED Service Adult Call-in Pt Info note/Reason for sending 69F, hx 1 month postpartum, delivered 7/23 via c-section, called EMS c/o headache, nausea, recurrent UTI, reportedly recently had mastitis, EKG showing HR in 150's. Also received call from [facility] triage about the pt after EMS called them, caller states they would like the patient seen in the ED first to clear her for cardiac related issues and then they will see her upstairs. Expected Call-In Information Pulse 152 BP 141/88 [id number] Requested Evaluation By Adult ED Report called to EMS triage

## 2024-08-12 ENCOUNTER — Other Ambulatory Visit: Payer: Self-pay

## 2024-08-12 LAB — HOLD GREEN NO GEL

## 2024-08-12 LAB — HOLD SST

## 2024-08-12 LAB — HOLD GRAY

## 2024-08-12 NOTE — OB Triage Note (Signed)
 Pt transferred into triage 20 days PP from ED. Pt endorses VB WNL. AOx 3. Vitals:  08/12/24 0016 BP: 113/62 Pulse: 95 Resp: 16 Temp: 36.9 C (98.4 F) Weight:  Height:  Dr. Warner notified of pt's arrival.Madison Rose Madison Rose, RNC

## 2024-08-12 NOTE — Progress Notes (Signed)
 Pt discharged from unit following consultation with Dr. Alysia. AVS reviewed with pt and pt endorsed understanding. IV removed and pt discharged via ambulation  dressed appropriately for the weather.Allena SHAUNNA Mutters, RNC

## 2024-08-13 LAB — AEROBIC BACTERIAL URINE CULTURE

## 2024-08-16 ENCOUNTER — Other Ambulatory Visit: Payer: Self-pay

## 2024-08-16 ENCOUNTER — Ambulatory Visit: Payer: Self-pay

## 2024-08-16 DIAGNOSIS — N390 Urinary tract infection, site not specified: Secondary | ICD-10-CM

## 2024-08-16 LAB — BLOOD CULTURE
Bacterial Blood Culture: 0
Bacterial Blood Culture: 0

## 2024-08-16 MED ORDER — NITROFURANTOIN MONOHYD MACRO 100 MG PO CAPS *I*
100.0000 mg | ORAL_CAPSULE | Freq: Two times a day (BID) | ORAL | 0 refills | Status: AC
Start: 2024-08-16 — End: 2024-09-11
  Filled 2024-08-16 – 2024-09-06 (×2): qty 10, 5d supply, fill #0

## 2024-08-16 NOTE — Telephone Encounter (Signed)
 Madison Rose was informed that her urine culture was positive for a urinary tract infection.A prescription for macrobid  was sent to her preferred pharmacy on fileShe was instructed to take the full course of antibiotics even if symptoms improve. She would need urgent re-evaluation if she develops fever, chills and back pain.Preventative strategies were reviewed with her.Demonstrated good understanding of concepts, Asked good questions, and Coping well

## 2024-08-17 ENCOUNTER — Ambulatory Visit: Payer: MEDICAID

## 2024-08-17 ENCOUNTER — Telehealth: Payer: Self-pay

## 2024-08-17 NOTE — Progress Notes (Deleted)
 [facility], Obstetrics and GynecologyChief Complaint: Postpartum exam. Subjective: Madison Rose is a 33 y.o. [phone] female who presents for a postpartum exam.  Delivery InformationInformation for the patient's newborn:  Payton, Paris Braylynn [[id number]] Delivery Date: 07/06/2024 7:03 PM Gestational Age: 28w5dPregnancy Complications:None        Type of Delivery: C-sec, low transverse [1000]Delivering Clinician(s): Fleeta Dis, JaneLabor Complications: None [0]Additional Complications: NoneOther Procedures: None  Living Status at Birth: Living [1]Current Living Status: Alive [1] Birthweight: 3200 g (7 lb 0.9 oz)Information for the patient's newborn:  Gabriella Earlean Canny [[id number]] Delivery Date: 07/06/2024 7:04 PM Gestational Age: 14w5dPregnancy Complications:None        Type of Delivery: C-sec, low transverse [1000]Delivering Clinician(s): Fleeta Dis, JaneLabor Complications: None [0]Additional Complications: NoneOther Procedures: None  Living Status at Birth: Living [1]Current Living Status: Alive [1] Birthweight: 2930 g (6 lb 7.4 oz)Hospitalization Summary Concise Narrative: Madison Rose is a 33 y.o. [id number] who was admitted for  suspected pyelonephritis at [redacted]w[redacted]d. On hospital day 2, she was found to have a BPP 2/10 for Twin B, so the decision was made to proceed with delivery via c-section. She received neuraxial anesthesia and underwent repeat low transverse cesarean delivery of two viable female infants, baby A weighing 3200 grams with APGAR's of 8/9 and baby B, weighing 2930 grams with APGAR's of 8/9. Her surgery was complicated by significant keloid scarring over prior abdominoplasty site and extensive scarring subcutaneously. Her procedure was also complicated by PPH of approx 2.5 liters for which she received misoprostol , methergine  and TXA  Please see operative note for full details. Her postpartum course was otherwise  complicated a delayed PPH on POD #1 of 700  ml. She received a unit of pRBC's during her course for stabilization of H/H. Her course was additionally complicated  by a diagnosis of gestational hypertension for which Nifedipine  XL 30 mg daily was initiated and required up titration to Nifed XL 60 mg daily prior to discharge. HELLP labs remained normal. She was discharged home on PPD # 5 in stable condition after meeting all milestones. She will follow up in 1 week for incision check and again in 6 weeks for her routine postpartum visit. She has a zoom follow up with Breastfeeding medicine later this week as well  Significant Med Changes: YesNifedipine XL 60 mg dailyNeurontin 300 mg TID prnLidocaine Patch 4% daily prnOxycodone IR 5 mg every 4-6 hours prnIbuprofen 600 mg every 6 hours prnTylenol 650 mg every 4-6 hours prnMiralax 17 grams daily prnSimethicone 80 mg QID prnShe is now 6 weeks postpartum from a  Payton, Paris Braylynn [[id number]] C-Sec, Low Transverse  Gabriella Earlean Canny [[id number]] C-Sec, Low Transverse Her postpartum course has been  complicated by ED admission on 08/28 for UTI.  Pt received IV antibiotics in the ED prior to d/c to home.   RX for Macrobid  po was provided by [facility] to treat for e coli positive UTI.She reports *** elevated home BP readings. Pt denies headaches, visual changes, epigastric pain, edema, shortness of breath or chest pain. The patient {PP Dk:[id number]}Uyz patient's Allergies, Meds, PMH, PSH, FH and SH were reviewed and updated as appropriate. Objective: There were no vitals taken for this visit.There were no vitals taken for this visit.OB/GYN Physical ExamLMP 10/16/2023 (Exact Date) General appearance: {general exam:16600}Neck: {neck exam:17463::no adenopathy,no carotid bruit,no JVD,supple, symmetrical, trachea midline,thyroid  not enlarged, symmetric, no tenderness/mass/nodules}Lungs: {lung  exam:16931::clear to auscultation bilaterally}Breasts: {breast exam:13139::normal appearance, no masses or tenderness}Heart: {heart exam:5510::regular rate and rhythm, S1, S2 normal, no murmur, click,  rub or gallop}Abdomen: {abdominal exam:16834::soft, non-tender; bowel sounds normal; no masses,  no organomegaly} c/s incision ***Pelvic: {pelvic exam:16852::cervix normal in appearance,external genitalia normal,no adnexal masses or tenderness,no cervical motion tenderness,rectovaginal septum normal,uterus normal size, shape, and consistency,vagina normal without discharge}Extremities: {extremity exam:5109::extremities normal, atraumatic, no cyanosis or edema}**** was present during the breast exam today. Review Flowsheet    02/08/2024 Dates PHQ9 Q9 - Better Off Dead 0  PHQ9 Total Score 3  PHQ9 Severity Level None or Minimal   Details    Patient-reported  Data saved with a previous flowsheet row definition   Her anxiety is minimal (0-4) based a   and this has made it   for her to do her work, take care of things at home, or get along with other people.Assessment/Plan:  Patient is a H2E7946 female s/p  Payton, Paris Braylynn [[id number]] C-Sec, Low Transverse  Gabriella Horns Bryonn [[id number]] C-Sec, Low Transverse here for postpartum visit. She is doing {DESC; WELL/POORLY/MARGINALLY:18601}.Pregnancy risks: History of pyelonephritisHistory of c/s deliveryHistory of abdominoplastyDelayed PPH 2.5 litersGestational hypertension- on nifedpine 60 mg po dailyStatus: {PP [phone]}Infant Feeding: {PP Infant feeding:[phone]::  }Depression Assessment: {PP Depression Plan:[phone]::  }Support: {PP Support:[phone]:: }Contraception: {PP Contraception:[phone]:: }Health Maintenance:  - Reviewed current Pap/HPV screening guidelines- Last pap smear was 09/23/2021 with no  abnormalities.  Pt is not due for a pap smear today. - Offered HPV vaccine today and patient ***.- Schedule a follow up appointment with her PCP to continue manage her BP. - Continue with Nifedipine  60 mg po daily. - Continue with Macrobid  RX to treat UTI. - Attached and reviewed postpartum care, signs and symptoms of postpartum depression, after care for c/s delivery instructions, signs and symptoms of infection, signs and symptoms of pre-eclampsia, safe sex practices reviewed and encouraged, [facility] contact information.  Pt to notify [facility] with any signs or symptoms of infection, signs or symptoms of pre-eclampsia, elevated home BP greater than 140/90, signs and symptoms of postpartum depression or concerns.  RTC: 09/2024 for AGY with CCP or prn

## 2024-08-17 NOTE — Telephone Encounter (Signed)
 Writer called and left voicemail offering to reschedule PPM no showed appt.  A Mychart message will be sent to the patient.

## 2024-08-21 LAB — EKG 12-LEAD
P: 50 deg
PR: 129 ms
QRS: 27 deg
QRSD: 79 ms
QT: 313 ms
QTc: 470 ms
Rate: 135 {beats}/min
T: 27 deg

## 2024-08-22 ENCOUNTER — Other Ambulatory Visit: Payer: Self-pay

## 2024-08-26 ENCOUNTER — Other Ambulatory Visit: Payer: Self-pay

## 2024-09-06 ENCOUNTER — Other Ambulatory Visit: Payer: Self-pay

## 2024-09-06 DIAGNOSIS — N390 Urinary tract infection, site not specified: Secondary | ICD-10-CM

## 2024-09-19 ENCOUNTER — Ambulatory Visit (HOSPITAL_COMMUNITY)

## 2024-09-21 ENCOUNTER — Ambulatory Visit (HOSPITAL_COMMUNITY)

## 2024-09-23 ENCOUNTER — Ambulatory Visit (HOSPITAL_COMMUNITY)

## 2024-09-26 ENCOUNTER — Ambulatory Visit (HOSPITAL_COMMUNITY)
Admission: EM | Admit: 2024-09-26 | Discharge: 2024-09-26 | Disposition: A | Discharge status: Home / Self Care | Attending: Physician Assistant | Admitting: Physician Assistant

## 2024-09-26 ENCOUNTER — Encounter (HOSPITAL_COMMUNITY): Payer: Self-pay | Admitting: *Deleted

## 2024-09-26 ENCOUNTER — Other Ambulatory Visit: Payer: Self-pay

## 2024-09-26 ENCOUNTER — Ambulatory Visit (HOSPITAL_COMMUNITY)

## 2024-09-26 DIAGNOSIS — N3001 Acute cystitis with hematuria: Secondary | ICD-10-CM | POA: Diagnosis not present

## 2024-09-26 DIAGNOSIS — N898 Other specified noninflammatory disorders of vagina: Secondary | ICD-10-CM | POA: Insufficient documentation

## 2024-09-26 LAB — POCT URINE PREGNANCY: Preg Test, Ur: NEGATIVE

## 2024-09-26 LAB — POCT URINALYSIS DIP (MANUAL ENTRY)
Bilirubin, UA: NEGATIVE
Glucose, UA: NEGATIVE mg/dL
Ketones, POC UA: NEGATIVE mg/dL
Nitrite, UA: NEGATIVE
Spec Grav, UA: 1.025 (ref 1.010–1.025)
Urobilinogen, UA: 1 U/dL
pH, UA: 6 (ref 5.0–8.0)

## 2024-09-26 MED ORDER — NITROFURANTOIN MONOHYD MACRO 100 MG PO CAPS: 100.0000 mg | ORAL_CAPSULE | Freq: Two times a day (BID) | ORAL | 0 refills | Status: AC

## 2024-09-26 MED ORDER — FLUCONAZOLE 150 MG PO TABS: 150.0000 mg | ORAL_TABLET | ORAL | 0 refills | Status: AC

## 2024-09-26 NOTE — Discharge Instructions (Addendum)
 I am concerned that you might have a urinary tract infection.  Start Macrobid twice a day for 5 days.  It is also possible your symptoms are related to yeast infection particularly as your Farxiga  has this as a common side effect.  I have sent in Diflucan  to be taken every 3 days up to 3 doses.  You do not have to take all of the doses if your symptoms go away.  We will contact you if your swab or urine culture are abnormal and change our treatment plan.  If you develop any pelvic pain, abdominal pain, fever, nausea, vomiting you should be seen immediately.

## 2024-09-26 NOTE — ED Provider Notes (Signed)
 [facility]    CSN: [id number] Arrival date & time: 09/26/24  9051      History   Chief Complaint Chief Complaint  Patient presents with   Vaginal Itching   Dysuria   Urinary Frequency    HPI Mable Dara is a 33 y.o. female.   Patient presents today with a several week history of vaginal irritation which she describes as burning and itching.  She assumed this was a vaginal yeast infection so started using over-the-counter creams which provided minimal improvement of symptoms.  Over the past few days she has also developed dysuria, frequency, urgency as well as ongoing vaginal irritation.  She denies any fever, nausea, vomiting, abdominal pain, pelvic pain, genital lesions.  She has no specific concern for STI as she has not been sexually active in several years but is open to testing.  She denies any history of diabetes but does take SGLT2 inhibitor for heart failure.  She denies any recent antibiotic use or medication changes.  She is confident that she is not pregnant as she has not been sexually active in her LMP 08/30/2024.    Past Medical History:  Diagnosis Date   CHF (congestive heart failure) (HCC)    Pre-eclampsia     Patient Active Problem List   Diagnosis Date Noted   Hypertensive disorder 05/21/2023   Cardiomyopathy (HCC) 09/19/2020   Class 1 obesity 09/19/2020   CHF (congestive heart failure) (HCC) 10/04/2019   Pregnancy 02/16/2019    History reviewed. No pertinent surgical history.  OB History     Gravida  1   Para  1   Term  1   Preterm  0   AB  0   Living  1      SAB  0   IAB  0   Ectopic  0   Multiple  0   Live Births  1            Home Medications    Prior to Admission medications   Medication Sig Start Date End Date Taking? Authorizing Provider  carvedilol  (COREG ) 25 MG tablet Take 1 tablet (25 mg total) by mouth 2 (two) times daily with a meal. 10/06/23  Yes Rolan Ezra RAMAN, MD  dapagliflozin  propanediol  (FARXIGA ) 10 MG TABS tablet Take 1 tablet (10 mg total) by mouth daily before breakfast. 10/06/23  Yes Rolan Ezra RAMAN, MD  fluconazole  (DIFLUCAN ) 150 MG tablet Take 1 tablet (150 mg total) by mouth every 3 (three) days for 3 doses. 09/26/24 10/03/24 Yes Zakhi Dupre K, PA-C  furosemide  (LASIX ) 20 MG tablet Take 1 tablet (20 mg total) by mouth as needed (3lb weight gain overnight or 5lb weightgain in a week). 09/17/21 10/05/24 Yes Milford, Harlene HERO, FNP  nitrofurantoin, macrocrystal-monohydrate, (MACROBID) 100 MG capsule Take 1 capsule (100 mg total) by mouth 2 (two) times daily. 09/26/24  Yes Gala Padovano K, PA-C  sacubitril -valsartan  (ENTRESTO ) 97-103 MG Take 1 tablet by mouth 2 (two) times daily. 01/15/24  Yes Bensimhon, Toribio SAUNDERS, MD  spironolactone  (ALDACTONE ) 25 MG tablet Take 2 tablets (50 mg total) by mouth daily. 10/06/23  Yes Rolan Ezra RAMAN, MD    Family History Family History  Problem Relation Age of Onset   Hypertension Mother    Heart failure Father    Hypertension Sister    Hypertension Sister    Hypertension Sister     Social History Social History   Tobacco Use   Smoking status: Never  Smokeless tobacco: Never  Vaping Use   Vaping status: Never Used  Substance Use Topics   Alcohol use: Not Currently   Drug use: Not Currently     Allergies   Penicillins and Cleocin  [clindamycin ]   Review of Systems Review of Systems  Constitutional:  Positive for activity change. Negative for appetite change, fatigue and fever.  Gastrointestinal:  Negative for abdominal pain, diarrhea, nausea and vomiting.  Genitourinary:  Positive for dysuria, frequency, urgency, vaginal discharge and vaginal pain (irritation). Negative for pelvic pain and vaginal bleeding.  Musculoskeletal:  Negative for arthralgias, back pain and myalgias.     Physical Exam Triage Vital Signs ED Triage Vitals  Encounter Vitals Group     BP 09/26/24 1110 (!) 146/105     Girls Systolic BP Percentile  --      Girls Diastolic BP Percentile --      Boys Systolic BP Percentile --      Boys Diastolic BP Percentile --      Pulse Rate 09/26/24 1110 90     Resp 09/26/24 1110 16     Temp 09/26/24 1110 98.4 F (36.9 C)     Temp src --      SpO2 09/26/24 1110 97 %     Weight --      Height --      Head Circumference --      Peak Flow --      Pain Score 09/26/24 1105 7     Pain Loc --      Pain Education --      Exclude from Growth Chart --    No data found.  Updated Vital Signs BP (!) 146/105   Pulse 90   Temp 98.4 F (36.9 C)   Resp 16   LMP 08/30/2024 (Approximate)   SpO2 97%   Visual Acuity Right Eye Distance:   Left Eye Distance:   Bilateral Distance:    Right Eye Near:   Left Eye Near:    Bilateral Near:     Physical Exam Vitals reviewed.  Constitutional:      General: She is awake. She is not in acute distress.    Appearance: Normal appearance. She is well-developed. She is not ill-appearing.     Comments: Very pleasant female appears stated age in no acute distress   HENT:     Head: Normocephalic and atraumatic.  Cardiovascular:     Rate and Rhythm: Normal rate and regular rhythm.     Heart sounds: Normal heart sounds, S1 normal and S2 normal. No murmur heard. Pulmonary:     Effort: Pulmonary effort is normal.     Breath sounds: Normal breath sounds. No wheezing, rhonchi or rales.     Comments: Clear to auscultation bilaterally  Abdominal:     General: Bowel sounds are normal.     Palpations: Abdomen is soft.     Tenderness: There is no abdominal tenderness. There is no right CVA tenderness, left CVA tenderness, guarding or rebound.     Comments: Benign abdominal exam, no tenderness to palpation.   Psychiatric:        Behavior: Behavior is cooperative.      UC Treatments / Results  Labs (all labs ordered are listed, but only abnormal results are displayed) Labs Reviewed  POCT URINALYSIS DIP (MANUAL ENTRY) - Abnormal; Notable for the following  components:      Result Value   Clarity, UA cloudy (*)    Blood, UA moderate (*)  Protein Ur, POC trace (*)    Leukocytes, UA Small (1+) (*)    All other components within normal limits  URINE CULTURE  POCT URINE PREGNANCY  CERVICOVAGINAL ANCILLARY ONLY    EKG   Radiology No results found.  Procedures Procedures (including critical care time)  Medications Ordered in UC Medications - No data to display  Initial Impression / Assessment and Plan / UC Course  I have reviewed the triage vital signs and the nursing notes.  Pertinent labs & imaging results that were available during my care of the patient were reviewed by me and considered in my medical decision making (see chart for details).     Patient is well-appearing, afebrile, nontoxic, nontachycardic.  Vital signs physical exam are reassuring with no indication for emergent evaluation or imaging.  UA did have trace leukocytes and blood.  We discussed that this could be related to a significant yeast infection but given she is having associated dysuria, frequency, urgency will cover for UTI with Macrobid twice daily for 5 days.  No indication for dose adjustment based on metabolic panel from 10/06/2023 with a creatinine of 0.86 and calculated creatinine clearance of 138 mm/min.  Will send for culture and contact her if we need to stop or change her antibiotics based on culture results.  Recommended she rest and drink plenty fluid.  Will treat for concurrent yeast infection given her associated symptoms and high risk given she is taking initially 2 inhibitor.  Fluconazole  150 mg to be taken every 3 days for up to 3 doses was sent to pharmacy.  Vaginal swab was obtained and is pending.  We will contact her if we need to arrange additional treatment based on the swab results.  We discussed that if she has any worsening or changing symptoms she needs to be seen immediately including abdominal pain, pelvic pain, fever, nausea, vomiting.   She return precautions given.  Excuse note provided.  Final Clinical Impressions(s) / UC Diagnoses   Final diagnoses:  Acute cystitis with hematuria  Vaginal irritation  Vaginal discharge     Discharge Instructions      I am concerned that you might have a urinary tract infection.  Start Macrobid twice a day for 5 days.  It is also possible your symptoms are related to yeast infection particularly as your Farxiga  has this as a common side effect.  I have sent in Diflucan  to be taken every 3 days up to 3 doses.  You do not have to take all of the doses if your symptoms go away.  We will contact you if your swab or urine culture are abnormal and change our treatment plan.  If you develop any pelvic pain, abdominal pain, fever, nausea, vomiting you should be seen immediately.     ED Prescriptions     Medication Sig Dispense Auth. Provider   nitrofurantoin, macrocrystal-monohydrate, (MACROBID) 100 MG capsule Take 1 capsule (100 mg total) by mouth 2 (two) times daily. 10 capsule Clytie Shetley K, PA-C   fluconazole  (DIFLUCAN ) 150 MG tablet Take 1 tablet (150 mg total) by mouth every 3 (three) days for 3 doses. 3 tablet Flynt Breeze K, PA-C      PDMP not reviewed this encounter.   Sherrell Rocky POUR, PA-C 09/26/24 1146

## 2024-09-26 NOTE — ED Triage Notes (Signed)
 PT reports for 3 weeks she has had dysuria,urinary frequency and vag.itching. Pt has been using OTC creams with out results.

## 2024-09-27 ENCOUNTER — Ambulatory Visit (HOSPITAL_COMMUNITY): Payer: Self-pay

## 2024-09-27 LAB — CERVICOVAGINAL ANCILLARY ONLY
Bacterial Vaginitis (gardnerella): POSITIVE — AB
Candida Glabrata: NEGATIVE
Candida Vaginitis: NEGATIVE
Chlamydia: NEGATIVE
Comment: NEGATIVE
Comment: NEGATIVE
Comment: NEGATIVE
Comment: NEGATIVE
Comment: NEGATIVE
Comment: NORMAL
Neisseria Gonorrhea: NEGATIVE
Trichomonas: POSITIVE — AB

## 2024-09-27 LAB — URINE CULTURE: Culture: <10000 — AB

## 2024-09-27 MED ORDER — METRONIDAZOLE 500 MG PO TABS: 500.0000 mg | ORAL_TABLET | Freq: Two times a day (BID) | ORAL | 0 refills | Status: AC

## 2024-09-28 ENCOUNTER — Emergency Department (HOSPITAL_COMMUNITY)
Admission: EM | Admit: 2024-09-28 | Discharge: 2024-09-28 | Disposition: A | Discharge status: Home / Self Care | Attending: Emergency Medicine | Admitting: Emergency Medicine

## 2024-09-28 ENCOUNTER — Encounter (HOSPITAL_COMMUNITY): Payer: Self-pay | Admitting: Emergency Medicine

## 2024-09-28 ENCOUNTER — Other Ambulatory Visit: Payer: Self-pay

## 2024-09-28 DIAGNOSIS — M5442 Lumbago with sciatica, left side: Secondary | ICD-10-CM | POA: Insufficient documentation

## 2024-09-28 DIAGNOSIS — M545 Low back pain, unspecified: Secondary | ICD-10-CM | POA: Diagnosis present

## 2024-09-28 DIAGNOSIS — M5432 Sciatica, left side: Secondary | ICD-10-CM

## 2024-09-28 MED ORDER — METHYLPREDNISOLONE SODIUM SUCC 125 MG IJ SOLR
125.0000 mg | Freq: Once | INTRAMUSCULAR | Status: AC
  Administered 2024-09-28: 125 mg via INTRAVENOUS
  Filled 2024-09-28: qty 2

## 2024-09-28 MED ORDER — KETOROLAC TROMETHAMINE 15 MG/ML IJ SOLN
15.0000 mg | Freq: Once | INTRAMUSCULAR | Status: AC
  Administered 2024-09-28: 15 mg via INTRAVENOUS
  Filled 2024-09-28: qty 1

## 2024-09-28 MED ORDER — NAPROXEN 500 MG PO TABS: 500.0000 mg | ORAL_TABLET | Freq: Two times a day (BID) | ORAL | 0 refills | Status: AC

## 2024-09-28 MED ORDER — METHOCARBAMOL 500 MG PO TABS
500.0000 mg | ORAL_TABLET | Freq: Two times a day (BID) | ORAL | 0 refills | Status: AC
Start: 2024-09-28 — End: ?

## 2024-09-28 NOTE — ED Notes (Signed)
Patient verbalizes understanding of discharge instructions. Opportunity for questioning and answers were provided. Armband removed by staff, pt discharged from ED. Wheeled out to lobby, awaiting ride home

## 2024-09-28 NOTE — ED Triage Notes (Signed)
 Pt in with L low back pain that began while at work yesterday around 1pm. Pain radiates down entire L Leg, no numbness/tingling endorsed. Pt describes pain as spasms

## 2024-09-28 NOTE — ED Provider Triage Note (Signed)
 Emergency Medicine Provider Triage Evaluation Note  Robin Gallegos , a 33 y.o. female  was evaluated in triage.  Pt complains of left lower back pain, onset at 1pm yesterday while walking to cookout for lunch. Pain radiates to left lower thigh. Sat down for lunch and when she stood up to leave, pain was worse. No falls or injuries. Took IBU and muscle relaxant without any relief.  No possibility of pregnancy.   Review of Systems  Positive:  Negative: Fever, abdominal pain, history of IVDA, saddle paresthesia, loss of bowel or bladder control.  Physical Exam  BP (!) 164/110 (BP Location: Left Arm)   Pulse 97   Temp 98.1 F (36.7 C)   Resp 18   Wt 107 kg   LMP 08/30/2024 (Approximate)   SpO2 98%   BMI 40.49 kg/m  Gen:   Awake, no distress   Resp:  Normal effort  MSK:   Moves extremities without difficulty  Other:    Medical Decision Making  Medically screening exam initiated at 2:23 AM.  Appropriate orders placed.  Edlin Ford was informed that the remainder of the evaluation will be completed by another provider, this initial triage assessment does not replace that evaluation, and the importance of remaining in the ED until their evaluation is complete.     Beverley Leita LABOR, PA-C 09/28/24 [phone]

## 2024-09-28 NOTE — Discharge Instructions (Addendum)
 Home to rest. Apply warm compress for 20 minutes, follow with gentle stretching.  Apply topical lidocaine  patch to area. Take Robaxin  as prescribed, do not drive if taking. Take Naproxen  as prescribed.  Follow up with primary care provider as scheduled.

## 2024-09-28 NOTE — ED Provider Notes (Signed)
 [place] [facility] Provider Note   CSN: [id number] Arrival date & time: 09/28/24  9846     Patient presents with: No chief complaint on file.   Robin Gallegos is a 33 y.o. female.   33 y.o. female  was evaluated in triage.  Pt complains of left lower back pain, onset at 1pm yesterday while walking to cookout for lunch. Pain radiates to left lower thigh. Sat down for lunch and when she stood up to leave, pain was worse. No falls or injuries. Took IBU and muscle relaxant without any relief.  No possibility of pregnancy.         Prior to Admission medications   Medication Sig Start Date End Date Taking? Authorizing Provider  methocarbamol  (ROBAXIN ) 500 MG tablet Take 1 tablet (500 mg total) by mouth 2 (two) times daily. 09/28/24  Yes Beverley Leita LABOR, PA-C  naproxen  (NAPROSYN ) 500 MG tablet Take 1 tablet (500 mg total) by mouth 2 (two) times daily. 09/28/24  Yes Beverley Leita LABOR, PA-C  carvedilol  (COREG ) 25 MG tablet Take 1 tablet (25 mg total) by mouth 2 (two) times daily with a meal. 10/06/23   Rolan Ezra RAMAN, MD  dapagliflozin  propanediol (FARXIGA ) 10 MG TABS tablet Take 1 tablet (10 mg total) by mouth daily before breakfast. 10/06/23   Rolan Ezra RAMAN, MD  fluconazole  (DIFLUCAN ) 150 MG tablet Take 1 tablet (150 mg total) by mouth every 3 (three) days for 3 doses. 09/26/24 10/03/24  Raspet, Erin K, PA-C  furosemide  (LASIX ) 20 MG tablet Take 1 tablet (20 mg total) by mouth as needed (3lb weight gain overnight or 5lb weightgain in a week). 09/17/21 10/05/24  Glena Harlene HERO, FNP  metroNIDAZOLE (FLAGYL) 500 MG tablet Take 1 tablet (500 mg total) by mouth 2 (two) times daily for 7 days. 09/27/24 10/04/24  Banister, Pamela K, MD  nitrofurantoin, macrocrystal-monohydrate, (MACROBID) 100 MG capsule Take 1 capsule (100 mg total) by mouth 2 (two) times daily. 09/26/24   Raspet, Erin K, PA-C  sacubitril -valsartan  (ENTRESTO ) 97-103 MG Take 1 tablet by  mouth 2 (two) times daily. 01/15/24   Bensimhon, Toribio SAUNDERS, MD  spironolactone  (ALDACTONE ) 25 MG tablet Take 2 tablets (50 mg total) by mouth daily. 10/06/23   Rolan Ezra RAMAN, MD    Allergies: Penicillins and Cleocin  Catha ]    Review of Systems Negative except as per HPI Updated Vital Signs BP (!) 164/110 (BP Location: Left Arm)   Pulse 97   Temp 98.1 F (36.7 C)   Resp 18   Wt 107 kg   LMP 08/30/2024 (Approximate)   SpO2 98%   BMI 40.49 kg/m   Physical Exam Vitals and nursing note reviewed.  Constitutional:      General: She is not in acute distress.    Appearance: She is well-developed. She is not diaphoretic.  HENT:     Head: Normocephalic and atraumatic.  Cardiovascular:     Pulses: Normal pulses.  Pulmonary:     Effort: Pulmonary effort is normal.  Abdominal:     Palpations: Abdomen is soft.     Tenderness: There is no abdominal tenderness.  Musculoskeletal:        General: Tenderness present. No swelling, deformity or signs of injury.     Lumbar back: Tenderness present. No bony tenderness. Positive left straight leg raise test. Negative right straight leg raise test.       Back:     Right lower leg: No edema.  Left lower leg: No edema.     Comments: Tenderness palpation over left SI.   Skin:    General: Skin is warm and dry.     Findings: No erythema or rash.  Neurological:     Mental Status: She is alert and oriented to person, place, and time.     Sensory: No sensory deficit.     Motor: No weakness.     Deep Tendon Reflexes:     Reflex Scores:      Patellar reflexes are 1+ on the right side and 1+ on the left side. Psychiatric:        Behavior: Behavior normal.     (all labs ordered are listed, but only abnormal results are displayed) Labs Reviewed - No data to display  EKG: None  Radiology: No results found.   Procedures   Medications Ordered in the ED  ketorolac  (TORADOL ) 15 MG/ML injection 15 mg (15 mg Intravenous Given  09/28/24 0244)  methylPREDNISolone  sodium succinate (SOLU-MEDROL ) 125 mg/2 mL injection 125 mg (125 mg Intravenous Given 09/28/24 0244)                                    Medical Decision Making Risk Prescription drug management.   33 year old female presents with complaint of pain in her left lower back which radiates down the left lower leg.  No red flags.  Pain with palpation of the left SI joint and straight leg raise on left.  Suspect sciatica.  Provided with IM Toradol  and Solu-Medrol .  Discharged with prescription for Robaxin  and naproxen .  Recommend warm compresses to area for 20 minutes at a time, followed with gentle stretching.  Can also apply Lidoderm  patch.  Follow-up with PCP for recheck as scheduled in the morning.  Return to ER for worsening or concerning symptoms.     Final diagnoses:  Sciatica of left side    ED Discharge Orders          Ordered    methocarbamol  (ROBAXIN ) 500 MG tablet  2 times daily        09/28/24 0230    naproxen  (NAPROSYN ) 500 MG tablet  2 times daily        09/28/24 0230               Beverley Leita LABOR, PA-C 09/28/24 0250    Theadore Ozell HERO, MD 09/28/24 [phone]

## 2024-09-30 ENCOUNTER — Other Ambulatory Visit: Payer: Self-pay

## 2024-09-30 ENCOUNTER — Emergency Department (HOSPITAL_COMMUNITY)

## 2024-09-30 ENCOUNTER — Emergency Department (HOSPITAL_COMMUNITY)
Admission: EM | Admit: 2024-09-30 | Discharge: 2024-09-30 | Disposition: A | Discharge status: Home / Self Care | Attending: Emergency Medicine | Admitting: Emergency Medicine

## 2024-09-30 ENCOUNTER — Encounter (HOSPITAL_COMMUNITY): Payer: Self-pay

## 2024-09-30 DIAGNOSIS — M5442 Lumbago with sciatica, left side: Secondary | ICD-10-CM | POA: Diagnosis not present

## 2024-09-30 DIAGNOSIS — M545 Low back pain, unspecified: Secondary | ICD-10-CM | POA: Diagnosis present

## 2024-09-30 DIAGNOSIS — I509 Heart failure, unspecified: Secondary | ICD-10-CM | POA: Diagnosis not present

## 2024-09-30 DIAGNOSIS — M5432 Sciatica, left side: Secondary | ICD-10-CM

## 2024-09-30 LAB — BRAIN NATRIURETIC PEPTIDE: B Natriuretic Peptide: 74.9 pg/mL (ref 0.0–100.0)

## 2024-09-30 LAB — TROPONIN I (HIGH SENSITIVITY): Troponin I (High Sensitivity): 9 ng/L (ref <18)

## 2024-09-30 MED ORDER — PREDNISONE 20 MG PO TABS
60.0000 mg | ORAL_TABLET | Freq: Once | ORAL | Status: AC
  Administered 2024-09-30: 60 mg via ORAL
  Filled 2024-09-30: qty 3

## 2024-09-30 MED ORDER — OXYCODONE-ACETAMINOPHEN 5-325 MG PO TABS
1.0000 | ORAL_TABLET | Freq: Once | ORAL | Status: AC
  Administered 2024-09-30: 1 via ORAL
  Filled 2024-09-30: qty 1

## 2024-09-30 NOTE — ED Triage Notes (Signed)
 Patient reports persistent pain at lower back radiating down to left leg unrelieved Rx medications prescribed here 2 days ago for the same pain .

## 2024-09-30 NOTE — Discharge Instructions (Signed)
 You were evaluated in the Emergency Department and after careful evaluation, we did not find any emergent condition requiring admission or further testing in the hospital.  Your exam/testing today is overall reassuring.  Recommend use of the prednisone  steroid medication daily as we discussed.  Take your first home dose tomorrow morning.  Can continue the Robaxin  as needed at home.  Please return to the Emergency Department if you experience any worsening of your condition.   Thank you for allowing us  to be a part of your care.

## 2024-09-30 NOTE — ED Provider Notes (Signed)
 [facility]-EMERGENCY DEPT [facility] Emergency Department Provider Note MRN:  [id number]  Arrival date & time: 09/30/24     Chief Complaint   Back Pain (Chest Tightness)   History of Present Illness   Robin Gallegos is a 33 y.o. year-old female with a history of CHF presenting to the ED with chief complaint of back pain.  Left lower back pain with radiation down the back of the entire left leg bothering her for the past several days.  Was prescribed Naprosyn  and Robaxin , not really helping.  Denies numbness or weakness to the arms or legs, no bowel or bladder dysfunction.  Had a brief episode of chest pain while in the waiting room lasting only a few moments and then went away.  Review of Systems  A thorough review of systems was obtained and all systems are negative except as noted in the HPI and PMH.   Patient's Health History    Past Medical History:  Diagnosis Date   CHF (congestive heart failure) (HCC)    Pre-eclampsia     History reviewed. No pertinent surgical history.  Family History  Problem Relation Age of Onset   Hypertension Mother    Heart failure Father    Hypertension Sister    Hypertension Sister    Hypertension Sister     Social History   Socioeconomic History   Marital status: Single    Spouse name: Not on file   Number of children: Not on file   Years of education: Not on file   Highest education level: Not on file  Occupational History   Not on file  Tobacco Use   Smoking status: Never   Smokeless tobacco: Never  Vaping Use   Vaping status: Never Used  Substance and Sexual Activity   Alcohol use: Not Currently   Drug use: Not Currently   Sexual activity: Yes  Other Topics Concern   Not on file  Social History Narrative   Not on file   Social Drivers of Health   Financial Resource Strain: Not on file  Food Insecurity: Not on file  Transportation Needs: Not on file  Physical Activity: Not on file  Stress: Not on file  Social  Connections: Not on file  Intimate Partner Violence: Not on file     Physical Exam   Vitals:   09/30/24 0235  BP: (!) 172/127  Pulse: 98  Resp: 17  Temp: 98.2 F (36.8 C)  SpO2: 99%    CONSTITUTIONAL: Well-appearing, NAD NEURO/PSYCH:  Alert and oriented x 3, no focal deficits EYES:  eyes equal and reactive ENT/NECK:  no LAD, no JVD CARDIO: Regular rate, well-perfused, normal S1 and S2 PULM:  CTAB no wheezing or rhonchi GI/GU:  non-distended, non-tender MSK/SPINE:  No gross deformities, no edema SKIN:  no rash, atraumatic   *Additional and/or pertinent findings included in MDM below  Diagnostic and Interventional Summary    EKG Interpretation Date/Time:    Ventricular Rate:    PR Interval:    QRS Duration:    QT Interval:    QTC Calculation:   R Axis:      Text Interpretation:         Labs Reviewed  BRAIN NATRIURETIC PEPTIDE  TROPONIN I (HIGH SENSITIVITY)    DG Chest 2 View  Final Result      Medications  predniSONE  (DELTASONE ) tablet 60 mg (has no administration in time range)  oxyCODONE -acetaminophen  (PERCOCET/ROXICET) 5-325 MG per tablet 1 tablet (1 tablet Oral Given 09/30/24 0242)  Procedures  /  Critical Care Procedures  ED Course and Medical Decision Making  Initial Impression and Ddx Patient here for continued sciatica despite medications at home.  She has no red flag symptoms to suggest myelopathy, no trauma to warrant imaging.  Also had a brief episode of chest discomfort associated with needing to go have a bowel movement, very brief, favored noncardiac.  Past medical/surgical history that increases complexity of ED encounter: Reported history of CHF  Interpretation of Diagnostics I personally reviewed the EKG and my interpretation is as follows: Sinus rhythm without concerning features    Patient Reassessment and Ultimate Disposition/Management     No signs of emergent process, labs are reassuring, troponin negative, appropriate  for discharge.  Patient management required discussion with the following services or consulting groups:  None  Complexity of Problems Addressed Acute complicated illness or Injury  Additional Data Reviewed and Analyzed Further history obtained from: None  Additional Factors Impacting ED Encounter Risk Prescriptions  Ozell HERO. Theadore, MD [facility] Emergency Medicine [facility] [email or web address]  Final Clinical Impressions(s) / ED Diagnoses     ICD-10-CM   1. Sciatica of left side  M54.32       ED Discharge Orders     None        Discharge Instructions Discussed with and Provided to Patient:    Discharge Instructions      You were evaluated in the Emergency Department and after careful evaluation, we did not find any emergent condition requiring admission or further testing in the hospital.  Your exam/testing today is overall reassuring.  Recommend use of the prednisone  steroid medication daily as we discussed.  Take your first home dose tomorrow morning.  Can continue the Robaxin  as needed at home.  Please return to the Emergency Department if you experience any worsening of your condition.   Thank you for allowing us  to be a part of your care.      Theadore Ozell HERO, MD 09/30/24 [phone]

## 2024-09-30 NOTE — ED Notes (Addendum)
 Patient reports chest tightness with SOB while seating at waiting area , EKG/chest x-ray/blood test ordered .

## 2024-12-05 ENCOUNTER — Other Ambulatory Visit: Payer: Self-pay

## 2024-12-05 MED ORDER — CLASSIC PRENATAL 28-0.8 MG PO TABS *I*
ORAL_TABLET | ORAL | 3 refills | Status: AC
  Filled 2024-12-05: qty 30, 30d supply, fill #0

## 2024-12-06 ENCOUNTER — Other Ambulatory Visit: Payer: Self-pay

## 2024-12-16 ENCOUNTER — Other Ambulatory Visit: Payer: Self-pay

## 2024-12-21 ENCOUNTER — Telehealth: Payer: Self-pay

## 2024-12-21 NOTE — Telephone Encounter (Signed)
 TOP Appointment RequestDate of LMP: around the end of 10/2025Do you have insurance: yes  If yes, Type of Insurance: [employer] ID#: [id number] SS# (if Medicaid plan): [id number] you had an Ultrasound?  yesif yes WHERE? [facility]Please provide [facility] # [phone] to have copy faxed if not in e-record.What is the best phone number for a call back: [phone] it OK to leave a voicemail: yesIf the patient does NOT have medicaid please send to OBGYN Prior AuthPLEASE TRANSFER ALL NEW CALLS TO RIM       PLEASE ROUTE ALL [facility] CALLS TO THE [facility] STAFF POOL  FYI: [facility] fax over referral yesterday 12/20/24

## 2024-12-22 NOTE — Telephone Encounter (Signed)
 Writer contacted patient and advised of Medicaid not active. Patient states she should have active [employer], Sending to PA team to for verification and will contact her once they respond.

## 2024-12-22 NOTE — Telephone Encounter (Signed)
 At this time pt does not have an active [employer] policy

## 2024-12-22 NOTE — Telephone Encounter (Deleted)
 Pt does not have an active Medicaid policy

## 2024-12-23 NOTE — Telephone Encounter (Signed)
 [facility] patient calling to check on scheduling status. Writer advised, per previous documentation, that the insurance currently on file is not active. Writer inquired whether the patient had her insurance card available so the information could be forwarded to Prior Authorization and the [facility] team. Patient stated she did not have the insurance card at this time and will call back once the information is available.

## 2024-12-26 NOTE — Telephone Encounter (Signed)
 Incoming call from [facility] patient providing updated insurance information. Writer advised that a message would be sent to the [facility] team for update and follow-up. Patient informed she will receive a return call with an update. Mala verbalized understanding.Updated Insurance InformationInsurance: [employer] ShieldInsurance ID: [id number]

## 2024-12-27 NOTE — Telephone Encounter (Signed)
 Per availity, pa not req for cpt S0199 [phone] [id number] and benefits are as followsAfter deductible2250, none has been metOffice $35 copayAll other facilities 20% coinsLimit one per year

## 2024-12-28 ENCOUNTER — Telehealth: Payer: Self-pay

## 2024-12-28 NOTE — Telephone Encounter (Signed)
 LMP - 10/14/2024 Writer contacted patient, Spoke with patient, patient is scheduled for Pre-Op D&E on 01/11/2025 at 930am, procedure on 01/13/2025. Patient is aware of visitor policy,  cervical prep,  a photo ID is required and to have a driver for procedur

## 2024-12-28 NOTE — Telephone Encounter (Signed)
 Date:12/28/24;  Time:12:37AM  Recorded By: SLTOP SERVICETELEPHONE INTAKEPatient's Name: Madison Rose Patient Age: 34 y.o. Patient's Address:  [address] [place] [address]      DOB: [birth year] Pronouns- Current county? [place] and state of BIRTH: [place] Birth last name/maiden name if different than current last name? [place] Last 4 digits of social? 1361Race: Black  Hispanic Origin  NoHighest level of education completed? GED CONTACT INFO:Phone:  (c) number: [phone]   Message OK? Yes            WHP Patient? NoReferring Provider? Insurance: [employer] Ins/Contract #: [id number] Ins Verified: 12/27/2024 LMP:   EGA: [redacted]W[redacted]D on 01/13/2025  No LMP recorded. BMI: 35.2 1st Trimester:  No2nd Trimester:  YesGenetic Referral:  NoOB/GYN History: G 9  P 3OB History   Gravida 7  Para 2  Term 2  Preterm 0  AB 5  Living 3   SAB 0  IAB 0  Ectopic 0  Multiple 1  Live Births 3     Obstetric Comments 12/23/23: No carrier screening results found.Pt states she is a carrier of sickle cell trait. States the FOB is not. G7 P1051 is correct, does not wish to add dates of prior TABs/SABs    Previous Vaginal deliveries? Previous C/S? 3Previous TOP?: 3Previous SAb? 2Date of first live birth:10/2008 Date of last live birth:07/2025Date of last termination:Significant Medical History? None Past Medical History[1] Significant Surgical HxPast Surgical History[2]Current drug use? NoneNot a candidate for procedure room if:  ? BMI greater than 40? Active street drug use? Current suboxone/subutex/methadone useNeeds to be reviewed with provider prior to scheduling if:? Cardiac/Pulmonary issues? Diabetes on insulin? Severe psychiatric disease? BMI greater than 50Allergies: Clindamycin Allergies[3] Current Meds: None Where  was your pregnancy diagnosed? HPT Have you been seen anywhere else for your pregnancy? yes Have you been seen in a hospital, urgent care or emergency room for your pregnancy? noHave you received care at any of the following clinics: [facility] no[facility] noPl[facility] yes[facility] noOther Private Doctor including Family Medicine, Internal Medicine, or Pediatrics [facility] you had an Ultrasound for this pregnancy? noIf so, where? What did they tell you about the Ultrasound? Date of ultrasound:   1st Trimester Appt Scheduled for: 2nd Trimester Appt Scheduled for:  01/28/2026OR Date: 01/13/2025 Reviewed transportation policy: YesReviewed NPO for same day procedures: N/AReviewed dilator placement (GA-16 weeks and over): N/APostop Appt/Location: Will only make if necessary. [1] Past Medical History:Diagnosis Date  Anemia   History of blood transfusion   per patient with delivery of her daughter in 2009  Migraines   Sickle cell anemia   Sickle cell trait per patient [2] Past Surgical History:Procedure Laterality Date  CESAREAN SECTION, UNSPECIFIED  10/20/2008  COSMETIC SURGERY  2021  abdominoplasty- [place]  PR CESAREAN DELIVERY ONLY W/POSTPARTUM CARE N/A 07/06/2024  Procedure: CESAREAN SECTION;  Surgeon: Fleeta Welford Bradley, MD;  Location[facility];  Service: OBGYN [3] AllergiesAllergen Reactions  Ak-Mycin [Erythromycin ] Swelling   All mycins- throat swelling and hard to breath  Elemental Sulfur Hives  Clindamycin Other (See Comments)   unknown  Clindamycin/Lincomycin Other (See Comments) and Hives   unknown

## 2025-01-07 ENCOUNTER — Emergency Department
Admission: EM | Admit: 2025-01-07 | Discharge: 2025-01-07 | Disposition: A | Discharge status: Home / Self Care | Payer: Self-pay | Source: Ambulatory Visit | Attending: Emergency Medicine | Admitting: Emergency Medicine

## 2025-01-07 ENCOUNTER — Other Ambulatory Visit: Payer: Self-pay

## 2025-01-07 DIAGNOSIS — Z3A17 17 weeks gestation of pregnancy: Secondary | ICD-10-CM

## 2025-01-07 DIAGNOSIS — O4690 Antepartum hemorrhage, unspecified, unspecified trimester: Secondary | ICD-10-CM | POA: Insufficient documentation

## 2025-01-07 DIAGNOSIS — Z3A00 Weeks of gestation of pregnancy not specified: Secondary | ICD-10-CM | POA: Insufficient documentation

## 2025-01-07 DIAGNOSIS — O4692 Antepartum hemorrhage, unspecified, second trimester: Secondary | ICD-10-CM

## 2025-01-07 DIAGNOSIS — O30009 Twin pregnancy, unspecified number of placenta and unspecified number of amniotic sacs, unspecified trimester: Secondary | ICD-10-CM | POA: Insufficient documentation

## 2025-01-07 LAB — CBC AND DIFFERENTIAL
Baso # K/uL: 0 10*3/uL (ref 0.0–0.2)
Eos # K/uL: 0.2 10*3/uL (ref 0.0–0.5)
Hematocrit: 31 % — ABNORMAL LOW (ref 34–49)
Hemoglobin: 10.7 g/dL — ABNORMAL LOW (ref 11.2–16.0)
IMM Granulocytes #: 0.2 10*3/uL — ABNORMAL HIGH (ref 0–0)
IMM Granulocytes: 1.7 %
Lymph # K/uL: 1.7 10*3/uL (ref 1.0–5.0)
MCV: 91 fL (ref 75–100)
Mono # K/uL: 0.7 10*3/uL (ref 0.1–1.0)
Neut # K/uL: 6.1 10*3/uL (ref 1.5–6.5)
Nucl RBC # K/uL: 0 10*3/uL (ref 0.0–0.1)
Nucl RBC %: 0 /100{WBCs} (ref 0.0–0.2)
Platelets: 282 10*3/uL (ref 150–450)
RBC: 3.5 MIL/uL — ABNORMAL LOW (ref 4.0–5.5)
RDW: 13.3 % (ref 0.0–15.0)
Seg Neut %: 68.7 %
WBC: 8.9 10*3/uL (ref 3.5–11.0)

## 2025-01-07 LAB — BASIC METABOLIC PANEL
Anion Gap: 10 (ref 7–16)
CO2: 22 mmol/L (ref 20–28)
Calcium: 8.8 mg/dL (ref 8.8–10.2)
Chloride: 102 mmol/L (ref 96–108)
Creatinine: 0.53 mg/dL (ref 0.51–0.95)
Glucose: 70 mg/dL (ref 60–99)
Lab: 7 mg/dL (ref 6–20)
Potassium: 4.4 mmol/L (ref 3.3–5.1)
Sodium: 134 mmol/L (ref 133–145)
eGFR BY CREAT: 123

## 2025-01-07 LAB — TYPE AND SCREEN
ABO RH Blood Type: O POS
Antibody Screen: NEGATIVE

## 2025-01-07 LAB — BHCG, QUANT PREGNANCY: BHCG, QUANT PREGNANCY: 129640 m[IU]/mL — ABNORMAL HIGH (ref 0–1)

## 2025-01-07 MED ORDER — ACETAMINOPHEN 500 MG PO TABS *I*
1000.0000 mg | ORAL_TABLET | Freq: Once | ORAL | Status: AC
  Administered 2025-01-07: 1000 mg via ORAL
  Filled 2025-01-07: qty 2

## 2025-01-07 NOTE — Discharge Instructions (Signed)
 You were seen in the ED for evaluation of vaginal bleeding in pregnancy. Your work up found your twins have active heart beats. Follow up with your OBGYN as soon as possible. Please follow up with your Primary Care Doctor in 1-2 days. Please return to the Emergency Department for worsening of your symptoms.

## 2025-01-07 NOTE — ED Triage Notes (Addendum)
 Pt reports she thinks that she is about 4 months pregnant, not confirmed with US  has appointment 1/28. Pt states that she started have vaginal bleeding with cramping. Pt reports she took Urine test about 1 month ago, LMP October 2025. Denies currently feeling dizzy/lightheaded  Prehospital medications given: No

## 2025-01-07 NOTE — ED Provider Notes (Signed)
 History Chief Complaint Patient presents with  Vaginal Bleeding HPIJasmine Rose is a 34 y/o female presenting to the ED for vaginal bleeding. Pt reports she is currently pregnant. LMP October 2025. She states she has a had a confirmed IUP with twins. Pt state she developed abdominal cramping beginning yesterday with vaginal bleed. She states she has passed to clots. Denies fevers, chills, chest pain, sob, n/v. Denies dysuria and diarrhea. Medical/Surgical/Family History Past Medical History[1] Patient Active Problem List Diagnosis Code  Dichorionic diamniotic twin pregnancy in first trimester O30.041  May be eligible for HPV vaccination PPM Z23  Fall W19.XXXA  Anemia in pregnancy, second trimester O99.012  Elevated glucose tolerance test R73.09  H/O sickle cell trait Z86.2  AMA (advanced maternal age) multigravida 35+ O09.529  Abdominal pain affecting pregnancy O26.899, R10.9  Heartburn during pregnancy O26.899, R12  Pyelonephritis affecting pregnancy O23.00  Pyelonephritis N12  Pelvic pain R10.20  Breech presentation, fetus 1 of multiple gestation O71.1XX1  History of cesarean section complicating pregnancy O34.219  Pyelonephritis N12  S/P repeat low transverse C-section Z98.891  PPH (postpartum hemorrhage) O72.1  ABLA (acute blood loss anemia) D62  Mastitis N61.0  Past Surgical History[2] Social History[3]  Review of SystemsPhysical Exam Triage VitalsTriage Start: Start, (01/07/25 1357)  First Recorded BP: 132/79, Resp: 16, Temp: 37 C (98.6 F), Temp src: TEMPORAL Oxygen Therapy SpO2: 100 %, Oximetry Source: Lt Hand, O2 Device: None (Room air), Heart Rate: 89, (01/07/25 1400)  .First Pain Reported 0-10  Pain Scale: 7, (01/07/25 1400) Physical ExamVitals and nursing note reviewed. Constitutional:     General: She is not in acute distress.   Appearance: Normal appearance. She is not  ill-appearing. HENT:    Head: Normocephalic and atraumatic.    Nose: Nose normal.    Mouth/Throat:    Mouth: Mucous membranes are moist. Eyes:    Extraocular Movements: Extraocular movements intact.    Pupils: Pupils are equal, round, and reactive to light. Cardiovascular:    Rate and Rhythm: Normal rate.    Pulses: Normal pulses. Pulmonary:    Effort: Pulmonary effort is normal. No respiratory distress.    Breath sounds: Normal breath sounds. Abdominal:    General: Abdomen is flat. There is no distension.    Palpations: Abdomen is soft. There is no mass.    Tenderness: There is no abdominal tenderness. Musculoskeletal:       General: Normal range of motion.    Cervical back: Normal range of motion. Skin:   General: Skin is warm.    Capillary Refill: Capillary refill takes less than 2 seconds. Neurological:    General: No focal deficit present.    Mental Status: She is alert and oriented to person, place, and time.    Cranial Nerves: No cranial nerve deficit.    Sensory: No sensory deficit.    Motor: No weakness.    Coordination: Coordination normal. Medical Decision Making Assessment:  34 y/o female presenting to the ED for vaginal bleeding while pregnantDifferential diagnosis:  Spontaneous abortion, threatened miscarriagePlan:  Will evaluate patient withOrders Placed This Encounter    CBC and differential    Plasma profile 7 [name] ED only)    BHCG, quant pregnancy    Type and screen    Insert peripheral IVMedicationsacetaminophen (TYLENOL ) tablet 1,000 mg (1,000 mg Oral Given 01/07/25 1533)ED Course and Disposition:  Bedside US  shows twin gestation with active movement and normal FHR. PT has OB appointment next week. Pt is stable for discharge home with PCP follow up. Pt  given strict return precautions.  Madison Alpha, MD  [1] Past Medical History:Diagnosis Date  Anemia    History of blood transfusion   per patient with delivery of her daughter in 2009  Migraines   Sickle cell anemia   Sickle cell trait per patient [2] Past Surgical History:Procedure Laterality Date  CESAREAN SECTION, UNSPECIFIED  10/20/2008  COSMETIC SURGERY  2021  [place]dominoplasty- [place]  PR CESAREAN DELIVERY ONLY W/POSTPARTUM CARE N/A 07/06/2024  Procedure: CESAREAN SECTION;  Surgeon: Madison Welford Bradley, MD;  Location: [facility] L&D;  Service: OBGYN [3] Social HistoryTobacco Use  Smoking status: Never   Passive exposure: Never Substance Use Topics  Alcohol  use: Never  Drug use: Never  Beamon-Scott, Madison Houston, MD01/24/26 1746

## 2025-01-07 NOTE — ED Notes (Signed)
 Report Given ToJenna, RNDescriptive Sentence / Reason for Admission pt reports she thinks that she is about 4 months pregnant, not confirmed with US  has appointment 1/28. Pt states that she started have vaginal bleeding with cramping. Pt reports she took Urine test about 1 month ago, LMP October 2025. Denies currently feeling dizzy/lightheaded Active Issues / Relevant Events Full CodeA/Ox4AmbulatoryTo Do ListVS/AMeds per MARAnticipatory Guidance / Discharge Planningpending

## 2025-01-07 NOTE — First Provider Contact (Signed)
 ED First Provider Contact NoteInitial provider evaluation performed by ED First Provider Contact   Date/Time Event User Comments  01/07/25 1359 ED First Provider Contact Jaquayla Hege, [facility] Initial Face to Face Provider Contact  34 yo female reports positive UPT 1 month ago and presents with pelvic cramping and vaginal bleeding that began 2 days ago. Believes LMP was 10/21. She has not yet had any prenatal care.Vital signs reviewed.Assessment: vaginal bleedingOrders placed:LABS Patient requires further evaluation. Newell JULIANNA Schlossman, [place], 01/07/2025, 1:59 PM Schlossman Newell F, PA01/24/26 [phone]

## 2025-01-11 ENCOUNTER — Ambulatory Visit: Admitting: Plastic Surgery

## 2025-01-11 ENCOUNTER — Ambulatory Visit: Payer: MEDICAID

## 2025-01-11 ENCOUNTER — Other Ambulatory Visit: Payer: Self-pay

## 2025-01-11 ENCOUNTER — Ambulatory Visit

## 2025-01-11 VITALS — BP 132/93 | HR 99 | Ht 64.0 in | Wt 218.8 lb

## 2025-01-11 VITALS — BP 108/73 | HR 87 | Ht 69.0 in | Wt 237.0 lb

## 2025-01-11 DIAGNOSIS — Z3490 Encounter for supervision of normal pregnancy, unspecified, unspecified trimester: Secondary | ICD-10-CM

## 2025-01-11 DIAGNOSIS — Z3009 Encounter for other general counseling and advice on contraception: Secondary | ICD-10-CM

## 2025-01-11 DIAGNOSIS — Z332 Encounter for elective termination of pregnancy: Secondary | ICD-10-CM

## 2025-01-11 DIAGNOSIS — Z01818 Encounter for other preprocedural examination: Secondary | ICD-10-CM

## 2025-01-11 DIAGNOSIS — R52 Pain, unspecified: Secondary | ICD-10-CM

## 2025-01-11 DIAGNOSIS — Z708 Other sex counseling: Secondary | ICD-10-CM

## 2025-01-11 DIAGNOSIS — G43909 Migraine, unspecified, not intractable, without status migrainosus: Secondary | ICD-10-CM | POA: Insufficient documentation

## 2025-01-11 DIAGNOSIS — N62 Hypertrophy of breast: Secondary | ICD-10-CM

## 2025-01-11 DIAGNOSIS — N6489 Other specified disorders of breast: Secondary | ICD-10-CM

## 2025-01-11 DIAGNOSIS — I5022 Chronic systolic (congestive) heart failure: Secondary | ICD-10-CM

## 2025-01-11 DIAGNOSIS — M546 Pain in thoracic spine: Secondary | ICD-10-CM

## 2025-01-11 DIAGNOSIS — M545 Low back pain, unspecified: Secondary | ICD-10-CM

## 2025-01-11 MED ORDER — MIFEPRISTONE 200 MG PO TABS *I*
200.0000 mg | ORAL_TABLET | Freq: Once | ORAL | Status: AC
  Administered 2025-01-11: 200 mg via ORAL

## 2025-01-11 MED ORDER — IBUPROFEN 600 MG PO TABS *I*
600.0000 mg | ORAL_TABLET | Freq: Four times a day (QID) | ORAL | 0 refills | Status: AC | PRN
  Filled 2025-01-11: qty 30, 8d supply, fill #0

## 2025-01-11 MED ORDER — MISOPROSTOL 200 MCG PO TABS *I*
400.0000 ug | ORAL_TABLET | Freq: Once | ORAL | Status: AC
  Administered 2025-01-11: 400 ug via ORAL

## 2025-01-11 NOTE — Progress Notes (Signed)
 "   Referring Provider Burnetta Aures, MD [address] [place],  [place] [address]   CC:  Chief Complaint  Patient presents with   Advice Only      Robin Gallegos is an 34 y.o. female.  HPI: Ms. Ramsaran is a 34 year old female who is referred from orthopedics for consideration of a breast reduction.  Patient has had significant thoracolumbar discomfort and has required steroid injections which have helped as well as physical therapy.  She still has low back pain.  She complains of years worth of upper back pain which she attributes to the large size of her breast.  She is interested in a bilateral breast reduction.  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  She is requesting surgical reduction of the size of her breast  Allergies[1]  Outpatient Encounter Medications as of 01/11/2025  Medication Sig   carvedilol  (COREG ) 25 MG tablet Take 1 tablet (25 mg total) by mouth 2 (two) times daily with a meal.   dapagliflozin  propanediol (FARXIGA ) 10 MG TABS tablet Take 1 tablet (10 mg total) by mouth daily before breakfast.   furosemide  (LASIX ) 20 MG tablet Take 1 tablet (20 mg total) by mouth as needed (3lb weight gain overnight or 5lb weightgain in a week).   sacubitril -valsartan  (ENTRESTO ) 97-103 MG Take 1 tablet by mouth 2 (two) times daily.   spironolactone  (ALDACTONE ) 25 MG tablet Take 2 tablets (50 mg total) by mouth daily.   methocarbamol  (ROBAXIN ) 500 MG tablet Take 1 tablet (500 mg total) by mouth 2 (two) times daily. (Patient not taking: Reported on 01/11/2025)   naproxen  (NAPROSYN ) 500 MG tablet Take 1 tablet (500 mg total) by mouth 2 (two) times daily. (Patient not taking: Reported on 01/11/2025)   nitrofurantoin , macrocrystal-monohydrate, (MACROBID ) 100 MG capsule Take 1 capsule (100 mg total) by mouth 2 (two) times daily. (Patient not taking: Reported on 01/11/2025)   No facility-administered encounter medications on  file as of 01/11/2025.     Past Medical History:  Diagnosis Date   CHF (congestive heart failure) (HCC)    Pre-eclampsia     No past surgical history on file.  Family History  Problem Relation Age of Onset   Hypertension Mother    Heart failure Father    Hypertension Sister    Hypertension Sister    Hypertension Sister     Social History   Social History Narrative   Not on file     Review of Systems General: Denies fevers, chills, weight loss CV: Denies chest pain, shortness of breath, palpitations Breast: Large breasts which the patient and her orthopedic surgeon feel are contributing to her thoracolumbar pain.  Physical Exam    01/11/2025    1:58 PM 09/30/2024    6:07 AM 09/30/2024    2:35 AM  Vitals with BMI  Height 5' 4    Weight 218 lbs 13 oz    BMI 37.54    Systolic 132 131 827  Diastolic 93 101 127  Pulse 99 86 98    General:  No acute distress,  Alert and oriented, Non-Toxic, Normal speech and affect Breast: Patient has large breasts with grade 3 ptosis.  There are no dominant masses on physical exam the nipples are normal in appearance there is no nipple discharge today.  Patient sternal notch to nipple distance on the right is 42 cm and 41 cm on the left her nipple to fold distance on the  right is 19 cm and 17 cm on the left Mammogram: Not applicable due to age Assessment/Plan Symptomatic macromastia: Patient has large breast and may benefit from a breast reduction especially with her upper back pain.  There is less certainty that the reduction will benefit her lower back pain.  We discussed this at length.  I believe that I can remove 800 g to 900 g per breast.  We discussed breast reductions and I showed her the location of the incisions.  We discussed the unpredictable nature of scarring and wound healing.  We discussed the risks of bleeding, infection, seroma formation.  She understands I will use drains postoperatively to help decrease risk of seroma.   We discussed the risk of nipple loss due to nipple ischemia.  We discussed the possibility that mammograms may be more difficult to interpret in the future.  We discussed the postoperative limitations including no heavy lifting greater than 20 pounds, no vigorous activity, no submerging incisions in water for 6 weeks.  She will need to wear supportive compressive garment for that 6 weeks.  She will be encouraged to begin ambulation immediately after surgery to help decrease the risk of DVT.  She may return to light activity as tolerated.  All questions were answered to her satisfaction.  Photographs were obtained today with her consent.  Will submit her for a bilateral breast reduction at her request.  Patient will need cardiac clearance.  Leonce KATHEE Birmingham 01/11/2025, 6:32 PM         [1]  Allergies Allergen Reactions   Penicillins Hives    Did it involve swelling of the face/tongue/throat, SOB, or low BP? Unknown Did it involve sudden or severe rash/hives, skin peeling, or any reaction on the inside of your mouth or nose? Unknown Did you need to seek medical attention at a hospital or doctor's office? Unknown When did it last happen?     pt was a child  If all above answers are NO, may proceed with cephalosporin use.    Cleocin  [Clindamycin ] Rash   "

## 2025-01-11 NOTE — Preop H&P (Addendum)
 CHIEF COMPLAINTJasmine Rose presents with an unplanned pregnancy 34 y.o. H1E7946 at 15 weeks 0 days with didi twin pregnancy by ultrasound dating today.HISTORY OF PRESENT ILLNESS Patient is sure of her decision to proceed with abortion. She had a recent cesarean section for twins in July of 2025 which was complicated by PPH intra-op (2.5L) followed by a post-op PPH of about 1L. She required 1 unit of pRBCs. That pregnancy was also complicated by gHTN and she was discharged home on Nifedipine  which has stopped taking. Patient is not breast feeding. She desires depo for Madison Rose. Declined STI testing. FOB unaware of abortion, would not agree with it if he found out. Cousin Madison Rose will be ride and is aware and supportive of abortion decision. When she first suspected pregnancy: stopped sleeping, was still having period When she was diagnosed pregnant:: UPT in December Was she using contraception?: noShe's told about the pregnancy: FOB, two family membersShe's told about the abortion decision:  two family Madison Rose is her support for the abortion? Madison Rose She comes to the office today with: two babiesIs she being coerced/forced by anyone to have the abortion? noIf anyone finds out about the abortion will she be in harm? noName of their most recent Gyn/PCP: [facility] CONTRACEPTIVE HISTORY Prior methods used: Nexplanon, made her bleed for 6 months, IUD was uncomfortable, pills MEDICATIONS Current Medications[1]ALLERGIES Allergies[2]MEDICAL HISTORYPast Medical History[3]SURGICAL HISTORYPast Surgical History[4]PERSONAL HISTORY Patient lives with : two babies and 72 year old Working currently : Yes per [employer] pregnant with current partner? Yes        History of Domestic Violence with current partner? NoSocial History[5]Family History[6]GYNECOLOGIC HISTORYMenses:regular every 28-30 days  STIs: noneDesire for full serum STI  testing: declinedLast Pap was: 09/23/21 NILM HPV negativeOB History Gravida Para Term Preterm AB Living 8 2 2  0 5 3 SAB IAB Ectopic Multiple Live Births 0 0 0 1 3  # Outcome Date GA Lbr Len/2nd Weight Sex Type Anes PTL Lv 8 Current          7A Term 07/06/24 [redacted]w[redacted]d  3200 g (7 lb 0.9 oz) F CSLT SPINAL- N LIV    Birth Comments: None    Name: Girl A Madison Rose    Apgar1: 8  Apgar5: 9 7B Term 07/06/24 [redacted]w[redacted]d  2930 g (6 lb 7.4 oz) F CSLT SPINAL- N LIV    Birth Comments: None    Name: Girl B Madison Rose    Apgar1: 8  Apgar5: 9 6 Term 10/20/08 [redacted]w[redacted]d  3610 g (7 lb 15.3 oz) F *CSLT Spinal N LIV    Complications: Fetal Intolerance    Name: Madison Rose 5 AB      TAB    4 AB      SAB    3 AB      SAB    2 AB      TAB    1 AB      TAB     Obstetric Comments 12/23/23: No carrier screening results found. Pt states she is a carrier of sickle cell trait. States the FOB is not.  G7 P1051 is correct, does not wish to add dates of prior TABs/SABs Options Counseling: The patient was counseled about her pregnancy options of parenthood, adoption and abortion.  She expressed her sure decision for abortion. For Mifepristone /Misoprostol  cervical preparation:  Second trimester D&E procedure reviewed in detail.  Reviewed that the procedure starts with taking the mifepristone  in the office.  Reviewed that she must be sure of her decision before  she takes the mifepristone .   Risk of pain, bleeding, infection, cervical lacerations, uterine perforations, damage to internal organs/tissues, need for L/S, need for laparotomy, need for blood transfusion, need for hysterectomy, risk of retained POCs, VTE, anesthesia complications (heart/lung problems) and inability to complete the procedure reviewed.  Reviewed prevention of these complications - antibiotics perioperatively, screening for cervical infection, sterile techniques, pain meds, cervical prep prior to OR procedure, US  guidance intraop,  visual inspection of the tissues, NPO status, prophylactic uterotonics, DVT prophylaxis, etc.  Reviewed that once she take the mifepristone , normal activity okay except - nothing per vagina and no tub bathing/swimming.  Patient voices understanding and consent signed.Blood Transfusion Consent: She was counseled on the low risk for blood transfusion with risk of transfusion reaction and infection such as HIV/hepatitis but was recommended to accept a transfusion in case of a life-threatening hemorrhage and she accepts.Contraceptive Counseling: The patient was counseled in detail about her contraceptive options.  She has decided to use depo provera for post-abortal contraception. Vitals:  01/11/25 1006 BP: 108/73 Pulse: 87 Weight: 107.5 kg (237 lb) Height: 1.753 m (5' 9) PHYSICAL EXAM General: normal  Mental Status: Alert, normal MS. Answers all questions appropriately.Lungs: CTA bilaterally, no wheezes with forced expiration, respirations unlabored.Heart: Regular rate without murmurs. ASSESSMENT33 y.o. H1E7946 at [redacted]W[redacted]D with didi twin pregnancy by ultrasound dating today Patient requests termination of pregnancy and consents to abortion.Continued pregnancy is a threat to her emotional/psychological health.PLANPreOpCheck type and screen.  If Rh negative, will administer Rhogam postabortally. O RH POSCheck CBC.STI screen: DeclinedDeclined HIV/Syphilis/Hep B/Hep C Postabortally, she plans to use depo provera She was given prescription for Ibuprofen  600 mg PO Q6-8 hrs prn.2nd trimester surgical TOP: She will have the D and E procedure on Friday 1/30 at 7:30amWe reviewed her preop instructions with her including nothing to eat - no food, no gum, no candy - after midnight the day of her procedure. Instructed that she can have clear liquids (water , Gatorade, Pedialyte) until 4am and to drink two 8 ounce glasses of water  in the last hour allowed.  Post op expectations discussed and detailed post op instructions given.  All questions answered. She was given 200 mg of mifepristone  (lot number: [id number], exp date: 2/29) orally  in the office for cervical prep.She will take misoprostol  400 mcg vaginally 3 hours preop.She will have SCDs placed in the OR for DVT prophylaxis.Lucie Potters, PGY-5Complex Family Planning FellowI was personally present and performed the key or critical portion of the service. I agree with the resident's/fellow's assessment and was directly involved in and approved the management plan.Glade Repress, MD [1] Current Outpatient Medications:   prenatal multivitamin (PRENATAL VITAMINS) tablet, Take 1 tablet by mouth every day, Disp: 90 tablet, Rfl: 3  ibuprofen  (ADVIL ,MOTRIN ) 600 mg tablet, Take 1 tablet (600 mg total) by mouth 4 times daily as needed for Pain., Disp: 30 tablet, Rfl: 0  NIFEdipine  (ADALAT  CC) 60 mg 24 hr tablet, Take 1 tablet (60 mg total) by mouth daily. Swallow whole. Do not crush, break, or chew., Disp: 30 tablet, Rfl: 0  ibuprofen  (ADVIL ,MOTRIN ) 600 mg tablet, Take 1 tablet (600 mg total) by mouth every 6 hours as needed for Pain., Disp: 60 tablet, Rfl: 0  famotidine  (PEPCID ) 20 mg tablet, Take 1 tablet (20 mg total) by mouth 2 times daily for Heartburn., Disp: , Rfl:   metoclopramide  (REGLAN ) 10 mg tablet, Take 1 tablet (10 mg total) by mouth 4 times daily (before meals and nightly) for Migraine Headache., Disp:  120 tablet, Rfl: 0  acetaminophen  (TYLENOL ) 500 mg tablet, Take 1 tablet by mouth every 6 hours as needed for pain., Disp: 28 tablet, Rfl: 0[2] AllergiesAllergen Reactions  Ak-Mycin [Erythromycin ] Swelling   All mycins- throat swelling and hard to breath  Elemental Sulfur Hives  Clindamycin Other (See Comments)   unknown  Clindamycin/Lincomycin Other (See Comments) and Hives   unknown [3] Past Medical History:Diagnosis Date  Anemia    History of blood transfusion   per patient with delivery of her daughter in 2009  Sickle cell anemia   Sickle cell trait per patient [4] Past Surgical History:Procedure Laterality Date  CESAREAN SECTION, UNSPECIFIED  10/20/2008  COSMETIC SURGERY  2021  abdominoplasty- [place]  PR CESAREAN DELIVERY ONLY W/POSTPARTUM Rose N/A 07/06/2024  Procedure: CESAREAN SECTION;  Surgeon: Fleeta Welford Bradley, MD;  [facility] L&D;  Service: OBGYN [5] Social HistorySocioeconomic History  Marital status: Single  Highest education level: GED or equivalent Tobacco Use  Smoking status: Never   Passive exposure: Never Substance and Sexual Activity  Alcohol  use: Never  Drug use: Never  Sexual activity: Yes   Partners: Male [6] Family HistoryProblem Relation Name Age of Onset  Asthma Mother    Diabetes Mother    High Blood Pressure Mother    Sickle cell trait Father    Sickle cell trait Sister    Asthma Daughter

## 2025-01-11 NOTE — Invasive Procedure Plan of Care (Signed)
 CONSENT FOR MEDICALOR SURGICAL PROCEDURE                            Patient Name: Madison Rose 89 MR                                                              DOB: [birth year]     Please read this form or have someone read it to you. It's important to understand all parts of this form. If something isn't clear, ask us  to explain. When you sign it, that means you understand the form and give us  permission to do this surgery or procedure. I agree for Windell Lurene Shorter, MD , and Ob/Gyn team (may be a resident, fellow or student) along with any assistants* they may choose, to treat the following condition(s): Removal of pregnancy tissue from the uterus By doing this surgery or procedure on me: Dilating the cervix (using medications and or dilators) and using suction and instruments to remove the pregnancy tissue. This is also known as: Dilation and suction/evacuation Laterality: Not applicable *if you'd like a list of the assistants, please ask. We can give that to you.1. The care provider has explained my condition to me. They have told me how the procedure can help me. They have told me about other ways of treating my condition. I understand the care provider cannot guarantee the result of the procedure. If I don't have this procedure, my other choices are: Medical treatment if less than 11 weeks, possible labor induction 2. The care provider has told me the risks (problems that can happen) of the procedure. I understand there may be unwanted results. The risks that are related to this procedure include: Pain, bleeding, infection, damage to the cervix or uterus, scar tissue, damage to other tissues and organs in the body (bowel, bladder, vessels, nerves, tubes, ovaries, etc.), inability to complete the procedure, need for more surgery to look around or fix things, bleeding, need for blood transfusion, blood clots in the vessels or lungs, anesthesia problems  like heart and lungs not working, and rare fatal complications3. I understand that during the procedure, my care provider may find a condition that we didn't know about before the treatment started. Therefore, I agree that my care provider can perform any other treatment which they think is necessary and available.4. I give permission to the hospital and/or its departments to examine and keep tissue, blood, body parts, fluids or materials removed from my body during the procedure(s) to aid in diagnosis and treatment, after which they may be used for scientific research or teaching by appropriate persons. If these materials are used for science or teaching, my identity will be protected. I will no longer own or have any rights to these materials regardless of how they may be used.5. My care provider might want a representative from a medical device company to be there during my procedure. I understand that person works for:        The ways they might help my care provider during my procedure include:      6. Here are my decisions about receiving blood, blood products, or tissues. I understand my decisions cover the time before, during and after my procedure, my treatment,  and my time in the hospital. After my procedure, if my condition changes a lot, my care provider will talk with me again about receiving blood or blood products. At that time, my care provider might need me to review and sign another consent form, about getting or refusing blood.I understand that the blood is from the community blood supply. Volunteers donated the blood, the volunteers were screened for health problems. The blood was examined with very sensitive and accurate tests to look for hepatitis, HIV/AIDS, and other diseases. Before I receive blood, it is tested again to make sure it is the correct type.My chances of getting a sickness from blood products are small. But no transfusion is 100% safe. I understand  that my care provider feels the good I will receive from the blood is greater than the chances of something going wrong. My care provider has answered my questions about blood products.My decisionabout blood orblood products Yes, I agree to receive blood or blood products if my care provider thinks they're needed.  My decision about tissueImplants N/A  I understand thisform.My care provideror his/herassistants haveexplained: What I am having done and why I need it.What other choices I can make instead of having this done.The benefits and possible risks (problems) to me of having this done.The benefits and possible risks (problems) to me of receiving transplants, blood, or blood products.There is no guarantee of the results.The care provider may not stay with me the entire time that I am in the operating or procedure room.My provider has explained how this may affect my procedure. My provider has answered myquestions about this. I give mypermission forthis surgery orprocedure.        _________________________________________                                   My signature(or parent or other person authorized to sign for you, if you are unable to sign for yourself or if you are under 18 years old)       _____________   Date    (MM/DD/YYYY)     _______  Time  Electronic Signatures will display at the bottom of the consent form.Care provider's statement: I have discussed the planned procedure, including the possibility for transfusion of bloodproducts or receipt of tissue as necessary; expected benefits; the possible complications and risks; and possible alternativesand their benefits and risks with the patients or the patient's surrogate. In my opinion, the patient or the patient's surrogateunderstands the proposed procedure, its risks, benefits and alternatives.      Electronically signed by:  Lucie Potters, MD                                          01/11/2025       Date    10:36 AM      Time Your doctor or someone your doctor has appointed has told you that you may need blood or a blood product transfusion, which has been collected from volunteers, as part of your treatment as a patient.  The reasons you might need blood or blood products include, but are not limited to: Significant loss of your own bloodYour body may not be getting enough oxygen to its tissues Treatment of bleeding disorders caused by low platelet counts or platelets that do not work right (platelets are  part of a cell that helps to form clots and keeps you from bleeding too much).You may not have enough of other substances that help your blood clot or stop you from bleeding moreThe risks of getting a transfusion of blood or blood products include, but are not limited to: Damage to the lungsDifficulty breathing due to fluid in the lungsThe product may contain bacteria or rarely a virus (which includes HIV and Hepatitis)Blood from the community blood supply has been collected from volunteer donors who have been screened for health risk. The blood has been tested for major blood transmitted disease, but no transfusion is 100% safe. The blood is tested with very sensitive and accurate tests to screen for hepatitis, AIDS, and other disease, which makes the risks very small.You may have side effects from the transfusion (rash, fever, chills) or an allergic reactionThe transfusion increases your risks of getting infection or cancer coming backThe transfusion can increase the time you have to stay in the hospitalThe transfusion can potentially cause death if the wrong blood is given or your body rejects the bloodBefore blood is transfused, it is tested again to make sure it is the correct typeThere are other options than getting blood or blood products from other people and they include: Drugs  which can decrease bleedingDrugs which can cause your body to make more blood (used in elective procedures with advance notice)Autologous (your own blood) donation (needs pre-arrangement)No transfusionIf you exercise your right to refuse to be transfused with blood or blood products; these things listed below, among others, could happen to you:Your body may not get enough oxygen and suffer damageYou may have a higher chance of bleedingYou may limit other options for your conditionYou may die from losing too much blood

## 2025-01-12 NOTE — Discharge Instructions (Addendum)
 Aftercare InstructionsUR[name] Department of Obstetrics and Gynecology***30mg  Toradol  was given @ 7am***(Toradol  is a NSAID, just like Ibuprofen . You can take Ibuprofen  for pain every 6-8hrs)Recovery Time You should rest as much as possible on the day of your procedure. You may have feelings of relief and sadness. If you need to discuss your feelings, please call us . Or you can call one of these toll-free hotlines: [phone] [facility] and Breathe, wavehands.pl), ([phone]) [phone] [employer] (712)[phone], [employer], www.cardclass.ca) or [phone] (All-Options, www.all-options.org) You may resume normal activities the following day. You may feel tired for a few days and should avoid strenuous physical activity, such as heavy lifting, for the next week. If you had sedation, do not engage in any unnecessary or extra activities, operate a motor vehicle or any equipment that might cause injury, or make important decisions for at least 24 hours after recovery from sedation.Prevention of InfectionTo prevent infection we will give you an IV antibiotic during your procedure. We advise that you not use tampons, have vaginal sexual intercourse, swim, douche or sit in a hot tub for one week. You can use menstrual pads and take showers.Follow Up CareScheduled clinic follow up appointments are not indicated for all patients. We may recommend it for some patients and we will let you know if we want to schedule a follow up.  For everyone else, please call and let us  know if you are having any problems or have concerns.   WARNING SIGNS After the ProcedureIf you have any of the following problems, be sure to call us  as soon as possible at [phone]:FEVER of 101F or higher or any shaking chillsHEAVY vaginal bleeding (soaking through two or more maxi pads in one hour or less)It is normal to have some  bleeding after the procedure. It may be different from your usual period bleeding. You can have a heavy flow for a few days. Spotting can last up to 4 weeks. It is also normal to have no bleeding. Your next regular period will usually begin again in 4 to 6 weeks.Call us  if you have any of the following: (1) Bleeding that soaks through two or more maxi pads in one hour or less, (2) Continued bleeding (more than spotting) for more than 10 days after the procedure, (3) Passing large blood clots, or (4) Feeling lightheaded, dizzy or passing out.SEVERE abdominal or back painIt is normal to have mild cramps for the first week. Too much vigorous activity can increase cramping. Use a heating pad, ibuprofen  (same as Motrin  or Advil ), or acetaminophen  (Tylenol ) and get some rest. Call us  if you have severe cramping that the medication does not help.Foul smelling vaginal dischargeRash or hives after taking any medicationsIf you have not started a new menstrual period by 6 weeks after the procedure

## 2025-01-13 ENCOUNTER — Telehealth (HOSPITAL_BASED_OUTPATIENT_CLINIC_OR_DEPARTMENT_OTHER): Payer: Self-pay

## 2025-01-13 ENCOUNTER — Ambulatory Visit: Payer: Self-pay

## 2025-01-13 ENCOUNTER — Encounter: Payer: Self-pay | Admitting: Obstetrics and Gynecology

## 2025-01-13 ENCOUNTER — Encounter
Admission: RE | Disposition: A | Discharge status: Home / Self Care | Payer: Self-pay | Source: Ambulatory Visit | Attending: Obstetrics and Gynecology

## 2025-01-13 ENCOUNTER — Ambulatory Visit
Admission: RE | Admit: 2025-01-13 | Discharge: 2025-01-13 | Disposition: A | Discharge status: Home / Self Care | Source: Ambulatory Visit | Attending: Obstetrics and Gynecology | Admitting: Obstetrics and Gynecology

## 2025-01-13 ENCOUNTER — Other Ambulatory Visit: Payer: Self-pay

## 2025-01-13 DIAGNOSIS — Z332 Encounter for elective termination of pregnancy: Secondary | ICD-10-CM

## 2025-01-13 DIAGNOSIS — Z3A13 13 weeks gestation of pregnancy: Secondary | ICD-10-CM

## 2025-01-13 LAB — CBC
Hematocrit: 31 % — ABNORMAL LOW (ref 34–49)
Hemoglobin: 10.5 g/dL — ABNORMAL LOW (ref 11.2–16.0)
MCV: 90 fL (ref 75–100)
Platelets: 271 10*3/uL (ref 150–450)
RBC: 3.4 MIL/uL — ABNORMAL LOW (ref 4.0–5.5)
RDW: 13.7 % (ref 0.0–15.0)
WBC: 8.1 10*3/uL (ref 3.5–11.0)

## 2025-01-13 LAB — TYPE AND SCREEN
ABO RH Blood Type: O POS
Antibody Screen: NEGATIVE

## 2025-01-13 LAB — RED BLOOD CELLS
Component blood type: O POS
Component blood type: O POS

## 2025-01-13 MED ORDER — OXYCODONE HCL 10 MG PO TABS *I*
ORAL_TABLET | ORAL | Status: AC
  Filled 2025-01-13: qty 1

## 2025-01-13 MED ORDER — MIDAZOLAM HCL 1 MG/ML IJ SOLN *I* WRAPPED
INTRAMUSCULAR | Status: AC
  Filled 2025-01-13: qty 2

## 2025-01-13 MED ORDER — ACETAMINOPHEN 500 MG PO TABS *I*
500.0000 mg | ORAL_TABLET | Freq: Four times a day (QID) | ORAL | 0 refills | Status: AC | PRN
  Filled 2025-01-13: qty 30, 8d supply, fill #0

## 2025-01-13 MED ORDER — ALBUTEROL SULFATE (2.5 MG/3ML) 0.083% IN NEBU *I*: 2.5000 mg | INHALATION_SOLUTION | Freq: Once | RESPIRATORY_TRACT | Status: DC | PRN

## 2025-01-13 MED ORDER — LACTATED RINGERS IV SOLN *I*: INTRAVENOUS | Status: DC | PRN

## 2025-01-13 MED ORDER — OXYCODONE HCL 5 MG/5ML PO SOLN *I*: 10.0000 mg | Freq: Once | ORAL | Status: DC | PRN

## 2025-01-13 MED ORDER — DOXYCYCLINE 100 MG / 110 ML NS *I*
INTRAVENOUS | Status: DC | PRN
  Administered 2025-01-13: 100 mg via INTRAVENOUS

## 2025-01-13 MED ORDER — DEXTROSE 5 % FLUSH FOR PUMPS *I*: 0.0000 mL/h | INTRAVENOUS | Status: DC | PRN

## 2025-01-13 MED ORDER — SODIUM CHLORIDE 0.9 % IV SOLN WRAPPED *I*: 20.0000 mL/h | Status: DC

## 2025-01-13 MED ORDER — PROPOFOL 10 MG/ML IV EMUL (INTERMITTENT DOSING) WRAPPED *I*
INTRAVENOUS | Status: DC | PRN
  Administered 2025-01-13 (×2): 20 mg via INTRAVENOUS
  Administered 2025-01-13: 30 mg via INTRAVENOUS

## 2025-01-13 MED ORDER — LACTATED RINGERS IV SOLN *I*: 150.0000 mL/h | INTRAVENOUS | Status: DC

## 2025-01-13 MED ORDER — DOXYCYCLINE 100 MG / 110 ML NS *I*: 100.0000 mg | Freq: Once | INTRAVENOUS | Status: DC

## 2025-01-13 MED ORDER — HYDROMORPHONE HCL PF 1 MG/ML IJ SOLN *WRAPPED*: 0.2500 mg | INTRAMUSCULAR | Status: DC | PRN

## 2025-01-13 MED ORDER — HALOPERIDOL LACTATE 5 MG/ML IJ SOLN *I*: 0.5000 mg | Freq: Once | INTRAMUSCULAR | Status: DC | PRN

## 2025-01-13 MED ORDER — PROCHLORPERAZINE EDISYLATE 5 MG/ML IJ SOLN WRAPPED *I*: 5.0000 mg | Freq: Once | INTRAMUSCULAR | Status: DC | PRN

## 2025-01-13 MED ORDER — MEPERIDINE HCL 25 MG/ML IJ SOLN *I*: 12.5000 mg | INTRAMUSCULAR | Status: DC | PRN

## 2025-01-13 MED ORDER — OXYCODONE HCL 5 MG/5ML PO SOLN *I*: 5.0000 mg | Freq: Once | ORAL | Status: DC | PRN

## 2025-01-13 MED ORDER — PROCHLORPERAZINE EDISYLATE 5 MG/ML IJ SOLN WRAPPED *I*
5.0000 mg | Freq: Four times a day (QID) | INTRAMUSCULAR | Status: DC | PRN
  Administered 2025-01-13: 5 mg via INTRAVENOUS

## 2025-01-13 MED ORDER — FENTANYL CITRATE 50 MCG/ML IJ SOLN *WRAPPED*
INTRAMUSCULAR | Status: AC
  Filled 2025-01-13: qty 2

## 2025-01-13 MED ORDER — VASOPRESSIN 20 UNIT/ML IJ SOLN *WRAPPED*
Status: DC | PRN
  Administered 2025-01-13: 20.2 mL via PERINEURAL

## 2025-01-13 MED ORDER — DOXYCYCLINE 100 MG / 110 ML NS *I*
INTRAVENOUS | Status: AC
  Filled 2025-01-13: qty 220

## 2025-01-13 MED ORDER — OXYCODONE HCL 10 MG PO TABS *I*
10.0000 mg | ORAL_TABLET | Freq: Once | ORAL | Status: AC | PRN
  Administered 2025-01-13: 10 mg via ORAL

## 2025-01-13 MED ORDER — MIDAZOLAM HCL 1 MG/ML IJ SOLN *I* WRAPPED
INTRAMUSCULAR | Status: DC | PRN
  Administered 2025-01-13: 2 mg via INTRAVENOUS

## 2025-01-13 MED ORDER — MEDROXYPROGESTERONE ACETATE 150 MG/ML IM SUSP *I*
INTRAMUSCULAR | Status: DC | PRN
  Administered 2025-01-13: 150 mg via INTRAMUSCULAR

## 2025-01-13 MED ORDER — MEDROXYPROGESTERONE ACETATE 150 MG/ML IM SUSP *I*
INTRAMUSCULAR | Status: AC
  Filled 2025-01-13: qty 1

## 2025-01-13 MED ORDER — MEDROXYPROGESTERONE ACETATE 150 MG/ML IM SUSP *I*: 150.0000 mg | Freq: Once | INTRAMUSCULAR | Status: DC

## 2025-01-13 MED ORDER — SODIUM CHLORIDE 0.9 % FLUSH FOR PUMPS *I*: 0.0000 mL/h | INTRAVENOUS | Status: DC | PRN

## 2025-01-13 MED ORDER — PROCHLORPERAZINE EDISYLATE 5 MG/ML IJ SOLN WRAPPED *I*
INTRAMUSCULAR | Status: AC
  Filled 2025-01-13: qty 2

## 2025-01-13 MED ORDER — LIDOCAINE HCL 1% IJ SOLN *WRAPPED* (FOR LINE PLACEMENT ONLY): 0.1000 mL | INTRAMUSCULAR | Status: DC | PRN

## 2025-01-13 MED ORDER — DEXMEDETOMIDINE HCL 200 MCG/2ML IV SOLN WRAPPED *I*
INTRAVENOUS | Status: DC | PRN
  Administered 2025-01-13 (×2): 8 ug via INTRAVENOUS

## 2025-01-13 MED ORDER — MISOPROSTOL 200 MCG PO TABS *I*
ORAL_TABLET | ORAL | Status: DC | PRN
  Administered 2025-01-13: 800 ug via RECTAL

## 2025-01-13 MED ORDER — ACETAMINOPHEN 500 MG PO TABS *I*
1000.0000 mg | ORAL_TABLET | Freq: Once | ORAL | Status: AC
  Administered 2025-01-13: 1000 mg via ORAL

## 2025-01-13 MED ORDER — PROPOFOL INFUSION 10 MG/ML *I*
INTRAVENOUS | Status: DC | PRN
  Administered 2025-01-13: 150 ug/kg/min via INTRAVENOUS
  Administered 2025-01-13: 200 ug/kg/min via INTRAVENOUS
  Administered 2025-01-13: 175 ug/kg/min via INTRAVENOUS

## 2025-01-13 MED ORDER — OXYTOCIN 30 UNITS IN 500ML NS WRAPPED *I*
INTRAMUSCULAR | Status: DC | PRN
  Administered 2025-01-13: 500 mL via INTRAVENOUS

## 2025-01-13 MED ORDER — METHYLERGONOVINE MALEATE 0.2 MG/ML IJ SOLN *I*
INTRAMUSCULAR | Status: DC | PRN
  Administered 2025-01-13: 200 ug via INTRAMUSCULAR

## 2025-01-13 MED ORDER — IBUPROFEN 600 MG PO TABS *I*
600.0000 mg | ORAL_TABLET | Freq: Four times a day (QID) | ORAL | 0 refills | Status: AC | PRN
  Filled 2025-01-13: qty 30, 8d supply, fill #0

## 2025-01-13 MED ORDER — FENTANYL CITRATE 50 MCG/ML IJ SOLN *WRAPPED*
INTRAMUSCULAR | Status: DC | PRN
  Administered 2025-01-13: 50 ug via INTRAVENOUS
  Administered 2025-01-13 (×2): 25 ug via INTRAVENOUS

## 2025-01-13 MED ORDER — KETOROLAC TROMETHAMINE 30 MG/ML IJ SOLN *I*
INTRAMUSCULAR | Status: AC
  Filled 2025-01-13: qty 1

## 2025-01-13 MED ORDER — HYDROMORPHONE HCL PF 1 MG/ML IJ SOLN *WRAPPED*: 0.5000 mg | INTRAMUSCULAR | Status: DC | PRN

## 2025-01-13 MED ORDER — ACETAMINOPHEN 500 MG PO TABS *I*
ORAL_TABLET | ORAL | Status: AC
  Filled 2025-01-13: qty 2

## 2025-01-13 MED ORDER — LACTATED RINGERS IV SOLN *I*
20.0000 mL/h | INTRAVENOUS | Status: DC
  Administered 2025-01-13: 20 mL/h via INTRAVENOUS

## 2025-01-13 MED ORDER — DOXYCYCLINE 100 MG / 110 ML NS *I*
100.0000 mg | Freq: Once | INTRAVENOUS | Status: AC
  Administered 2025-01-13: 100 mg via INTRAVENOUS

## 2025-01-13 MED ORDER — KETOROLAC TROMETHAMINE 30 MG/ML IJ SOLN *I*
30.0000 mg | Freq: Once | INTRAMUSCULAR | Status: AC
  Administered 2025-01-13: 30 mg via INTRAVENOUS

## 2025-01-13 NOTE — Telephone Encounter (Signed)
" ° ° °  Primary Cardiologist:None  Chart reviewed as part of pre-operative protocol coverage. Because of Robin Gallegos's past medical history and time since last visit, he/she will require a follow-up visit in order to better assess preoperative cardiovascular risk.  Pre-op covering staff: - Please schedule office appointment and call patient to inform them. - Please contact requesting surgeon's office via preferred method (i.e, phone, fax) to inform them of need for appointment prior to surgery.  If applicable, this message will also be routed to pharmacy pool and/or primary cardiologist for input on holding anticoagulant/antiplatelet agent as requested below so that this information is available at time of patient's appointment.   Robin CHRISTELLA Beauvais, NP  01/13/2025, 9:12 AM   "

## 2025-01-13 NOTE — Anesthesia Case Conclusion (Signed)
 CASE CONCLUSIONEmergenceActions:  OPACriteria Used for Airway Removal:  Adequate Tv & RR and acceptable O2 saturationTransportDirectly to: PACUAirway:  Nasal cannula and OPAOxygen Delivery:  4 lpmPosition:  UprightPatient Condition on HandoffLevel of Consciousness:  Moderately sedatedPatient Condition:  StableHandoff Report to:  RN

## 2025-01-13 NOTE — Anesthesia Procedure Notes (Signed)
---------------------------------------------------------------------------------------------------------------------------------------  AIRWAY GENERAL INFORMATION AND STAFF  Patient location during procedure: OR     Date of Procedure: 01/13/2025 7:44 AMCONDITION PRIOR TO MANIPULATION   Current Airway/Neck Condition:  Normal      For more airway physical exam details, see Anesthesia PreOp EvaluationAIRWAY METHOD   Patient Position:  Sniffing  Preoxygenated: yes    Maintained In-Line Stability: not needed, normal c-spine condition        To see details of medications used, see MAR  Induction: IV  Mask Difficulty Assessment:  0 - not attempted  Number of Attempts at Approach:  1  Number of Other Approaches Attempted:  0FINAL AIRWAY DETAILS  Final Airway Type:  Nasal cannula  Adjunct Airway: oropharyngeal airway (OPA)  OPA Size:  90mm  Head position required to avoid obstruction:  Turned to right  Insertion Site:  Left naris and right naris----------------------------------------------------------------------------------------------------------------------------------------

## 2025-01-13 NOTE — Op Note (Signed)
 OPERATIVE NOTE Date of Procedure: 1/30/2026Surgeon: Marinescu, MDFirst Assistant: Leigh, MDSecond Assistant: Earline, MD?PREOPERATIVE DIAGNOSIS: 1. Patient requests termination of pregnancy for her mental/emotional health2. Intrauterine pregnancy at 15 weeks 2 days gestation. 3. Patient requests Depo for contraception4. BMI 355. History of PPH6. History of CS x27. History of abdominoplasty?POSTOPERATIVE DIAGNOSIS: 1. Patient requests termination of pregnancy for her mental/emotional health2. Intrauterine pregnancy at 15 weeks 2 days gestation. 3. Patient requests Depo for contraception4. BMI 355. History of PPH6. History of CS x27. History of abdominoplasty8. Uterine atony?OPERATIVE PROCEDURE: Dilation and evacuation with ultrasound guidance. ANESTHESIA: Deep sedation ESTIMATED BLOOD LOSS: 100 cc URINE OUTPUT: N/A SPECIMENS: Products of conception PREOPERTIVE MEDS: Prophylactic preoperative Doxycycline  200 mg IV, Toradol  30 mg IV?POSTOPERATIVE MEDICATIONS: Pitocin  30 units in 1 500 cc IVF, Methergine  0.2 mg IM, DepoProvera 150 mg IM, Misoprostol  rectal INDICATIONS FOR THE PROCEDURE: The patient is a 34 y.o. [id number] at approximately 15 weeks 2 days gestation with an unplanned and undesired twin pregnancy who requests termination of pregnancy for her emotional/psychological health. 2 days prior to the procedure, she was seen in the office, counseled and consented. For cervical prep, she received Mifepristone  200 mg PO two days prior to the procedure and she placed Misoprostol  400 mcg vaginally 3 hours preoperatively. She is Rh positive. For postabortal contraception she will use Depo which was given intra-operatively.?INTRAOPERATIVE FINDINGS:Anteverted uterus. All fetal parts accounted for and consistent with estimated gestational age. ?DESCRIPTION OF PROCEDURE: The patient was taken to the operating room, placed in a  supine position, and anesthesia was administered without difficulty. The patient was then placed in the dorsal lithotomy position. The preoperative huddle was performed. The bimanual exam showed an anteverted uterus and internal os dilated to 1cm. The vagina and perineum were then prepped with Chlorhexadine, and the patient was draped in the standard sterile fashion. The surgical pause was performed confirming the patient and procedures to be performed.?The bivalve speculum was placed in the vagina, and the anterior lip of the cervix was grasped with a ring forceps. The paracervical block was placed with a total of 20 cc of 0.5% lidocaine  with 4 units of vasopressin  added. Half of this volume was placed at the 3 o'clock and half at the 9 o'clock positions. The bivalve speculum was then removed, and exchanged for a posterior retractor. The remainder of the procedure was carried out with ultrasound guidance. The cervix was progressively dilated to a # 16 Hegar / #45 Fredirick. The 14-mm suction curette was then introduced into the lower uterine segment, suction applied, and the amniotic fluid evacuated. The suction curette was removed. ?The Sopher forceps were then used to remove all fetal parts of both fetuses in multiple pieces and placenta with several passes. The 14-mm suction curette was reintroduced and a small amount of additional placenta was removed. The 14-mm curette was removed and changed to an 8-mm flexible suction curette, which was passed around the uterus and good grit was felt throughout. A 7-mm curved rigid curette was then introduced and the cornua were confirmed to be empty with good grit appreciated. On ultrasound, the appearance of the uterus was empty. A slow trickle of bleeding was noted from the os and the lower uterine segment was noted to be boggy; thus, the decision was made to proceed with 800mcg rectal misoprostol  & 0.2mg  IM methergine  in addition to prophylactic pitocin . Two  minutes of bimanual massage was performed with good tone appreciated. Minimal bleeding thereafter.?Sponge, needle and instrument counts were correct times two  at the end of the procedure. The products of conception were inspected; all fetal parts were accounted for and consistent with gestational age. The patient tolerated the procedure well. The patient was cleansed and dried and taken out of lithotomy position and returned to the supine position. Anesthesia was reversed and she was taken to the recovery room in good condition. ?DISPOSITION: Stable and satisfactory to PACU?Dr. Windell (supervising attending) was present for the entire procedure. Damien Albino, MDOBGYN PGY-2

## 2025-01-13 NOTE — Anesthesia Postprocedure Evaluation (Signed)
 Anesthesia Post-Op NotePatient: Edla MooreProcedure(s) Performed:Procedure SummaryDate:01/13/2025 Anesthesia Start: 01/13/2025  7:34 AM Anesthesia Stop: 01/13/2025  8:39 AM Room / Location:[id number] / [facility] MAIN OR Procedure(s):DILATION AND CURETTAGE, UTERUS, USING SUCTIONULTRASOUND, DIAGNOSTIC Diagnosis:Second trimester abortion [Z33.2][redacted] weeks gestation of pregnancy [Z3A.13] Surgeon(s):Marinescu, Lurene Shorter, MDFronk, Damien, MDHill, Lucie, MD Responsible Anesthesia Provider:Ingris Pasquarella, LLANA SAILOR, MD Recovery Vitals:BP: 117/90 (01/13/2025 11:00 AM)Heart Rate: 89 (01/13/2025  9:30 AM)Heart Rate (via Pulse Ox): 100 (01/13/2025 11:00 AM)Resp: 16 (01/13/2025 11:00 AM)Temp: 36.7 C (98.1 F) (01/13/2025 10:30 AM)SpO2: 100 % (01/13/2025 11:00 AM)O2 Flow Rate: 2 L/min (01/13/2025  8:45 AM)0-10  Pain Scale: 8 (01/13/2025 11:00 AM)Anesthesia type:generalComplications Noted During Procedure or in PACU:None Comment:  Patient Location:PACULevel of Consciousness:  Recovered to baseline, awake, alert and orientedPatient Participation:   Able to participateTemperature Status:  NormothermicOxygen Saturation:  Within patient's normal rangeCardiac Status: within patient's normal rangeFluid Status:  StableAirway Patency:   YesPulmonary Status:  Baseline and stablePain Management:  Adequate analgesiaNausea and Vomiting:None  Post Op Assessment:  Tolerated procedure wellResponsible Anesthesia Provider Attestation:All indicated post anesthesia care provided

## 2025-01-13 NOTE — DOS Update (DOS) (Signed)
 Day of Service Surgeon/Proceduralist Documentation for [facility]'sProcedure(s) (LRB):DILATION AND CURETTAGE, UTERUS, USING SUCTION (N/A)Documentation related to      History of Present Illness/Indication for Procedure      Review of Systems Specific to Indicated Procedure/Operative Site      Exam Specific to Indicated Procedure/Operative Site      Planned Procedurecan be found in document listed below or in Procedure Pass Linked Documents report. Preop H&P by Austin Hastings, MD on 01/11/2025  9:30 AMBy signing this note, I attest that the information documented above is correct, reflecting the current physical state of the patient today, and that it is appropriate to proceed with the planned surgery/procedure.Kenneshia Rehm, MDMaternal Fetal Medicine

## 2025-01-13 NOTE — Anesthesia Preprocedure Evaluation (Addendum)
 Anesthesia Pre-operative History and Physical for Madison MooreHighlighted Issues for this Procedure:33 y.o. female with Second trimester abortion [Z33.2][redacted] weeks gestation of pregnancy [Z3A.13] presenting for Procedure(s):DILATION AND CURETTAGE, UTERUS, USING SUCTION by Surgeon(s):Marinescu, Lurene Shorter, MD scheduled for 45 minutes..Anesthesia Evaluation Information Source: patient, records   ANESTHESIA HISTORYPertinent(-):  No History of anesthetic complications or Family hx of anesthetic complicationsGENERAL  + Obesity        central, upper body, lower bodyPertinent (-):  No history of anesthetic complications or Family Hx of Anesthetic ComplicationsHEENTPertinent (-):  No visual impairment, hearing loss or hoarseness PULMONARYPertinent(-):  No smoking, shortness of breath, recent URI or cough/congestionCARDIOVASCULARExcellent(10+METs) Exercise ToleranceGI/HEPATIC/RENAL NPO: > 8hrs ago (solids) and > 2hrs ago (clears)  + Renal Issues        hx of kidney stonesPertinent(-):  No GERD, nausea or vomiting Comment: Gravid uterus - Dichorionic diamniotic  NEURO/PSYCH/ORTHO  + HeadachesPertinent(-):  No neuropsychiatric issuesENDO/OTHERPertinent(-):  No diabetes mellitusHEMATOLOGIC  + Blood Transfusion  + Blood dyscrasia        anemia and Sickle cell trait Physical ExamAirway          Mouth opening: normal          Mallampati: II          TM distance (fb): >3 FB          TM distance (cm): >5          Neck ROM: fullDental   Normal Exam Cardiovascular         Rhythm: regular         Rate: normalVascular Access          > 1 PIVNeurologic    Normal ExamGeneral Survey    Normal Exam Pulmonary   breath sounds clear to auscultationMental Status   oriented to person, place and time ________________________________________________________________________PLANASA Score   3Anesthetic Plan general Induction (routine IV and RSI) General Anesthesia/Sedation Maintenance Plan (propofol  infusion, IV bolus and neuromuscular blockade for intubation only);  Airway Manipulation (direct laryngoscopy and video laryngoscope); Airway (mask- Back up ett); Line ( use current access); Monitoring (standard ASA); Positioning (supine); PONV Plan (dexamethasone and ondansetron ); Pain (per surgical team); PostOp (PACU)Standard AttestationInformed Consent   Risks:        Risks discussed were commensurate with the plan listed above with the following specific points: aspiration, N/V and sore throat, Damage to: eyes and teeth, allergic Rx, unexpected serious injury and awareness.  Anesthetic Consent:       Anesthetic plan (and risks as noted above) were discussed with patient  Blood products Consent:      Use of blood products discussed with: patient and they consented  Plan also discussed with team members including:     CRNA and attendingResponsible Anesthesia Provider Attestation:I attest that the patient or proxy understands and accepts the risks and benefits of the anesthesia plan. I also attest that I have personally performed a pre-anesthetic examination and evaluation, and prescribed the anesthetic plan for this particular location within 48 hours prior to the anesthetic as documented. Mancel Lardizabal LOISE Ricker, MD  01/13/25, 8:07 AM

## 2025-01-13 NOTE — Telephone Encounter (Signed)
"  ° °  Pre-operative Risk Assessment    Patient Name: Robin Gallegos  DOB: February 09, 1991 MRN: [id number]   Date of last office visit: 10/06/2023  With Dr. Rolan Date of next office visit: None  Request for Surgical Clearance    Procedure:  Breast reduction  Date of Surgery:  Clearance TBD                                 Surgeon:  Dr. Leonce [place] Surgeon's Group or Practice Name:  [facility]  Phone number:  [phone] Fax number:  [phone]   Type of Clearance Requested:   - Medical    Type of Anesthesia:  General    Additional requests/questions:  Length of surgery 3 hours  Signed, Patrcia Iverson CROME   01/13/2025, 8:03 AM   "

## 2025-01-13 NOTE — Telephone Encounter (Signed)
 Pt is followed by our heart failure clinic. I will send a message to scheduling team for our heart failure clinic to call pt with an appt for preop clearance.

## 2025-01-16 ENCOUNTER — Encounter: Payer: Self-pay | Admitting: Obstetrics and Gynecology

## 2025-01-17 NOTE — Therapy (Signed)
 " OUTPATIENT PHYSICAL THERAPY CERVICAL/THORACIC/LUMBAR EVALUATION   Patient Name: Robin Gallegos MRN: [id number] DOB:09-09-1991, 34 y.o., female Today's Date: 01/18/2025  END OF SESSION:  PT End of Session - 01/18/25 1031     Visit Number 1    Date for Recertification  03/15/25    Authorization Type [employer]BS no auth required    PT Start Time 1027    PT Stop Time 1110    PT Time Calculation (min) 43 min    Activity Tolerance Patient tolerated treatment well          Past Medical History:  Diagnosis Date   CHF (congestive heart failure) (HCC)    Pre-eclampsia    History reviewed. No pertinent surgical history. Patient Active Problem List   Diagnosis Date Noted   Hypertensive disorder 05/21/2023   Cardiomyopathy (HCC) 09/19/2020   Class 1 obesity 09/19/2020   CHF (congestive heart failure) (HCC) 10/04/2019   Pregnancy 02/16/2019    PCP: [facility]  REFERRING PROVIDER: Waddell Mace MD  REFERRING DIAG: N62 macromastia, I50.22 chronic systolic CHF; M54.6 bil thoracic back pain M54.50, M54.6 back pain of thoracolumbar region  N64.89 breast asymmetry  THERAPY DIAG:  Neck pain; back pain; postural deformity Rationale for Evaluation and Treatment: Rehabilitation  ONSET DATE: worsened in the last few years  SUBJECTIVE:                                                                                                                                                                                                         SUBJECTIVE STATEMENT: Upper back pain where straps are and mid back for years; worsened pain in last few years and after pregnancy.  Also with history of scoliosis and lumbar herniated disc.  Hasn't exercised/gone to the gym since she had her daughter 5 years ago   PERTINENT HISTORY:  CHF; cardiomyopathy; HTN; scoliosis diagnosis in high school; herniated disc lumbar   PAIN:   Are you having pain? Yes NPRS scale: 8.5/10 Pain location: upper traps,  mid thoracic Pain orientation: Bilateral  PAIN TYPE: aching Pain description: constant  Aggravating factors: bending hurts lower part of back, not wearing bra; pain throughout the day Relieving factors: cracking my back and readjusting my back   PRECAUTIONS: None   WEIGHT BEARING RESTRICTIONS: No  FALLS:  Has patient fallen in last 6 months? No   OCCUPATION: stands all day animal nutritionist at [employer]  PLOF: Independent  PATIENT GOALS: relief for home and work   OBJECTIVE:  Note: Objective measures were completed at Evaluation unless otherwise noted.  PATIENT SURVEYS:  NDI:  NECK DISABILITY INDEX  Date: 2/4 Score  Pain intensity 4 = The pain is very severe at the moment  2. Personal care (washing, dressing, etc.) 1 =  I can look after myself normally but it causes extra pain  3. Lifting 2 = Pain prevents me lifting heavy weights off the floor, but I can manage if they are  conveniently placed, for example on a table  4. Reading 1 = I can read as much as I want to with slight pain in my neck  5. Headaches 3 = I have moderate headaches, which come frequently  6. Concentration 0 =  I can concentrate fully when I want to with no difficulty  7. Work 3 =  I cannot do my usual work  8. Driving 2 =  I can drive my car as long as I want with moderate pain in my neck  9. Sleeping 4 = My sleep is greatly disturbed (3-5 hrs sleepless)   10. Recreation 3 = I am able to engage in a few of my usual recreation activities because of pain in   my neck  Total 46%   Minimum Detectable Change (90% confidence): 5 points or 10% points  COGNITION: Overall cognitive status: Within functional limits for tasks assessed   POSTURE: left shoulder higher. Left iliac crest higher; right thoracic scoliosis; forward head and rounded shoulders in sitting   CERVICAL ROM:   Active ROM A/PROM (deg) eval  Flexion 30  Extension 50  Right lateral flexion 53  Left lateral flexion 43  Right rotation 65   Left rotation 60   (Blank rows = not tested)    LUMBAR ROM:   Active  A/PROM  eval  Flexion Full with right scoliosis noted  Extension 25% limited  Right lateral flexion WFLs  Left lateral flexion WFLs  Right rotation   Left rotation    (Blank rows = not tested)   TRUNK STRENGTH:  Decreased activation of transverse abdominus muscles; abdominals 4-/5; decreased activation of lumbar multifidi; trunk extensors 4-/5   UPPER EXTREMITY ROM: Full with mild shortening of pectorals bil  UPPER EXTREMITY MMT: MIDDLE AND LOWER TRAP STRENGTH 4/5  FUNCTIONAL TESTS:  Able to rise without UE assist   TREATMENT DATE: 01/18/2025           evaluation                                                                                                                       PATIENT EDUCATION:  Education details: Educated patient on anatomy and physiology of current symptoms, prognosis, plan of care as well as initial self care strategies to promote recovery Person educated: Patient Education method: Explanation Education comprehension: verbalized understanding  HOME EXERCISE PROGRAM:  URL: [email or web address] Date: 01/18/2025 Prepared by: Glade Pesa  Exercises- post op macromastia handout - Seated Correct Posture  - 1 x daily - 7 x weekly - 1 reps - Seated Diaphragmatic Breathing  -  3 x daily - 7 x weekly - 1 sets - 10 reps - Standing Diaphragmatic Breathing  - 3 x daily - 7 x weekly - 1 sets - 10 reps - Standing Shoulder Flexion Wall Walk  - 3 x daily - 7 x weekly - 1 sets - 10 reps - 5 hold - Standing Shoulder Abduction Finger Walk at Wall  - 3 x daily - 7 x weekly - 1 sets - 10 reps - 5 hold - Seated Thoracic Lumbar Extension with Pectoralis Stretch  - 3 x daily - 7 x weekly - 1 sets - 5 reps - 10 hold - Chest and Bicep Stretch - Arms Behind Back  - 3 x daily - 7 x weekly - 1 sets - 5 reps - 10 hold - Seated Scapular Retraction  - 3 x daily - 7 x weekly - 1 sets - 10  reps - 5 hold  ASSESSMENT:  CLINICAL IMPRESSION: Patient is a 34 y.o. female who was seen today for physical therapy evaluation and treatment for upper and mid back pain associated with macromastia. Pt is considering breast reduction surgery. Pain in the upper and mid back is severe at 8.5/10 and is constant and aching.  Pt with forward head and rounded shoulder posture in sitting and is able to make corrections with verbal cues.    Pt with limited and painful segmental mobility and muscle tension throughout the cervical and thoracic spine.  PT will address flexibility, strength and post-operative breast reduction education during sessions.  Patient will benefit from skilled PT to address the below impairments and improve overall function.     OBJECTIVE IMPAIRMENTS: decreased activity tolerance, decreased ROM, hypomobility, increased muscle spasms, impaired flexibility, improper body mechanics, postural dysfunction, and pain.    ACTIVITY LIMITATIONS: carrying, sitting, standing, and locomotion level   PARTICIPATION LIMITATIONS: meal prep, cleaning, laundry, driving, community activity, and occupation   PERSONAL FACTORS: Time since onset of injury/illness/exacerbation and comorbidities: scoliosis, chronic LBP, CHF  are also affecting patient's functional outcome.    REHAB POTENTIAL: Good   CLINICAL DECISION MAKING: Stable/uncomplicated   EVALUATION COMPLEXITY: Low     GOALS: Goals reviewed with patient? Yes   SHORT TERM GOALS: Target date: 02/15/2025       Be independent in initial HEP Baseline: Goal status: INITIAL   2.  Report > or = to 30% reduction in  pain to allow for greater ease with home ADLS and work activities Baseline:   Goal status: INITIAL   3.  Verbalize and demonstrate body mechanics modifications for spinal protection with daily tasks  Baseline:  Goal status: INITIAL     LONG TERM GOALS: Target date: 03/15/2025       Be independent in advanced HEP Baseline:   Goal status: INITIAL   2.  Neck Disability Index improved to 34% indicating improved function with less pain Goal status: INITIAL   3.  Report > or = to 50% reduction in thoracic and upper trap pain to allow for home and work ADLS Baseline:  Goal status: INITIAL   4.  Verbalize understanding of post-op breast reduction surgery instructions  Baseline:  Goal status: INITIAL       PLAN:   PT FREQUENCY: 1-2x/week   PT DURATION: 6 weeks   PLANNED INTERVENTIONS: 97110-Therapeutic exercises, 97530- Therapeutic activity, [id number]- Neuromuscular re-education, 97535- Self Care, 02859- Manual therapy, [id number]- Canalith repositioning, [id number]- Aquatic Therapy, H9716- Electrical stimulation (unattended), [id number]- Electrical stimulation (manual), [id number]- Ultrasound, [id number]- Traction (mechanical), [id number] (  1-2 muscles), 20561 (3+ muscles)- Dry Needling, Patient/Family education, Taping, Joint mobilization, Joint manipulation, Spinal manipulation, Spinal mobilization, Cryotherapy, and Moist heat.   PLAN FOR NEXT SESSION: postural and core strength, body mechanics for lifting and sitting, manual to address pain   Glade Pesa, PT 01/18/25 5:04 PM Phone: [phone] Fax: [phone]  "

## 2025-01-18 ENCOUNTER — Ambulatory Visit: Admitting: Physical Therapy

## 2025-01-18 ENCOUNTER — Encounter: Payer: Self-pay | Admitting: Physical Therapy

## 2025-01-18 ENCOUNTER — Other Ambulatory Visit: Payer: Self-pay

## 2025-01-18 DIAGNOSIS — M5459 Other low back pain: Secondary | ICD-10-CM

## 2025-01-18 DIAGNOSIS — M6281 Muscle weakness (generalized): Secondary | ICD-10-CM

## 2025-01-18 NOTE — Telephone Encounter (Signed)
 Will reach out to Heart Failure team to call pt with an appt as preop clearance is needed.

## 2025-01-18 NOTE — Telephone Encounter (Signed)
 I will update the requesting office the pt is needing an appt with Heart Failure Clinic. Message has been sent to the HF Clinic to call pt with an appt. Appt can be with Dr. Rolan or APP in the Heart Failure Clinic.

## 2025-01-19 LAB — SURGICAL PATHOLOGY

## 2025-01-23 ENCOUNTER — Ambulatory Visit: Admitting: Physical Therapy

## 2025-01-25 ENCOUNTER — Ambulatory Visit: Admitting: Physical Therapy

## 2025-01-31 ENCOUNTER — Ambulatory Visit: Admitting: Physical Therapy

## 2025-02-06 ENCOUNTER — Ambulatory Visit: Admitting: Physical Therapy

## 2025-02-08 ENCOUNTER — Ambulatory Visit: Admitting: Physical Therapy
